# Patient Record
Sex: Female | Born: 1960
Health system: Southern US, Community
[De-identification: ages and names within clinical notes are randomized; demographics above are authoritative.]

## PROBLEM LIST (undated history)

## (undated) DIAGNOSIS — J45909 Unspecified asthma, uncomplicated: Secondary | ICD-10-CM

## (undated) DIAGNOSIS — J40 Bronchitis, not specified as acute or chronic: Secondary | ICD-10-CM

## (undated) DIAGNOSIS — J302 Other seasonal allergic rhinitis: Secondary | ICD-10-CM

## (undated) DIAGNOSIS — G43909 Migraine, unspecified, not intractable, without status migrainosus: Secondary | ICD-10-CM

## (undated) HISTORY — DX: Bronchitis, not specified as acute or chronic: J40

## (undated) HISTORY — DX: Other seasonal allergic rhinitis: J30.2

## (undated) HISTORY — DX: Unspecified asthma, uncomplicated: J45.909

## (undated) HISTORY — DX: Migraine, unspecified, not intractable, without status migrainosus: G43.909

---

## 1989-10-16 HISTORY — PX: LAPAROSCOPIC CHOLECYSTECTOMY: SUR755

## 1998-07-01 ENCOUNTER — Emergency Department (HOSPITAL_COMMUNITY): Admission: EM | Admit: 1998-07-01 | Discharge: 1998-07-01 | Payer: Self-pay | Admitting: Emergency Medicine

## 2000-07-30 ENCOUNTER — Emergency Department (HOSPITAL_COMMUNITY): Admission: EM | Admit: 2000-07-30 | Discharge: 2000-07-31 | Payer: Self-pay | Admitting: *Deleted

## 2000-10-16 HISTORY — PX: TUBAL LIGATION: SHX77

## 2004-05-30 ENCOUNTER — Emergency Department (HOSPITAL_COMMUNITY): Admission: EM | Admit: 2004-05-30 | Discharge: 2004-05-30 | Payer: Self-pay | Admitting: Emergency Medicine

## 2004-11-23 ENCOUNTER — Ambulatory Visit: Payer: Self-pay | Admitting: Obstetrics and Gynecology

## 2004-12-27 ENCOUNTER — Ambulatory Visit: Payer: Self-pay | Admitting: Family Medicine

## 2005-04-12 ENCOUNTER — Ambulatory Visit: Payer: Self-pay | Admitting: Family Medicine

## 2005-05-08 ENCOUNTER — Ambulatory Visit: Payer: Self-pay | Admitting: Family Medicine

## 2005-10-17 ENCOUNTER — Ambulatory Visit: Payer: Self-pay | Admitting: Family Medicine

## 2005-10-31 ENCOUNTER — Emergency Department (HOSPITAL_COMMUNITY): Admission: EM | Admit: 2005-10-31 | Discharge: 2005-10-31 | Payer: Self-pay | Admitting: Family Medicine

## 2005-11-20 ENCOUNTER — Ambulatory Visit: Payer: Self-pay | Admitting: Family Medicine

## 2006-02-12 ENCOUNTER — Ambulatory Visit: Payer: Self-pay | Admitting: Family Medicine

## 2006-02-22 ENCOUNTER — Ambulatory Visit: Payer: Self-pay | Admitting: Obstetrics and Gynecology

## 2006-06-14 ENCOUNTER — Ambulatory Visit: Payer: Self-pay | Admitting: Family Medicine

## 2006-06-18 ENCOUNTER — Emergency Department: Payer: Self-pay | Admitting: Emergency Medicine

## 2006-11-06 ENCOUNTER — Ambulatory Visit: Payer: Self-pay | Admitting: Internal Medicine

## 2006-12-18 ENCOUNTER — Ambulatory Visit: Payer: Self-pay | Admitting: Family Medicine

## 2007-05-10 DIAGNOSIS — G43909 Migraine, unspecified, not intractable, without status migrainosus: Secondary | ICD-10-CM | POA: Insufficient documentation

## 2007-05-16 ENCOUNTER — Ambulatory Visit: Payer: Self-pay | Admitting: Family Medicine

## 2007-05-16 DIAGNOSIS — E669 Obesity, unspecified: Secondary | ICD-10-CM | POA: Insufficient documentation

## 2007-05-16 DIAGNOSIS — M545 Low back pain, unspecified: Secondary | ICD-10-CM | POA: Insufficient documentation

## 2007-05-16 DIAGNOSIS — F411 Generalized anxiety disorder: Secondary | ICD-10-CM | POA: Insufficient documentation

## 2007-05-21 LAB — CONVERTED CEMR LAB
BUN: 15 mg/dL (ref 6–23)
Chloride: 106 meq/L (ref 96–112)
Cholesterol: 176 mg/dL (ref 0–200)
Creatinine, Ser: 1 mg/dL (ref 0.4–1.2)
LDL Cholesterol: 120 mg/dL — ABNORMAL HIGH (ref 0–99)
Sodium: 140 meq/L (ref 135–145)
Total CHOL/HDL Ratio: 6.2
Triglycerides: 138 mg/dL (ref 0–149)
VLDL: 28 mg/dL (ref 0–40)

## 2008-03-10 ENCOUNTER — Ambulatory Visit: Payer: Self-pay | Admitting: Family Medicine

## 2008-03-10 DIAGNOSIS — S93409A Sprain of unspecified ligament of unspecified ankle, initial encounter: Secondary | ICD-10-CM | POA: Insufficient documentation

## 2008-05-13 ENCOUNTER — Ambulatory Visit: Payer: Self-pay | Admitting: Obstetrics and Gynecology

## 2008-05-25 ENCOUNTER — Telehealth (INDEPENDENT_AMBULATORY_CARE_PROVIDER_SITE_OTHER): Payer: Self-pay | Admitting: Internal Medicine

## 2008-05-25 ENCOUNTER — Ambulatory Visit: Payer: Self-pay | Admitting: Obstetrics and Gynecology

## 2009-01-29 ENCOUNTER — Encounter (INDEPENDENT_AMBULATORY_CARE_PROVIDER_SITE_OTHER): Payer: Self-pay | Admitting: Internal Medicine

## 2009-01-29 ENCOUNTER — Ambulatory Visit: Payer: Self-pay | Admitting: Family Medicine

## 2009-01-29 ENCOUNTER — Encounter: Payer: Self-pay | Admitting: Family Medicine

## 2009-01-29 DIAGNOSIS — L989 Disorder of the skin and subcutaneous tissue, unspecified: Secondary | ICD-10-CM | POA: Insufficient documentation

## 2009-01-29 DIAGNOSIS — L919 Hypertrophic disorder of the skin, unspecified: Secondary | ICD-10-CM

## 2009-01-29 DIAGNOSIS — L909 Atrophic disorder of skin, unspecified: Secondary | ICD-10-CM | POA: Insufficient documentation

## 2009-04-13 ENCOUNTER — Ambulatory Visit: Payer: Self-pay | Admitting: Family Medicine

## 2009-04-13 DIAGNOSIS — N3946 Mixed incontinence: Secondary | ICD-10-CM | POA: Insufficient documentation

## 2009-04-14 LAB — CONVERTED CEMR LAB
ALT: 22 units/L (ref 0–35)
Albumin: 3.8 g/dL (ref 3.5–5.2)
Alkaline Phosphatase: 50 units/L (ref 39–117)
BUN: 15 mg/dL (ref 6–23)
Basophils Relative: 0.3 % (ref 0.0–3.0)
Bilirubin, Direct: 0 mg/dL (ref 0.0–0.3)
CO2: 30 meq/L (ref 19–32)
Calcium: 9.3 mg/dL (ref 8.4–10.5)
Cholesterol: 166 mg/dL (ref 0–200)
Creatinine, Ser: 0.9 mg/dL (ref 0.4–1.2)
Eosinophils Relative: 4.2 % (ref 0.0–5.0)
GFR calc non Af Amer: 71.44 mL/min (ref >60)
Glucose, Bld: 93 mg/dL (ref 70–99)
HCT: 41.2 % (ref 36.0–46.0)
Hemoglobin: 14.3 g/dL (ref 12.0–15.0)
Lymphs Abs: 2.4 10*3/uL (ref 0.7–4.0)
MCHC: 34.7 g/dL (ref 30.0–36.0)
MCV: 94.1 fL (ref 78.0–100.0)
Monocytes Absolute: 0.9 10*3/uL (ref 0.1–1.0)
Neutrophils Relative %: 58.1 % (ref 43.0–77.0)
RBC: 4.38 M/uL (ref 3.87–5.11)
Sodium: 141 meq/L (ref 135–145)
Total Bilirubin: 0.8 mg/dL (ref 0.3–1.2)
Triglycerides: 148 mg/dL (ref 0.0–149.0)

## 2009-04-22 ENCOUNTER — Telehealth (INDEPENDENT_AMBULATORY_CARE_PROVIDER_SITE_OTHER): Payer: Self-pay | Admitting: Internal Medicine

## 2009-06-10 LAB — CONVERTED CEMR LAB: Pap Smear: NORMAL

## 2009-07-09 ENCOUNTER — Ambulatory Visit: Payer: Self-pay | Admitting: Family Medicine

## 2009-07-09 DIAGNOSIS — J4541 Moderate persistent asthma with (acute) exacerbation: Secondary | ICD-10-CM | POA: Insufficient documentation

## 2009-07-29 ENCOUNTER — Ambulatory Visit: Payer: Self-pay | Admitting: Obstetrics and Gynecology

## 2009-08-12 ENCOUNTER — Ambulatory Visit: Payer: Self-pay | Admitting: Obstetrics and Gynecology

## 2009-08-13 ENCOUNTER — Ambulatory Visit: Payer: Self-pay | Admitting: Obstetrics and Gynecology

## 2009-08-17 ENCOUNTER — Ambulatory Visit: Payer: Self-pay | Admitting: Family Medicine

## 2009-08-20 ENCOUNTER — Encounter (INDEPENDENT_AMBULATORY_CARE_PROVIDER_SITE_OTHER): Payer: Self-pay | Admitting: Internal Medicine

## 2009-11-19 ENCOUNTER — Telehealth (INDEPENDENT_AMBULATORY_CARE_PROVIDER_SITE_OTHER): Payer: Self-pay

## 2010-04-07 ENCOUNTER — Telehealth (INDEPENDENT_AMBULATORY_CARE_PROVIDER_SITE_OTHER): Payer: Self-pay | Admitting: *Deleted

## 2010-04-08 ENCOUNTER — Ambulatory Visit: Payer: Self-pay | Admitting: Family Medicine

## 2010-04-08 LAB — CONVERTED CEMR LAB
BUN: 19 mg/dL (ref 6–23)
CO2: 28 meq/L (ref 19–32)
Calcium: 9.1 mg/dL (ref 8.4–10.5)
Cholesterol: 182 mg/dL (ref 0–200)
Creatinine, Ser: 0.8 mg/dL (ref 0.4–1.2)
GFR calc non Af Amer: 81.49 mL/min (ref >60)
Glucose, Bld: 90 mg/dL (ref 70–99)
HDL: 40.9 mg/dL (ref >39.00)
Sodium: 142 meq/L (ref 135–145)
Total CHOL/HDL Ratio: 4
VLDL: 28.8 mg/dL (ref 0.0–40.0)

## 2010-04-11 ENCOUNTER — Ambulatory Visit: Payer: Self-pay | Admitting: Family Medicine

## 2010-08-17 ENCOUNTER — Ambulatory Visit: Payer: Self-pay | Admitting: Family Medicine

## 2010-08-17 DIAGNOSIS — M79609 Pain in unspecified limb: Secondary | ICD-10-CM | POA: Insufficient documentation

## 2010-10-06 ENCOUNTER — Telehealth: Payer: Self-pay | Admitting: Family Medicine

## 2010-10-18 ENCOUNTER — Encounter: Payer: Self-pay | Admitting: Family Medicine

## 2010-11-15 NOTE — Assessment & Plan Note (Signed)
Summary: R LEG PAIN,BRUISED/CLE   Vital Signs:  Patient profile:   50 year old female Height:      65 inches Weight:      225.50 pounds BMI:     37.66 Temp:     98.7 degrees F oral Pulse rate:   76 / minute Pulse rhythm:   regular BP sitting:   102 / 70  (right arm) Cuff size:   large  Vitals Entered By: Linde Gillis CMA Duncan Dull) (August 17, 2010 3:33 PM) CC: right leg pain due to a fall, bruised   History of Present Illness: "I fell".  Saturday AM  ~6:30.  Was leaving a service station and when to ground.  Pain started at lunchtime after walking for a while.  No LOC.  Remembers the fall.  Seen at [facility] in [facility]  Pain in R hip and down R leg.  H/o sciatica and this is similar to before.  Can walk but with pain.  No weakness. Was xrays at clinic in [facility] but I don't have records from that.  Had been using vicodin.  1 didn't help but 2 makes her sleepy.    In school at [employer].  Needs note.   Allergies: 1)  ! Codeine Sulfate (Codeine Sulfate)  Review of Systems       See HPI.  Otherwise negative.    Physical Exam  General:  NAD RRR CTAB back w/o midline pain.  R lower lumbar paraspinal area tender to palpation R SLR pos but distally nv intact R knee puffy with bruising but ligamentously intact.  diffuse bruising on R shin but not tender to palpation  no weakness on testing R leg.    Impression & Recommendations:  Problem # 1:  LEG PAIN, RIGHT (ICD-729.5) Likely sciatica from fall/compensation and concurrent bruising/patellar bursitis.  D/w patient re: cont crutches and note given.  Ambulate as pain allows.  use 1.5 vicodin and follow up as needed.  Bruising will likely extend into the foot due to gravity over the next few days.  patient informed and understands.   Complete Medication List: 1)  Imitrex 100 Mg Tabs (Sumatriptan succinate) .... Take one by mouth as needed 2)  Zoloft 50 Mg Tabs (Sertraline hcl) .Marland Kitchen.. 1daily by mouth 3)  Oxybutynin Chloride 5 Mg Tabs  (Oxybutynin chloride) .... Take 1 two times a day for urinary incontinence 4)  Proventil Hfa 108 (90 Base) Mcg/act Aers (Albuterol sulfate) .... Two puffs as needed 5)  Proair Hfa 108 (90 Base) Mcg/act Aers (Albuterol sulfate) .Marland Kitchen.. 1-2 puffs every 4 hrs as needed wheezing or chest tightness 6)  Flovent Diskus 100 Mcg/blist Aepb (Fluticasone propionate (inhal)) .Marland Kitchen.. 1 inh twice daily. 7)  Vicodin 5-500 Mg Tabs (Hydrocodone-acetaminophen) .... 1.5 tabs by mouth three times a day as needed for pain  Patient Instructions: 1)  Take the vicodin (1.5 tabs) three times a day for pain.  It can may you drowsy.  This should gradually get better.  Gradually wean off the crutches.  Take care.  Prescriptions: VICODIN 5-500 MG TABS (HYDROCODONE-ACETAMINOPHEN) 1.5 tabs by mouth three times a day as needed for pain  #30 x 1   Entered and Authorized by:   Crawford Givens MD   Signed by:   Crawford Givens MD on 08/17/2010   Method used:   Print then Give to Patient   RxID:   [id number]    Orders Added: 1)  Est. Patient Level III [308[address]]    Current Allergies (  reviewed today): ! CODEINE SULFATE (CODEINE SULFATE)

## 2010-11-15 NOTE — Letter (Signed)
Summary: Out of School  [place] at [facility]  [address] [place], [place] [address]   Phone: [phone]  Fax: [phone]    August 17, 2010   Student:  ALEAN KROMER    To Whom It May Concern:   For Medical reasons, please all Rahaf Eastham to use crutches.  Please allow for extra time between classes as needed.    If you need additional information, please feel free to contact our office.   Sincerely,    Crawford Givens MD    ****This is a legal document and cannot be tampered with.  Schools are authorized to verify all information and to do so accordingly.

## 2010-11-15 NOTE — Progress Notes (Signed)
----   Converted from flag ---- ---- 04/07/2010 11:41 AM, Ruthe Mannan MD wrote: BMET (v70.0), fasting lipid panel (v77.91)  ---- 04/07/2010 11:12 AM, Mills Koller wrote: Patient scheduled for cpx labs, Thanks, Terri ( Billie's pt) ------------------------------

## 2010-11-15 NOTE — Progress Notes (Signed)
Summary: head and sinus congestion  Phone Note Call from Patient Call back at [phone]   Caller: Patient Call For: Dr Dayton Martes Summary of Call: On 11/18/09 pt began with sinus and head congestion, dry cough, no fever, no SOB, no wheezing or no trouble getting breath. Pt said Sudafed  had not helped in the past. Pt has taken Chlortrimeton before and that seemed to help. Pt will try Chlortrimeton, Robitussin, and a cool vapor humidifier. Pt will encourage fluids and rest. Pt is eating and drinking normally. If condition changes or worsens will call back and info on [facility] given if needed. Pt was comfortable with this plan of action at this time. Initial call taken by: Lewanda Rife LPN,  November 19, 2009 1:12 PM

## 2010-11-15 NOTE — Assessment & Plan Note (Signed)
Summary: CPX /[facility] BILLIES PATIENT/[facility]   Vital Signs:  Patient profile:   50 year old female Height:      65 inches Weight:      218 pounds BMI:     36.41 Temp:     98.6 degrees F oral Pulse rate:   72 / minute Pulse rhythm:   regular BP sitting:   104 / 70  (right arm) Cuff size:   large  Vitals Entered By: Lowella Petties CMA (April 11, 2010 8:32 AM) CC: Transfer from [place]- physical   History of Present Illness: 50 yo here for CPX.  Yearly Pap and mammogram done with Dr. Logan Bores, [facility]. Due at the end of next month.  Lipid panel this week was good.  She is trying to walk at least once a day.  Asthma- has deteriorated.  Now having to use her rescue inhaler 3-4 times a day.  Was on an inhaled steroid years ago.  She feels her symptoms get worse this time of year, every year.  Asthma has been deteriorating for last several months. No fevers, chest pain or sputum production. Wheezing throughout the day, coughing at night.  mixed urinary incontinence- much improved with oxybutynin 5 mg two times a day.    Current Medications (verified): 1)  Imitrex 100 Mg  Tabs (Sumatriptan Succinate) .... Take One By Mouth As Needed 2)  Zoloft 50 Mg  Tabs (Sertraline Hcl) .Marland Kitchen.. 1daily By Mouth 3)  Oxybutynin Chloride 5 Mg Tabs (Oxybutynin Chloride) .... Take 1 Two Times A Day For Urinary Incontinence 4)  Proventil Hfa 108 (90 Base) Mcg/act Aers (Albuterol Sulfate) .... Two Puffs As Needed 5)  Proair Hfa 108 (90 Base) Mcg/act Aers (Albuterol Sulfate) .Marland Kitchen.. 1-2 Puffs Every 4 Hrs As Needed Wheezing or Chest Tightness 6)  Flovent Diskus 100 Mcg/blist Aepb (Fluticasone Propionate (Inhal)) .Marland Kitchen.. 1 Inh Twice Daily.  Allergies (verified): 1)  ! Codeine Sulfate (Codeine Sulfate)  Past History:  Family History: Last updated: 05/16/2007 Father: Alive ? Mother: Alive, Elevated BP, Diverticulitis, new dx 7/08--fibromyalgia, and esophagus problem Siblings: Two stepbrothers, L & W, one full sister- L  & W- uterine cancer MGM died at age 46 MI, asthma, emphysema (smoked) MGF alive L & W PG parents not known CV- Mat. 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 Eyes:  Denies blurring. ENT:  Denies difficulty swallowing. CV:  Denies chest pain or discomfort and palpitations. Resp:  Complains of cough and wheezing; denies sputum productive. GI:  Denies abdominal pain, bloody stools, and change in  bowel habits. GU:  Denies abnormal vaginal bleeding, discharge, dysuria, urinary frequency, and urinary hesitancy. MS:  Denies joint pain, joint redness, and joint swelling. Derm:  Denies rash. Neuro:  Denies headaches. Psych:  Denies anxiety, depression, sense of great danger, suicidal thoughts/plans, thoughts of violence, unusual visions or sounds, and thoughts /plans of harming others. Endo:  Denies cold intolerance and heat intolerance. Heme:  Denies abnormal bruising and bleeding. Allergy:  Denies persistent infections.  Physical Exam  General:  alert, well-developed, well-nourished, well-hydrated, and overweight-appearing.  NAD sitting in exam room Head:  normocephalic.   Eyes:  pupils equal, pupils round, and no injection.   Ears:  External ear exam shows no significant lesions or deformities.  Otoscopic examination reveals clear canals, tympanic  membranes are intact bilaterally without bulging, retraction, inflammation or discharge. Hearing is grossly normal bilaterally. Nose:  External nasal examination shows no deformity or inflammation. Nasal mucosa are pink and moist without lesions or exudates. Mouth:  Oral mucosa and oropharynx without lesions or exudates.  Teeth in good repair. Neck:  no masses, no thyromegaly, no JVD, and no carotid bruits.   Lungs:  scattered wheezes throughout lung fields--move wiht cough--non-productive, wheezes heard at end of inspiration Heart:  normal rate, regular rhythm, and no murmur.   Abdomen:  obese, soft, non-tender, normal bowel sounds, no distention, no masses, no guarding, no abdominal hernia, no inguinal hernia, no hepatomegaly, and no splenomegaly.   Genitalia:  deferred Msk:  no joint swelling, no joint warmth, no redness over joints, and no joint deformities.   Extremities:  no pretibial edema bilat Neurologic:  alert & oriented X3 and gait normal.   Skin:  turgor normal, color normal, and no rashes.   Psych:  normally interactive and good eye contact.     Impression & Recommendations:  Problem # 1:  WELL WOMAN (ICD-V70.0) Reviewed preventive care protocols, scheduled due services, and updated immunizations Discussed nutrition, exercise, diet, and healthy lifestyle.  UTD on prevention.  Problem # 2:  ASTHMA (ICD-493.90) Assessment: Deteriorated Will add flovent to her rescue inhaler.  Pt to call me in 2-3 weeks (she is going out of town) with an update of how she is feeling. Her updated medication list for this problem includes:    Proventil Hfa 108 (90 Base) Mcg/act Aers (Albuterol sulfate) .Marland Kitchen..Marland Kitchen Two puffs as needed    Proair Hfa 108 (90 Base) Mcg/act Aers (Albuterol sulfate) .Marland Kitchen... 1-2 puffs every 4 hrs as needed wheezing or chest tightness    Flovent Diskus 100 Mcg/blist Aepb (Fluticasone propionate (inhal)) .Marland Kitchen... 1 inh twice daily.  Complete Medication List: 1)  Imitrex 100 Mg  Tabs (Sumatriptan succinate) .... Take one by mouth as needed 2)  Zoloft 50 Mg Tabs (Sertraline hcl) .Marland Kitchen.. 1daily by mouth 3)  Oxybutynin Chloride 5 Mg Tabs (Oxybutynin chloride) .... Take 1 two times a day for urinary incontinence 4)  Proventil Hfa 108 (90 Base) Mcg/act Aers (Albuterol sulfate) .... Two puffs as needed 5)  Proair Hfa 108 (90 Base) Mcg/act Aers (Albuterol sulfate) .Marland Kitchen.. 1-2 puffs every 4 hrs as needed wheezing or chest tightness 6)  Flovent Diskus 100 Mcg/blist Aepb (Fluticasone propionate (inhal)) .Marland Kitchen.. 1 inh twice daily.  Patient Instructions: 1)  Great to meet you. 2)  Let me know how you are doing with the flovent. Prescriptions: FLOVENT DISKUS 100 MCG/BLIST AEPB (FLUTICASONE PROPIONATE (INHAL)) 1 inh twice daily.  #11 x 0   Entered and Authorized by:   Ruthe Mannan MD   Signed  by:   Ruthe Mannan MD on 04/11/2010   Method used:   Samples Given   RxID:   [id number] OXYBUTYNIN CHLORIDE 5 MG TABS (OXYBUTYNIN CHLORIDE) take 1 two times a day for urinary incontinence  #60 x 12   Entered and Authorized by:   Ruthe Mannan MD   Signed by:   Ruthe Mannan MD on 04/11/2010   Method used:   Electronically to        [facility]* (retail)       [address]       [place], [place]  [address]       Ph: [phone]       Fax: [phone]   RxID:   [id number] ZOLOFT 50 MG  TABS (SERTRALINE HCL) 1daily by mouth  #30 x 11   Entered and Authorized by:   Ruthe Mannan MD   Signed by:   Ruthe Mannan MD on 04/11/2010   Method used:   Electronically to        [facility]* (retail)       [address]       [place], [place]  [address]       Ph: [phone]       Fax: [phone]   RxID:   [id number]   Prior Medications: IMITREX 100 MG  TABS (SUMATRIPTAN SUCCINATE) Take one by mouth as needed ZOLOFT 50 MG  TABS (SERTRALINE HCL) 1daily by mouth OXYBUTYNIN CHLORIDE 5 MG TABS (OXYBUTYNIN CHLORIDE) take 1 two times a day for urinary incontinence PROVENTIL HFA 108 (90 BASE)  MCG/ACT AERS (ALBUTEROL SULFATE) two puffs as needed PROAIR HFA 108 (90 BASE) MCG/ACT AERS (ALBUTEROL SULFATE) 1-2 puffs every 4 hrs as needed wheezing or chest tightness Current Allergies (reviewed today): ! CODEINE SULFATE (CODEINE SULFATE)  PAP Result Date:  06/10/2009 PAP Result:  normal PAP Next Due:  1 yr Mammogram Result Date:  06/10/2009 Mammogram Result:  normal Mammogram Next Due:  1 yr

## 2010-11-17 NOTE — Miscellaneous (Signed)
Summary: Dose change in OXYBUTYNIN  Clinical Lists Changes  Medications: Removed medication of OXYBUTYNIN CHLORIDE 5 MG TABS (OXYBUTYNIN CHLORIDE) take 1 two times a day for urinary incontinence Added new medication of OXYBUTYNIN CHLORIDE 10 MG XR24H-TAB (OXYBUTYNIN CHLORIDE) take 1 by mouth two times a day - Signed Rx of OXYBUTYNIN CHLORIDE 10 MG XR24H-TAB (OXYBUTYNIN CHLORIDE) take 1 by mouth two times a day;  #60 x 12;  Signed;  Entered by: Mervin Hack CMA (AAMA);  Authorized by: Ruthe Mannan MD;  Method used: Electronically to [facility]*, [address], [place], [place]  [address], Ph: [phone], Fax: [phone]    Prescriptions: OXYBUTYNIN CHLORIDE 10 MG XR24H-TAB (OXYBUTYNIN CHLORIDE) take 1 by mouth two times a day  #60 x 12   Entered by:   Mervin Hack CMA (AAMA)   Authorized by:   Ruthe Mannan MD   Signed by:   Mervin Hack CMA (AAMA) on 10/18/2010   Method used:   Electronically to        [facility]* (retail)       [address]       [place], [place]  [address]       Ph: [phone]       Fax: [phone]   RxID:   [id number]

## 2010-11-17 NOTE — Progress Notes (Signed)
Summary: wants to change from oxybutynin  Phone Note Refill Request Message from:  Fax from Pharmacy  Refills Requested: Medication #1:  OXYBUTYNIN CHLORIDE 5 MG TABS take 1 two times a day for urinary incontinence Fax from [facility].  Pt says this is not working and she wants to change to something else.  Initial call taken by: Lowella Petties CMA, AAMA,  October 06, 2010 10:17 AM  Follow-up for Phone Call        She is still on a rather low dose, max is 20 mg per day.  This is as efficacious as other medications.  I would recommend increasing the dose to 10 mg two times a day and referring to urology for further treatment options. Ruthe Mannan MD  October 06, 2010 12:08 PM   Additional Follow-up for Phone Call Additional follow up Details #1::        Advised pt.  She will try this and see how it works. Additional Follow-up by: Lowella Petties CMA, AAMA,  October 06, 2010 12:44 PM

## 2010-11-18 ENCOUNTER — Ambulatory Visit (INDEPENDENT_AMBULATORY_CARE_PROVIDER_SITE_OTHER): Payer: Commercial Indemnity | Admitting: Family Medicine

## 2010-11-18 ENCOUNTER — Encounter: Payer: Self-pay | Admitting: Family Medicine

## 2010-11-18 ENCOUNTER — Ambulatory Visit: Payer: Self-pay | Admitting: Family Medicine

## 2010-11-18 DIAGNOSIS — M79609 Pain in unspecified limb: Secondary | ICD-10-CM

## 2010-11-18 DIAGNOSIS — J45909 Unspecified asthma, uncomplicated: Secondary | ICD-10-CM

## 2010-11-23 NOTE — Assessment & Plan Note (Signed)
Summary: COUGH/CLE   Vital Signs:  Patient profile:   50 year old female Height:      65 inches Weight:      222.50 pounds BMI:     37.16 O2 Sat:      98 % on Room air Temp:     98 degrees F oral Pulse rate:   84 / minute Pulse rhythm:   regular BP sitting:   102 / 72  (left arm) Cuff size:   large  Vitals Entered By: Delilah Shan CMA (AAMA) (November 18, 2010 8:07 AM)  O2 Flow:  Room air CC: Cough   History of Present Illness: H/o asthma.  Noted changed in weather.  During that time cough has increased.  No fevers.  Cough and wheeze worse at night and early AM.  "I don't feel bad except for the cough."  3 weeks duration.  Inc in SABA use (5-6 time a day).  Still using flovent two times a day.  Needs SABA more with exercise.  Nonsmoker.  No ear pain. No rhinorrhea.    If sitting a long time, pain on back on R leg that radiates down.  Recently start of symptoms.  "Lightning bolt."  Better when up and walking.  No recent inury except for a fall months ago.  Sx didn't start at that time.  No weakness in leg.      Allergies: 1)  ! Codeine Sulfate (Codeine Sulfate)  Review of Systems       See HPI.  Otherwise negative.    Physical Exam  General:  no apparent distress normocephalic atraumatic tm wnl x2 nasal and OP exam e/o erythema neck supple regular rate and rhythm clear to auscultation bilaterally, no wheeze, no increase in wob, can have a conversation w/o any effort back w/o midline pain R slr pos, no weakness, dtrs wnl bilaterally   Impression & Recommendations:  Problem # 1:  ASTHMA (ICD-493.90) Peak flow 270 but this was with poor technique, even with coaching.  I don't think this approximates her status- she would very much likely be  higher with better technique.  I would start prednisone, GI and steroid cautions given.  Continue inhalers and follow up as needed.  She agrees.  The following medications were removed from the medication list:    Proventil Hfa  108 (90 Base) Mcg/act Aers (Albuterol sulfate) .Marland Kitchen..Marland Kitchen Two puffs as needed Her updated medication list for this problem includes:    Proair Hfa 108 (90 Base) Mcg/act Aers (Albuterol sulfate) .Marland Kitchen... 1-2 puffs every 4 hrs as needed wheezing or chest tightness    Flovent Diskus 100 Mcg/blist Aepb (Fluticasone propionate (inhal)) .Marland Kitchen... 1 inh twice daily.    Prednisone (pak) 5 Mg Tabs (Prednisone) .Marland Kitchen... 12 day single strength dose pack, taper as directed, take with food.  Orders: Prescription Created Electronically [phone])  Problem # 2:  LEG PAIN, RIGHT (ICD-729.5) Sciatica.  D/w patient re anatomy and stretches.  Handout given and the prednisone should help.  follow up as needed. She understood.  No weakness.   Complete Medication List: 1)  Imitrex 100 Mg Tabs (Sumatriptan succinate) .... Take one by mouth as needed 2)  Zoloft 50 Mg Tabs (Sertraline hcl) .Marland Kitchen.. 1daily by mouth 3)  Proair Hfa 108 (90 Base) Mcg/act Aers (Albuterol sulfate) .Marland Kitchen.. 1-2 puffs every 4 hrs as needed wheezing or chest tightness 4)  Flovent Diskus 100 Mcg/blist Aepb (Fluticasone propionate (inhal)) .Marland Kitchen.. 1 inh twice daily. 5)  Oxybutynin Chloride 10  Mg Xr24h-tab (Oxybutynin chloride) .... Take 1 by mouth two times a day 6)  Prednisone (pak) 5 Mg Tabs (Prednisone) .Marland Kitchen.. 12 day single strength dose pack, taper as directed, take with food.  Patient Instructions: 1)  Start the prednisone today.  Take it with food.  Use your proair as needed and keep using the flovent.  If you continue to need the proair inhaler more frequently than normal, notify us.  Work on the back exercises and let us know if you aren't improving.  Take care.  Prescriptions: PREDNISONE (PAK) 5 MG TABS (PREDNISONE) 12 day single strength dose pack, taper as directed, take with food.  #1 pack x 0   Entered and Authorized by:   Crawford Givens MD   Signed by:   Crawford Givens MD on 11/18/2010   Method used:   Electronically to        [facility]* (retail)        [address]       [place], [place]  [address]       Ph: [phone]       Fax: [phone]   RxID:   [id number]    Orders Added: 1)  Est. Patient Level III [84132] 2)  Prescription Created Electronically [phone]    Current Allergies (reviewed today): ! CODEINE SULFATE (CODEINE SULFATE)

## 2011-02-23 ENCOUNTER — Telehealth: Payer: Self-pay | Admitting: *Deleted

## 2011-02-23 NOTE — Telephone Encounter (Signed)
Patient has changes insurances and her copay for her proair inhaler has gone up to $50.00 and she can not afford it. She is asking if she can switch to something cheaper. I spoke with rob from [facility] and he says that the closest thing that she could use that would be cheaper for her with her insurance is the ventolin hfa. Please advise. Uses [facility].

## 2011-02-24 MED ORDER — ALBUTEROL SULFATE HFA 108 (90 BASE) MCG/ACT IN AERS: 2.0000 | INHALATION_SPRAY | Freq: Four times a day (QID) | RESPIRATORY_TRACT | Status: DC | PRN

## 2011-02-24 NOTE — Telephone Encounter (Signed)
Yes ok to change to ventolin.   I will send rx.

## 2011-03-20 ENCOUNTER — Encounter: Payer: Self-pay | Admitting: Family Medicine

## 2011-03-20 ENCOUNTER — Encounter: Payer: Commercial Indemnity | Admitting: Family Medicine

## 2011-03-20 LAB — HM MAMMOGRAPHY

## 2011-03-21 ENCOUNTER — Ambulatory Visit: Payer: Commercial Indemnity | Admitting: Family Medicine

## 2011-03-27 NOTE — Progress Notes (Signed)
  Subjective:    Patient ID: Jamie Rowland, female    DOB: 10/07/1961, 50 y.o.   MRN: [id number]  HPI No show   Review of Systems     Objective:   Physical Exam        Assessment & Plan:

## 2011-04-21 ENCOUNTER — Other Ambulatory Visit: Payer: Self-pay | Admitting: *Deleted

## 2011-04-21 MED ORDER — SERTRALINE HCL 50 MG PO TABS: 50.0000 mg | ORAL_TABLET | Freq: Every day | ORAL | Status: DC

## 2011-04-21 NOTE — Telephone Encounter (Signed)
Rx called to [facility]. 

## 2011-04-27 ENCOUNTER — Encounter: Payer: Self-pay | Admitting: Family Medicine

## 2011-04-27 ENCOUNTER — Ambulatory Visit (INDEPENDENT_AMBULATORY_CARE_PROVIDER_SITE_OTHER)
Admission: RE | Admit: 2011-04-27 | Discharge: 2011-04-27 | Disposition: A | Discharge status: Home / Self Care | Payer: BC Managed Care – PPO | Source: Ambulatory Visit | Attending: Family Medicine | Admitting: Family Medicine

## 2011-04-27 ENCOUNTER — Ambulatory Visit (INDEPENDENT_AMBULATORY_CARE_PROVIDER_SITE_OTHER): Payer: BC Managed Care – PPO | Admitting: Family Medicine

## 2011-04-27 VITALS — BP 110/68 | HR 87 | Temp 98.6°F | Ht 65.5 in | Wt 216.4 lb

## 2011-04-27 DIAGNOSIS — M25561 Pain in right knee: Secondary | ICD-10-CM

## 2011-04-27 DIAGNOSIS — M222X9 Patellofemoral disorders, unspecified knee: Secondary | ICD-10-CM

## 2011-04-27 DIAGNOSIS — M2391 Unspecified internal derangement of right knee: Secondary | ICD-10-CM

## 2011-04-27 DIAGNOSIS — M25569 Pain in unspecified knee: Secondary | ICD-10-CM

## 2011-04-27 DIAGNOSIS — M2390 Unspecified internal derangement of unspecified knee: Secondary | ICD-10-CM

## 2011-04-27 NOTE — Progress Notes (Signed)
Jamie Rowland, a 50 y.o. female presents today in the office for the following:  Pleasant lady who presents with a story after falling and twisting her knee and striking her right knee in approximately October or November, 2011. At that time she reports it immediately swelled. She ultimately developed a significant amount of bruising, and had a limp for some time. She has had continuous pain since that time, now she has pain with flexion of the knee and with rotational motions.  Currently, she has no significant swelling. This does cause some functional limitation to her, she takes care of 4 children at home.  No mechanical locking up. Positive symptomatic giving way. No prior operative interventions in the affected knee. She has tried multiple anti-inflammatories, Tylenol, heat and ice, without any significant relief symptoms.  Patient Active Problem List  Diagnoses  . OBESITY NOS  . ANXIETY DISORDER  . MIGRAINE HEADACHE  . ASTHMA  . URINARY INCONTINENCE, MIXED  . Right knee pain   No past medical history on file. No past surgical history on file. History  Substance Use Topics  . Smoking status: Never Smoker   . Smokeless tobacco: Not on file  . Alcohol Use: Not on file   Family History  Problem Relation Age of Onset  . Hypertension Mother   . Diverticulitis Mother   . Fibromyalgia Mother   . Cancer Sister     uterine  . Diabetes Maternal Uncle   . Heart disease Maternal Uncle   . Asthma Maternal Grandmother   . Heart disease Maternal Grandmother   . Emphysema Maternal Grandmother    Allergies  Allergen Reactions  . Codeine Sulfate     REACTION: Hives, can't breathe   Current Outpatient Prescriptions on File Prior to Visit  Medication Sig Dispense Refill  . albuterol (PROVENTIL HFA) 108 (90 BASE) MCG/ACT inhaler Inhale 2 puffs into the lungs every 6 (six) hours as needed for wheezing.  1 Inhaler  1  . Fluticasone Propionate, Inhal, (FLOVENT DISKUS) 100 MCG/BLIST AEPB  One inhalation twice daily       . oxybutynin (DITROPAN-XL) 10 MG 24 hr tablet Take 10 mg by mouth 2 (two) times daily.        . sertraline (ZOLOFT) 50 MG tablet Take 1 tablet (50 mg total) by mouth daily.  30 tablet  0  . SUMAtriptan (IMITREX) 100 MG tablet Take 100 mg by mouth as needed.         REVIEW OF SYSTEMS  GEN: No fevers, chills. Nontoxic. Primarily MSK c/o today. MSK: Detailed in the HPI GI: tolerating PO intake without difficulty Neuro: No numbness, parasthesias, or tingling associated. Otherwise the pertinent positives of the ROS are noted above.    Physical Exam  Blood pressure 110/68, pulse 87, temperature 98.6 F (37 C), temperature source Oral, height 5' 5.5" (1.664 m), weight 216 lb 6.4 oz (98.158 kg), SpO2 98.00%.  GEN: Well-developed,well-nourished,in no acute distress; alert,appropriate and cooperative throughout examination HEENT: Normocephalic and atraumatic without obvious abnormalities. Ears, externally no deformities PULM: Breathing comfortably in no respiratory distress EXT: No clubbing, cyanosis, or edema PSYCH: Normally interactive. Cooperative during the interview. Pleasant. Friendly and conversant. Not anxious or depressed appearing. Normal, full affect.  Gait: mild limp ROM: 0-105 Effusion: neg Echymosis or edema: none Patellar tendon NT Painful PLICA: neg Patellar grind: POS Medial and lateral patellar facet loading: ttp medial and lateral joint lines:MEDIALLY, MOSTLY POSTERIOR Mcmurray's POS Flexion-pinch POS BOUNCE HOME POS Varus and valgus stress: stable  Lachman: neg Ant and Post drawer: neg Hip abduction, IR, ER: WNL Hip flexion str: 5/5 Hip abd: 5/5 Quad: 5/5 VMO atrophy:No Hamstring concentric and eccentric: 5/5  Assessment and Plan: 1.  internal derangement, right knee: 2. Knee pain, right  3. Suspect meniscal pathology 4. Patella femoral pain.  Plan: Discussed various treatment options. At this point the patient would like  to proceed conservatively, given that she has 4 small children to take care of at home, which I think is reasonable. I suspected she has a meniscal tear. Will proceed and try an intra-articular injection, and have her followup in 3 weeks. At that point, she is still severely impaired, I think that consideration of arthroscopy or surgical consult is prudent.  X-rays: AP Bilateral Weight-bearing, Weightbearing Lateral, Sunrise views Indication: knee pain Findings:  No significant OA.  Knee Injection, R Patient verbally consented to procedure. Risks, benefits, and alternatives explained. Sterilely prepped with betadine. Ethyl cholride used for anesthesia. 9 cc Lidocaine 1% mixed with 1 cc of Kenalog 40 mg injected using the anterolateral approach without difficulty. No complications with procedure and tolerated well. Patient had decreased pain post-injection.

## 2011-05-18 ENCOUNTER — Telehealth: Payer: Self-pay | Admitting: *Deleted

## 2011-05-18 ENCOUNTER — Ambulatory Visit: Payer: BC Managed Care – PPO | Admitting: Family Medicine

## 2011-05-18 NOTE — Telephone Encounter (Signed)
Patient called requesting a CPX with Dr. Dayton Martes asap.  She stated that her insurance is going to end soon and needs to get in.  If there are no physical appt slots can we just put two 15 minute appts together for her?  Please advise.

## 2011-05-19 NOTE — Telephone Encounter (Signed)
Yes that is ok

## 2011-05-19 NOTE — Telephone Encounter (Signed)
Left message on voicemail for patient to return call. 

## 2011-05-22 NOTE — Telephone Encounter (Signed)
Left message on cell phone voicemail and on machine at home for patient to return call.

## 2011-05-22 NOTE — Telephone Encounter (Signed)
Spoke with patient and scheduled her for CPX tomorrow with Dr. Dayton Martes at 9:15.

## 2011-05-23 ENCOUNTER — Encounter: Payer: BC Managed Care – PPO | Admitting: Family Medicine

## 2011-05-23 DIAGNOSIS — Z0289 Encounter for other administrative examinations: Secondary | ICD-10-CM

## 2011-05-23 DIAGNOSIS — Z0000 Encounter for general adult medical examination without abnormal findings: Secondary | ICD-10-CM | POA: Insufficient documentation

## 2011-05-23 NOTE — Progress Notes (Signed)
No show

## 2011-06-01 ENCOUNTER — Other Ambulatory Visit: Payer: Self-pay

## 2011-06-01 MED ORDER — SERTRALINE HCL 50 MG PO TABS: 50.0000 mg | ORAL_TABLET | Freq: Every day | ORAL | Status: DC

## 2011-06-01 NOTE — Telephone Encounter (Signed)
[facility] faxed refill request for Sertraline HCL 50 mg.Please advise.

## 2011-06-12 ENCOUNTER — Other Ambulatory Visit: Payer: Self-pay | Admitting: *Deleted

## 2011-06-12 MED ORDER — SERTRALINE HCL 50 MG PO TABS: 50.0000 mg | ORAL_TABLET | Freq: Every day | ORAL | Status: DC

## 2011-06-12 NOTE — Telephone Encounter (Signed)
Rx called to [facility].  Rx was authorized on 06/01/2011 but never was called in.

## 2011-06-28 ENCOUNTER — Other Ambulatory Visit: Payer: Self-pay | Admitting: *Deleted

## 2011-06-28 MED ORDER — ALBUTEROL SULFATE HFA 108 (90 BASE) MCG/ACT IN AERS: 2.0000 | INHALATION_SPRAY | Freq: Four times a day (QID) | RESPIRATORY_TRACT | Status: DC | PRN

## 2011-06-28 NOTE — Telephone Encounter (Signed)
Received rx refill request for Ventolin HFA inhaler.  This medication was not listed on med list.  Please advise.

## 2011-07-12 ENCOUNTER — Encounter: Payer: Self-pay | Admitting: Family Medicine

## 2011-07-12 ENCOUNTER — Ambulatory Visit (INDEPENDENT_AMBULATORY_CARE_PROVIDER_SITE_OTHER): Payer: BC Managed Care – PPO | Admitting: Family Medicine

## 2011-07-12 VITALS — BP 120/70 | HR 90 | Temp 98.6°F | Ht 65.5 in | Wt 203.1 lb

## 2011-07-12 DIAGNOSIS — M25569 Pain in unspecified knee: Secondary | ICD-10-CM

## 2011-07-12 DIAGNOSIS — M25561 Pain in right knee: Secondary | ICD-10-CM

## 2011-07-12 DIAGNOSIS — M7120 Synovial cyst of popliteal space [Baker], unspecified knee: Secondary | ICD-10-CM

## 2011-07-12 MED ORDER — TRAMADOL HCL 50 MG PO TABS: 50.0000 mg | ORAL_TABLET | Freq: Four times a day (QID) | ORAL | Status: AC | PRN

## 2011-07-12 MED ORDER — DICLOFENAC SODIUM 75 MG PO TBEC: 75.0000 mg | DELAYED_RELEASE_TABLET | Freq: Two times a day (BID) | ORAL | Status: DC

## 2011-07-12 NOTE — Patient Instructions (Signed)
REFERRAL: GO THE THE FRONT ROOM AT THE ENTRANCE OF OUR CLINIC, NEAR CHECK IN. ASK FOR Jamie Rowland. SHE WILL HELP YOU SET UP YOUR REFERRAL. DATE: TIME:  

## 2011-07-12 NOTE — Progress Notes (Signed)
Subjective:    Patient ID: Jamie Rowland, female    DOB: 1961/06/01, 50 y.o.   MRN: [id number]  HPI  Pleasant patient who has a history of injuring her knee as detailed below. I saw her for the first time approximately 6-8 weeks ago. At that time she was having considerable amount of pain, some symptomatic giving way and pain with motion as well as rotational movements. We reviewed conservative versus aggressive treatments, and she wanted to not pursue any form of operative consult at that time. We did a intra-articular knee injection of corticosteroid. The patient improved symptomatically for about 4 weeks, and she had to do some moving of another family member, and has had some return of swelling, popliteal pain, and generalized pain in the knee particularly with bending, rotation, and some occasional buckling. No locking up.  R knee -- improved for about 4 weeks, then still hurting a lot.  Suspect meniscal tear   Prior OV: Pleasant lady who presents with a story after falling and twisting her knee and striking her right knee in approximately October or November, 2011. At that time she reports it immediately swelled. She ultimately developed a significant amount of bruising, and had a limp for some time. She has had continuous pain since that time, now she has pain with flexion of the knee and with rotational motions.  Currently, she has no significant swelling. This does cause some functional limitation to her, she takes care of 4 children at home.  No mechanical locking up. Positive symptomatic giving way. No prior operative interventions in the affected knee. She has tried multiple anti-inflammatories, Tylenol, heat and ice, without any significant relief symptoms.  Patient Active Problem List  Diagnoses  . OBESITY NOS  . ANXIETY DISORDER  . MIGRAINE HEADACHE  . ASTHMA  . URINARY INCONTINENCE, MIXED  . Right knee pain  . Routine general medical examination at a health care facility    No past medical history on file. No past surgical history on file. History  Substance Use Topics  . Smoking status: Never Smoker   . Smokeless tobacco: Not on file  . Alcohol Use: Not on file   Family History  Problem Relation Age of Onset  . Hypertension Mother   . Diverticulitis Mother   . Fibromyalgia Mother   . Cancer Sister     uterine  . Diabetes Maternal Uncle   . Heart disease Maternal Uncle   . Asthma Maternal Grandmother   . Heart disease Maternal Grandmother   . Emphysema Maternal Grandmother    Allergies  Allergen Reactions  . Codeine Sulfate     REACTION: Hives, can't breathe   Current Outpatient Prescriptions on File Prior to Visit  Medication Sig Dispense Refill  . albuterol (PROVENTIL HFA) 108 (90 BASE) MCG/ACT inhaler Inhale 2 puffs into the lungs every 6 (six) hours as needed for wheezing.  18 g  6  . Fluticasone Propionate, Inhal, (FLOVENT DISKUS) 100 MCG/BLIST AEPB One inhalation twice daily       . oxybutynin (DITROPAN-XL) 10 MG 24 hr tablet Take 10 mg by mouth 2 (two) times daily.        . sertraline (ZOLOFT) 50 MG tablet Take 1 tablet (50 mg total) by mouth daily.  30 tablet  0  . SUMAtriptan (IMITREX) 100 MG tablet Take 100 mg by mouth as needed.           Review of Systems REVIEW OF SYSTEMS  GEN: No fevers, chills. Nontoxic.  Primarily MSK c/o today. MSK: Detailed in the HPI GI: tolerating PO intake without difficulty Neuro: No numbness, parasthesias, or tingling associated. Otherwise the pertinent positives of the ROS are noted above.      Objective:   Physical Exam   Physical Exam  Blood pressure 120/70, pulse 90, temperature 98.6 F (37 C), temperature source Oral, height 5' 5.5" (1.664 m), weight 203 lb 1.9 oz (92.135 kg), SpO2 98.00%.  GEN: Well-developed,well-nourished,in no acute distress; alert,appropriate and cooperative throughout examination HEENT: Normocephalic and atraumatic without obvious abnormalities. Ears, externally  no deformities PULM: Breathing comfortably in no respiratory distress EXT: No clubbing, cyanosis, or edema PSYCH: Normally interactive. Cooperative during the interview. Pleasant. Friendly and conversant. Not anxious or depressed appearing. Normal, full affect.  Gait: Normal heel toe pattern ROM: WNL Effusion: mild Echymosis or edema: none Patellar tendon NT Painful PLICA: neg Patellar grind: negative Medial and lateral patellar facet loading: TTP medially and laterally medial and lateral joint lines: TTP medially Mcmurray's pos for pain only Flexion-pinch pos Bounce home pos Varus and valgus stress: stable Lachman: neg Ant and Post drawer: neg Hip abduction, IR, ER: WNL Hip flexion str: 5/5 Hip abd: 5/5 Quad: 5/5 VMO atrophy:No Hamstring concentric and eccentric: 5/5       Assessment & Plan:   1. Right knee pain  MR Knee Right Wo Contrast, Ambulatory referral to Orthopedic Surgery, diclofenac (VOLTAREN) 75 MG EC tablet, traMADol (ULTRAM) 50 MG tablet  2. Kiner's cyst       Clinically suspicious for meniscal pathology. Obtain an MRI of the right knee to evaluate for internal derangement, suspected meniscal pathology, rule out other occult pathology.  I'm going to go ahead and consult orthopedic surgery. In my opinion this patient has failed conservative management.

## 2011-07-21 ENCOUNTER — Ambulatory Visit: Payer: Self-pay | Admitting: Family Medicine

## 2011-07-21 ENCOUNTER — Encounter: Payer: Self-pay | Admitting: Family Medicine

## 2011-08-10 ENCOUNTER — Other Ambulatory Visit: Payer: Self-pay | Admitting: *Deleted

## 2011-08-10 MED ORDER — SERTRALINE HCL 50 MG PO TABS: 50.0000 mg | ORAL_TABLET | Freq: Every day | ORAL | Status: DC

## 2011-08-10 NOTE — Telephone Encounter (Signed)
Last refill 06/12/2011.

## 2011-08-10 NOTE — Telephone Encounter (Signed)
Rx called to [facility]. 

## 2011-08-16 ENCOUNTER — Ambulatory Visit: Payer: Self-pay | Admitting: Unknown Physician Specialty

## 2011-09-21 ENCOUNTER — Other Ambulatory Visit: Payer: Self-pay | Admitting: Internal Medicine

## 2011-09-21 ENCOUNTER — Other Ambulatory Visit: Payer: Self-pay | Admitting: *Deleted

## 2011-09-21 MED ORDER — SERTRALINE HCL 50 MG PO TABS: 50.0000 mg | ORAL_TABLET | Freq: Every day | ORAL | Status: DC

## 2011-09-21 NOTE — Telephone Encounter (Signed)
Patient requesting refill on Zoloft.

## 2011-10-05 ENCOUNTER — Ambulatory Visit (INDEPENDENT_AMBULATORY_CARE_PROVIDER_SITE_OTHER): Payer: BC Managed Care – PPO | Admitting: Family Medicine

## 2011-10-05 ENCOUNTER — Encounter: Payer: Self-pay | Admitting: Family Medicine

## 2011-10-05 VITALS — BP 102/60 | HR 89 | Temp 98.3°F | Ht 65.5 in | Wt 203.5 lb

## 2011-10-05 DIAGNOSIS — B86 Scabies: Secondary | ICD-10-CM

## 2011-10-05 MED ORDER — PERMETHRIN 5 % EX CREA: TOPICAL_CREAM | Freq: Once | CUTANEOUS | Status: AC

## 2011-10-05 NOTE — Progress Notes (Signed)
SUBJECTIVE:  Jamie Rowland is a 50 y.o. female here for rash.  Grandchild and husband have similar rash.   Very itchy.  Started on abdomen, now diffuse itchy papules, including in between fingers and toes. Benadryl does not help with itching. No new detergents or soaps.  Patient Active Problem List  Diagnoses  . OBESITY NOS  . ANXIETY DISORDER  . MIGRAINE HEADACHE  . ASTHMA  . URINARY INCONTINENCE, MIXED  . Right knee pain  . Routine general medical examination at a health care facility   No past medical history on file. No past surgical history on file. History  Substance Use Topics  . Smoking status: Never Smoker   . Smokeless tobacco: Not on file  . Alcohol Use: Not on file   Family History  Problem Relation Age of Onset  . Hypertension Mother   . Diverticulitis Mother   . Fibromyalgia Mother   . Cancer Sister     uterine  . Diabetes Maternal Uncle   . Heart disease Maternal Uncle   . Asthma Maternal Grandmother   . Heart disease Maternal Grandmother   . Emphysema Maternal Grandmother    Allergies  Allergen Reactions  . Codeine Sulfate     REACTION: Hives, can't breathe   Current Outpatient Prescriptions on File Prior to Visit  Medication Sig Dispense Refill  . albuterol (PROVENTIL HFA) 108 (90 BASE) MCG/ACT inhaler Inhale 2 puffs into the lungs every 6 (six) hours as needed for wheezing.  18 g  6  . Fluticasone Propionate, Inhal, (FLOVENT DISKUS) 100 MCG/BLIST AEPB One inhalation twice daily       . oxybutynin (DITROPAN-XL) 10 MG 24 hr tablet Take 10 mg by mouth 2 (two) times daily.        . sertraline (ZOLOFT) 50 MG tablet Take 1 tablet (50 mg total) by mouth daily.  30 tablet  0   The PMH, PSH, Social History, Family History, Medications, and allergies have been reviewed in [facility], and have been updated if relevant.  OBJECTIVE:  BP 102/60  Pulse 89  Temp(Src) 98.3 F (36.8 C) (Oral)  Ht 5' 5.5" (1.664 m)  Wt 203 lb 8 oz (92.307 kg)  BMI 33.35  kg/m2  Appears well, alert, oriented, pleasant and cooperative. Vitals are as noted.  Skin:  Diffuse papular lesions with burrowing, including in between fingers, toes and waiste line.  ROS: See HPI No fevers, chills, nausea or vomiting.  ASSESSMENT: Scabies  PLAN:   Permethrin cream x 1. See pt instructions for further details and instructions.

## 2011-10-05 NOTE — Patient Instructions (Signed)
Scabies Scabies are small bugs (mites) that burrow under the skin and cause red bumps and severe itching. These bugs can only be seen with a microscope. Scabies are highly contagious. They can spread easily from person to person by direct contact. They are also spread through sharing clothing or linens that have the scabies mites living in them. It is not unusual for an entire family to become infected through shared towels, clothing, or bedding.   HOME CARE INSTRUCTIONS    Your caregiver may prescribe a cream or lotion to kill the mites. If this cream is prescribed; massage the cream into the entire area of the body from the neck to the bottom of both feet. Also massage the cream into the scalp and face if your child is less than 1 year old. Avoid the eyes and mouth.     Leave the cream on for 8 to12 hours. Do not wash your hands after application. Your child should bathe or shower after the 8 to 12 hour application period. Sometimes it is helpful to apply the cream to your child at right before bedtime.     One treatment is usually effective and will eliminate approximately 95% of infestations. For severe cases, your caregiver may decide to repeat the treatment in 1 week. Everyone in your household should be treated with one application of the cream.     New rashes or burrows should not appear after successful treatment within 24 to 48 hours; however the itching and rash may last for 2 to 4 weeks after successful treatment. If your symptoms persist longer than this, see your caregiver.     Your caregiver also may prescribe a medication to help with the itching or to help the rash go away more quickly.     Scabies can live on clothing or linens for up to 3 days. Your entire child's recently used clothing, towels, stuffed toys, and bed linens should be washed in hot water and then dried in a dryer for at least 20 minutes on high heat. Items that cannot be washed should be enclosed in a plastic bag for  at least 3 days.     To help relieve itching, bathe your child in a cool bath or apply cool washcloths to the affected areas.     Your child may return to school after treatment with the prescribed cream.  SEEK MEDICAL CARE IF:    The itching persists longer than 4 weeks after treatment.     The rash spreads or becomes infected (the area has red blisters or yellow-tan crust).  Document Released: 10/02/2005 Document Revised: 06/14/2011 Document Reviewed: 02/10/2009 [employer] Patient Information 2012 [employer]. 

## 2011-10-17 HISTORY — PX: KNEE ARTHROSCOPY: SUR90

## 2011-11-15 ENCOUNTER — Other Ambulatory Visit: Payer: Self-pay | Admitting: *Deleted

## 2011-11-15 MED ORDER — OXYBUTYNIN CHLORIDE ER 10 MG PO TB24: 10.0000 mg | ORAL_TABLET | Freq: Two times a day (BID) | ORAL | Status: DC

## 2011-11-21 ENCOUNTER — Other Ambulatory Visit: Payer: Self-pay | Admitting: *Deleted

## 2011-11-21 MED ORDER — SERTRALINE HCL 50 MG PO TABS: 50.0000 mg | ORAL_TABLET | Freq: Every day | ORAL | Status: DC

## 2011-12-18 ENCOUNTER — Telehealth: Payer: Self-pay | Admitting: Family Medicine

## 2011-12-18 ENCOUNTER — Ambulatory Visit (INDEPENDENT_AMBULATORY_CARE_PROVIDER_SITE_OTHER): Payer: BC Managed Care – PPO | Admitting: Family Medicine

## 2011-12-18 ENCOUNTER — Encounter: Payer: Self-pay | Admitting: Family Medicine

## 2011-12-18 VITALS — BP 104/80 | HR 100 | Temp 98.2°F | Wt 205.0 lb

## 2011-12-18 DIAGNOSIS — J45909 Unspecified asthma, uncomplicated: Secondary | ICD-10-CM

## 2011-12-18 MED ORDER — IPRATROPIUM BROMIDE 0.02 % IN SOLN
0.5000 mg | Freq: Once | RESPIRATORY_TRACT | Status: AC
  Administered 2011-12-18: 0.5 mg via RESPIRATORY_TRACT

## 2011-12-18 MED ORDER — IPRATROPIUM BROMIDE 0.02 % IN SOLN: 0.5000 mg | Freq: Once | RESPIRATORY_TRACT | Status: DC

## 2011-12-18 MED ORDER — AZITHROMYCIN 250 MG PO TABS: ORAL_TABLET | ORAL | Status: AC

## 2011-12-18 MED ORDER — ALBUTEROL SULFATE (2.5 MG/3ML) 0.083% IN NEBU: 2.5000 mg | INHALATION_SOLUTION | Freq: Once | RESPIRATORY_TRACT | Status: DC

## 2011-12-18 MED ORDER — MONTELUKAST SODIUM 10 MG PO TABS: 10.0000 mg | ORAL_TABLET | Freq: Every day | ORAL | Status: DC

## 2011-12-18 MED ORDER — ALBUTEROL SULFATE (2.5 MG/3ML) 0.083% IN NEBU
2.5000 mg | INHALATION_SOLUTION | Freq: Once | RESPIRATORY_TRACT | Status: AC
  Administered 2011-12-18: 2.5 mg via RESPIRATORY_TRACT

## 2011-12-18 MED ORDER — FLUTICASONE-SALMETEROL 100-50 MCG/DOSE IN AEPB: 1.0000 | INHALATION_SPRAY | Freq: Two times a day (BID) | RESPIRATORY_TRACT | Status: DC

## 2011-12-18 MED ORDER — ALBUTEROL SULFATE HFA 108 (90 BASE) MCG/ACT IN AERS: 2.0000 | INHALATION_SPRAY | Freq: Four times a day (QID) | RESPIRATORY_TRACT | Status: DC | PRN

## 2011-12-18 NOTE — Telephone Encounter (Signed)
Seeing pt today

## 2011-12-18 NOTE — Progress Notes (Signed)
SUBJECTIVE:  Jamie Rowland is a 51 y.o. female who complains of dry cough and wheezing for 30 days. She denies a history of anorexia and chest pain and has a history of asthma. Patient denies smoke cigarettes.   She has been moving and highly allergic to dust which she thinks is making this acutely worse although she has been wheezing for a month.  Went to UC one month ago, per pt, diagnosed with bronchitis and given an inhaler and abx (awaiting notes).  No CP, no dizziness.  Patient Active Problem List  Diagnoses  . OBESITY NOS  . ANXIETY DISORDER  . MIGRAINE HEADACHE  . ASTHMA  . URINARY INCONTINENCE, MIXED  . Right knee pain  . Routine general medical examination at a health care facility   No past medical history on file. No past surgical history on file. History  Substance Use Topics  . Smoking status: Never Smoker   . Smokeless tobacco: Not on file  . Alcohol Use: Not on file   Family History  Problem Relation Age of Onset  . Hypertension Mother   . Diverticulitis Mother   . Fibromyalgia Mother   . Cancer Sister     uterine  . Diabetes Maternal Uncle   . Heart disease Maternal Uncle   . Asthma Maternal Grandmother   . Heart disease Maternal Grandmother   . Emphysema Maternal Grandmother    Allergies  Allergen Reactions  . Codeine Sulfate     REACTION: Hives, can't breathe   Current Outpatient Prescriptions on File Prior to Visit  Medication Sig Dispense Refill  . albuterol (PROVENTIL HFA) 108 (90 BASE) MCG/ACT inhaler Inhale 2 puffs into the lungs every 6 (six) hours as needed for wheezing.  18 g  6  . Fluticasone Propionate, Inhal, (FLOVENT DISKUS) 100 MCG/BLIST AEPB One inhalation twice daily       . oxybutynin (DITROPAN-XL) 10 MG 24 hr tablet Take 1 tablet (10 mg total) by mouth 2 (two) times daily.  60 tablet  6  . sertraline (ZOLOFT) 50 MG tablet Take 1 tablet (50 mg total) by mouth daily.  30 tablet  6   The PMH, PSH, Social History, Family History,  Medications, and allergies have been reviewed in [facility], and have been updated if relevant.  OBJECTIVE: BP 104/80  Pulse 100  Temp(Src) 98.2 F (36.8 C) (Oral)  Wt 205 lb (92.987 kg)  SpO2 92%  She appears well, vital signs are as noted. Ears normal.  Throat and pharynx normal.  Neck supple. No adenopathy in the neck. Nose is congested. Sinuses non tender. Diffuse exp wheezes  ASSESSMENT:  bronchitis and asthma  PLAN: Improved s/p atrovent/albuterol nebs in office. Start Advair, Singulair, proair as needed. Symptomatic therapy suggested: push fluids, rest and return office visit prn if symptoms persist or worsen.  Call or return to clinic prn if these symptoms worsen or fail to improve as anticipated. See pt instructions for details.

## 2011-12-18 NOTE — Telephone Encounter (Signed)
Triage Record Num: [id number] Operator: Lesli Albee Patient Name: Jamie Rowland Call Date & Time: 12/18/2011 1:34:26PM Patient Phone: [phone] PCP: Patient Gender: Female PCP Fax : Patient DOB: 1961/02/26 Practice Name: [facility] Reason for Call: Caller: Thomas Hoff; PCP: Ruthe Mannan [facility]); CB#: [phone]; ; ; Call regarding Asthma Flare Up; pt is calling with an audible wheeze. pt states she has asthma/she has been wheezing since t his am and has run out of her inhaler. Pt tells RN that she was treated at UC 1 month ago with abx and inhaler and is not any better. pt is afeb. there are no appts left for today. Rn called the back line/Cynthia will work the pt in at 2:15 today. Protocol(s) Used: Asthma - Adult Recommended Outcome per Protocol: See Provider within 4 hours Reason for Outcome: Having asthma symptoms that have not improved or are worsening after following provider's instructions (quick relief medicine does not relieve symptoms). Care Advice: ~ Avoid any activity that produces symptoms until evaluated by provider. ~ Rescue medication at recommended dose can be used every 20 minutes up to 3 doses. ~ SYMPTOM / CONDITION MANAGEMENT ~ CAUTIONS Tell your provider if you: - Have been hospitalized or had ED care for your asthma in past month; - Have had 2 or more hospitalizations OR 3 or more ED visits for your asthma in past year; - Have used more than 2 canisters of rescue medication in past month; - Are currently taking or have had recent withdrawal of systemic corticosteroids; - Have another chronic illness in addition to asthma; - Are having serious problems coping with psychosocial issues. ~ 12/18/2011 1:51:58PM Page 1 of 1 [email or web address]

## 2011-12-18 NOTE — Patient Instructions (Signed)
Take antibiotic as directed.  Drink lots of fluids.  Treat sympotmatically with Mucinex, nasal saline irrigation, and Tylenol/Ibuprofen.  Please use your Advair and Singulair as directed. Use proair (albuterol) as needed.

## 2011-12-18 NOTE — Progress Notes (Signed)
Addended by: Eliezer Bottom on: 12/18/2011 03:56 PM   Modules accepted: Orders

## 2012-02-23 ENCOUNTER — Telehealth: Payer: Self-pay | Admitting: Family Medicine

## 2012-02-23 ENCOUNTER — Ambulatory Visit: Payer: BC Managed Care – PPO | Admitting: Internal Medicine

## 2012-02-23 DIAGNOSIS — Z0289 Encounter for other administrative examinations: Secondary | ICD-10-CM

## 2012-02-23 NOTE — Telephone Encounter (Signed)
Caller: Jamie Rowland/Patient; PCP: Ruthe Mannan ([facility]amp); CB#: [phone];  Call regarding bending down to pick up granddaughter and injured back on 02/22/12.  Aching on R side and she has tried taking Ibuprofen and not helping with pain.  Similar injury about 6 months ago when picking up boxes. She is wanting to know if same Pain Medication could be called in. Advised that she needs to be checked in office. Triage and care advice per Back Sx Protocol and appnt scheduled for 1215 02/23/12.

## 2012-03-04 ENCOUNTER — Telehealth: Payer: Self-pay | Admitting: *Deleted

## 2012-03-04 ENCOUNTER — Other Ambulatory Visit: Payer: Self-pay | Admitting: Family Medicine

## 2012-03-04 DIAGNOSIS — L989 Disorder of the skin and subcutaneous tissue, unspecified: Secondary | ICD-10-CM

## 2012-03-04 NOTE — Telephone Encounter (Signed)
Advised pt.  She is agreeable to referral, has no preference where.

## 2012-03-04 NOTE — Telephone Encounter (Signed)
Message copied by Eliezer Bottom on Mon Mar 04, 2012  2:23 PM ------      Message from: Dianne Dun      Created: Mon Mar 04, 2012 12:57 PM       Please call pt to let her know that we can refer her to dermatology for mole removal.  She does not need to be seen here first.  i will place referral.

## 2012-03-07 ENCOUNTER — Ambulatory Visit: Payer: BC Managed Care – PPO | Admitting: Family Medicine

## 2012-03-21 ENCOUNTER — Telehealth: Payer: Self-pay | Admitting: *Deleted

## 2012-03-21 NOTE — Telephone Encounter (Signed)
Form signed by Dr. Dayton Martes, form faxed back to company.

## 2012-03-21 NOTE — Telephone Encounter (Signed)
Form for nebulizer supplies is on your desk.  I checked with the patient and she does want these supplies.

## 2012-06-12 ENCOUNTER — Other Ambulatory Visit: Payer: Self-pay | Admitting: Family Medicine

## 2012-06-12 DIAGNOSIS — Z0000 Encounter for general adult medical examination without abnormal findings: Secondary | ICD-10-CM

## 2012-06-12 DIAGNOSIS — Z136 Encounter for screening for cardiovascular disorders: Secondary | ICD-10-CM

## 2012-06-21 ENCOUNTER — Other Ambulatory Visit: Payer: Self-pay | Admitting: *Deleted

## 2012-06-21 MED ORDER — MONTELUKAST SODIUM 10 MG PO TABS: 10.0000 mg | ORAL_TABLET | Freq: Every day | ORAL | Status: DC

## 2012-06-25 ENCOUNTER — Other Ambulatory Visit: Payer: BC Managed Care – PPO

## 2012-06-27 ENCOUNTER — Other Ambulatory Visit (INDEPENDENT_AMBULATORY_CARE_PROVIDER_SITE_OTHER): Payer: BC Managed Care – PPO

## 2012-06-27 DIAGNOSIS — Z136 Encounter for screening for cardiovascular disorders: Secondary | ICD-10-CM

## 2012-06-27 DIAGNOSIS — Z0000 Encounter for general adult medical examination without abnormal findings: Secondary | ICD-10-CM

## 2012-06-27 LAB — COMPREHENSIVE METABOLIC PANEL
Albumin: 3.8 g/dL (ref 3.5–5.2)
BUN: 17 mg/dL (ref 6–23)
CO2: 25 mEq/L (ref 19–32)
Calcium: 8.9 mg/dL (ref 8.4–10.5)
Chloride: 105 mEq/L (ref 96–112)
GFR: 73.29 mL/min (ref >60.00)
Glucose, Bld: 88 mg/dL (ref 70–99)
Potassium: 3.7 mEq/L (ref 3.5–5.1)
Sodium: 138 mEq/L (ref 135–145)
Total Protein: 7.2 g/dL (ref 6.0–8.3)

## 2012-06-27 LAB — LIPID PANEL
Cholesterol: 182 mg/dL (ref 0–200)
LDL Cholesterol: 118 mg/dL — ABNORMAL HIGH (ref 0–99)

## 2012-07-02 ENCOUNTER — Ambulatory Visit (INDEPENDENT_AMBULATORY_CARE_PROVIDER_SITE_OTHER): Payer: BC Managed Care – PPO | Admitting: Family Medicine

## 2012-07-02 ENCOUNTER — Encounter: Payer: Self-pay | Admitting: Family Medicine

## 2012-07-02 VITALS — BP 104/70 | HR 80 | Temp 98.1°F | Ht 65.75 in | Wt 212.0 lb

## 2012-07-02 DIAGNOSIS — Z0000 Encounter for general adult medical examination without abnormal findings: Secondary | ICD-10-CM

## 2012-07-02 DIAGNOSIS — F411 Generalized anxiety disorder: Secondary | ICD-10-CM

## 2012-07-02 DIAGNOSIS — J45909 Unspecified asthma, uncomplicated: Secondary | ICD-10-CM

## 2012-07-02 DIAGNOSIS — Z23 Encounter for immunization: Secondary | ICD-10-CM

## 2012-07-02 DIAGNOSIS — Z1231 Encounter for screening mammogram for malignant neoplasm of breast: Secondary | ICD-10-CM

## 2012-07-02 DIAGNOSIS — E669 Obesity, unspecified: Secondary | ICD-10-CM

## 2012-07-02 MED ORDER — FLUTICASONE-SALMETEROL 100-50 MCG/DOSE IN AEPB: 1.0000 | INHALATION_SPRAY | Freq: Two times a day (BID) | RESPIRATORY_TRACT | Status: DC

## 2012-07-02 MED ORDER — MONTELUKAST SODIUM 10 MG PO TABS: 10.0000 mg | ORAL_TABLET | Freq: Every day | ORAL | Status: DC

## 2012-07-02 MED ORDER — PHENTERMINE HCL 15 MG PO CAPS: 15.0000 mg | ORAL_CAPSULE | ORAL | Status: DC

## 2012-07-02 NOTE — Progress Notes (Signed)
51 yo here for CPX.  Yearly Pap and mammogram done with Dr. Logan Bores, [facility].  Sees him next month.  Obesity- can't seem to lose weight.  She has cut back fats and sugars in her diet (evident in her labs). She is trying to walk at least once a day.    Wt Readings from Last 3 Encounters:  07/02/12 212 lb (96.163 kg)  12/18/11 205 lb (92.987 kg)  10/05/11 203 lb 8 oz (92.307 kg)    Lab Results  Component Value Date   CHOL 182 06/27/2012   HDL 37.90* 06/27/2012   LDLCALC 118* 06/27/2012   TRIG 130.0 06/27/2012   CHOLHDL 5 06/27/2012    Asthma- recently got a cat and symptoms deteriorated. Prior to that, was doing well with Singular, Advair and flonase.  mixed urinary incontinence- much improved with oxybutynin 5 mg two times a day.   Wt Readings from Last 3 Encounters:  07/02/12 212 lb (96.163 kg)  12/18/11 205 lb (92.987 kg)  10/05/11 203 lb 8 oz (92.307 kg)      Patient Active Problem List  Diagnosis  . OBESITY NOS  . ANXIETY DISORDER  . MIGRAINE HEADACHE  . ASTHMA  . URINARY INCONTINENCE, MIXED  . Right knee pain  . Routine general medical examination at a health care facility   No past medical history on file. No past surgical history on file. History  Substance Use Topics  . Smoking status: Never Smoker   . Smokeless tobacco: Not on file  . Alcohol Use: Not on file   Family History  Problem Relation Age of Onset  . Hypertension Mother   . Diverticulitis Mother   . Fibromyalgia Mother   . Cancer Sister     uterine  . Diabetes Maternal Uncle   . Heart disease Maternal Uncle   . Asthma Maternal Grandmother   . Heart disease Maternal Grandmother   . Emphysema Maternal Grandmother    Allergies  Allergen Reactions  . Codeine Sulfate     REACTION: Hives, can't breathe   Current Outpatient Prescriptions on File Prior to Visit  Medication Sig Dispense Refill  . albuterol (PROVENTIL) (2.5 MG/3ML) 0.083% nebulizer solution Take 3 mLs (2.5 mg total) by  nebulization once.  150 mL  1  . Fluticasone Propionate, Inhal, (FLOVENT DISKUS) 100 MCG/BLIST AEPB One inhalation twice daily       . Fluticasone-Salmeterol (ADVAIR) 100-50 MCG/DOSE AEPB Inhale 1 puff into the lungs 2 (two) times daily.  1 each  3  . montelukast (SINGULAIR) 10 MG tablet Take 1 tablet (10 mg total) by mouth at bedtime.  30 tablet  0  . oxybutynin (DITROPAN-XL) 10 MG 24 hr tablet Take 1 tablet (10 mg total) by mouth 2 (two) times daily.  60 tablet  6  . sertraline (ZOLOFT) 50 MG tablet Take 1 tablet (50 mg total) by mouth daily.  30 tablet  6  . SUMAtriptan (IMITREX) 100 MG tablet Take one at onset of headache       Current Facility-Administered Medications on File Prior to Visit  Medication Dose Route Frequency Provider Last Rate Last Dose  . ipratropium (ATROVENT) nebulizer solution 0.5 mg  0.5 mg Nebulization Once Dianne Dun, MD       The PMH, PSH, Social History, Family History, Medications, and allergies have been reviewed in [facility], and have been updated if relevant.  Review of Systems  See HPI  Patient reports no  vision/ hearing changes,anorexia, weight change, fever ,adenopathy,  persistant / recurrent hoarseness, swallowing issues, chest pain, edema,persistant / recurrent cough, hemoptysis, dyspnea(rest, exertional, paroxysmal nocturnal), gastrointestinal  bleeding (melena, rectal bleeding), abdominal pain, excessive heart burn, GU symptoms(dysuria, hematuria, pyuria, voiding/incontinence  Issues) syncope, focal weakness, severe memory loss, concerning skin lesions, depression, anxiety, abnormal bruising/bleeding, major joint swelling, breast masses or abnormal vaginal bleeding.     Physical Exam  BP 104/70  Pulse 80  Temp 98.1 F (36.7 C)  Ht 5' 5.75" (1.67 m)  Wt 212 lb (96.163 kg)  BMI 34.48 kg/m2  General:  Well-developed,well-nourished,in no acute distress; alert,appropriate and cooperative throughout examination Head:  normocephalic and atraumatic.   Eyes:   vision grossly intact, pupils equal, pupils round, and pupils reactive to light.   Ears:  R ear normal and L ear normal.   Nose:  no external deformity.   Mouth:  good dentition.   Neck:  No deformities, masses, or tenderness noted. Lungs:  Normal respiratory effort, chest expands symmetrically. Lungs are clear to auscultation, no crackles or wheezes. Heart:  Normal rate and regular rhythm. S1 and S2 normal without gallop, murmur, click, rub or other extra sounds. Abdomen:  Bowel sounds positive,abdomen soft and non-tender without masses, organomegaly or hernias noted. Msk:  No deformity or scoliosis noted of thoracic or lumbar spine.   Extremities:  No clubbing, cyanosis, edema, or deformity noted with normal full range of motion of all joints.   Neurologic:  alert & oriented X3 and gait normal.   Skin:  Intact without suspicious lesions or rashes Axillary Nodes:  No palpable lymphadenopathy Psych:  Cognition and judgment appear intact. Alert and cooperative with normal attention span and concentration. No apparent delusions, illusions, hallucinations   Assessment and Plan: 1. Routine general medical examination at a health care facility  Reviewed preventive care protocols, scheduled due services, and updated immunizations Discussed nutrition, exercise, diet, and healthy lifestyle.    2. OBESITY NOS  Deteriorated. Discussed risks of phentermine, she does want to try it. Rx given.  Follow up in one month.   3. ANXIETY DISORDER  Stable on Zoloft.   4. ASTHMA  Deteriorated due to cat exposure. Continue current meds.   5. Other screening mammogram  MM Digital Screening

## 2012-07-02 NOTE — Addendum Note (Signed)
Addended by: Eliezer Bottom on: 07/02/2012 09:20 AM   Modules accepted: Orders

## 2012-07-02 NOTE — Patient Instructions (Addendum)
Great to see you.  We are starting phentermine. Please come see me in one month.  Please stop by to see Shirlee Limerick to set up your mammogram.

## 2012-07-11 ENCOUNTER — Telehealth: Payer: Self-pay

## 2012-07-11 NOTE — Telephone Encounter (Signed)
Advised patient ok to take together, call if any problems before her follow up visit in 4 weeks.  Pt agreed.

## 2012-07-11 NOTE — Telephone Encounter (Signed)
Noted! Thank you

## 2012-07-11 NOTE — Telephone Encounter (Signed)
There is an interaction with other antidepressants, like MAOI inhibitors but I am unaware of any interaction between zoloft (an SSRI) and phentermine. They should be safe to take together.

## 2012-07-11 NOTE — Telephone Encounter (Signed)
Spoke with April at [facility].  She says there could be an interaction between the two meds but she says she discussed this with the patient and told her what symptoms to look out for, that not everyone has a problem with this.  Pt told April that she had already discussed this with you and was aware and understood.  April didn't know that the pt was going to call.

## 2012-07-11 NOTE — Telephone Encounter (Signed)
pts pharmacist told pt could be interaction between phentermine and zoloft (could cause too much seratonin) pt wants to know if can take phenermine and zoloft together? Please advise.

## 2012-08-01 ENCOUNTER — Ambulatory Visit: Payer: BC Managed Care – PPO | Admitting: Family Medicine

## 2012-08-07 ENCOUNTER — Ambulatory Visit: Payer: BC Managed Care – PPO | Admitting: Family Medicine

## 2012-08-12 ENCOUNTER — Ambulatory Visit: Payer: BC Managed Care – PPO | Admitting: Family Medicine

## 2012-08-12 DIAGNOSIS — Z0289 Encounter for other administrative examinations: Secondary | ICD-10-CM

## 2012-10-29 ENCOUNTER — Other Ambulatory Visit: Payer: Self-pay | Admitting: *Deleted

## 2012-10-29 MED ORDER — OXYBUTYNIN CHLORIDE ER 10 MG PO TB24: 10.0000 mg | ORAL_TABLET | Freq: Two times a day (BID) | ORAL | Status: DC

## 2013-01-24 ENCOUNTER — Encounter: Payer: Self-pay | Admitting: Family Medicine

## 2013-01-24 ENCOUNTER — Ambulatory Visit (INDEPENDENT_AMBULATORY_CARE_PROVIDER_SITE_OTHER): Payer: BC Managed Care – PPO | Admitting: Family Medicine

## 2013-01-24 VITALS — BP 120/72 | HR 93 | Temp 98.3°F | Wt 214.0 lb

## 2013-01-24 DIAGNOSIS — J302 Other seasonal allergic rhinitis: Secondary | ICD-10-CM | POA: Insufficient documentation

## 2013-01-24 DIAGNOSIS — J45909 Unspecified asthma, uncomplicated: Secondary | ICD-10-CM

## 2013-01-24 DIAGNOSIS — J309 Allergic rhinitis, unspecified: Secondary | ICD-10-CM

## 2013-01-24 MED ORDER — PREDNISONE 20 MG PO TABS: ORAL_TABLET | ORAL | Status: DC

## 2013-01-24 NOTE — Progress Notes (Signed)
  Subjective:    Patient ID: Jamie Rowland, female    DOB: [birth year], 52 y.o.   MRN: [id number]  HPI  52 year old female with history of  Moderate persistant asthma on singulair, flovent and advair presents with   1 week of congestion, cough. Dry cough. Wheeze and SOB. 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 No fever. No ear pain and face pain.  She has a cat that  may be triggering allergies. Seasonal allergies typically are a trigger for her.  HAs been taking claritin but it does not help.  Albuterol nebs needed 2-3 times a day...temporarily helps with wheeze and SOB.   Only uses albuterol for rescue few times a year for asthma flares.   Review of Systems  Constitutional: Negative for fever and fatigue.  HENT: Negative for ear pain.   Eyes: Negative for pain.  Respiratory: Positive for shortness of breath and wheezing. Negative for chest tightness.   Cardiovascular: Negative for chest pain, palpitations and leg swelling.  Gastrointestinal: Negative for abdominal pain.  Genitourinary: Negative for dysuria.       Objective:   Physical Exam  Constitutional: Vital signs are normal. She appears well-developed and well-nourished. She is cooperative.  Non-toxic appearance. She does not appear ill. No distress.  HENT:  Head: Normocephalic.  Right Ear: Hearing, tympanic membrane, external ear and ear canal normal. Tympanic membrane is not erythematous, not retracted and not bulging.  Left Ear: Hearing, tympanic membrane, external ear and ear canal normal. Tympanic membrane is not erythematous, not retracted and not bulging.  Nose: Mucosal edema and rhinorrhea present. Right sinus exhibits no maxillary sinus tenderness and no frontal sinus tenderness. Left sinus exhibits no maxillary sinus tenderness and no frontal sinus tenderness.  Mouth/Throat: Uvula is midline, oropharynx is clear and moist and mucous membranes are  normal.  Eyes: Conjunctivae, EOM and lids are normal. Pupils are equal, round, and reactive to light. No foreign bodies found.  Neck: Trachea normal and normal range of motion. Neck supple. Carotid bruit is not present. No mass and no thyromegaly present.  Cardiovascular: Normal rate, regular rhythm, S1 normal, S2 normal, normal heart sounds, intact distal pulses and normal pulses.  Exam reveals no gallop and no friction rub.   No murmur heard. Pulmonary/Chest: Effort normal. No accessory muscle usage. Not tachypneic. No respiratory distress. She has no decreased breath sounds. She has wheezes in the right upper field, the right middle field, the right lower field, the left upper field, the left middle field and the left lower field. She has no rhonchi. She has no rales.  Abdominal: Soft. Normal appearance and bowel sounds are normal. There is no tenderness.  Neurological: She is alert.  Skin: Skin is warm, dry and intact. No rash noted.  Psychiatric: Her speech is normal and behavior is normal. Judgment and thought content normal. Her mood appears not anxious. Cognition and memory are normal. She does not exhibit a depressed mood.          Assessment & Plan:

## 2013-01-24 NOTE — Assessment & Plan Note (Signed)
Treat with steroids, rescue albuterol prn. Trigger is allergies.  Continue controller meds.

## 2013-01-24 NOTE — Patient Instructions (Addendum)
Start zyrtec at bedtime.  Start a prednisone taper over 6 days.  Continue the albuterol rescue as needed. Call if new fever, change in sputum color or shortness of breath increasing or not improving after 48-72 hours.

## 2013-01-24 NOTE — Assessment & Plan Note (Signed)
This is likely trigger.. Add zyrtec to singulair.

## 2013-02-21 ENCOUNTER — Encounter: Payer: Self-pay | Admitting: Family Medicine

## 2013-02-21 ENCOUNTER — Ambulatory Visit (INDEPENDENT_AMBULATORY_CARE_PROVIDER_SITE_OTHER): Payer: BC Managed Care – PPO | Admitting: Family Medicine

## 2013-02-21 VITALS — BP 120/60 | HR 101 | Temp 98.8°F | Ht 65.75 in | Wt 223.5 lb

## 2013-02-21 DIAGNOSIS — M25569 Pain in unspecified knee: Secondary | ICD-10-CM

## 2013-02-21 DIAGNOSIS — J4541 Moderate persistent asthma with (acute) exacerbation: Secondary | ICD-10-CM

## 2013-02-21 DIAGNOSIS — J45901 Unspecified asthma with (acute) exacerbation: Secondary | ICD-10-CM

## 2013-02-21 DIAGNOSIS — M25562 Pain in left knee: Secondary | ICD-10-CM

## 2013-02-21 MED ORDER — FLUTICASONE-SALMETEROL 250-50 MCG/DOSE IN AEPB: 1.0000 | INHALATION_SPRAY | Freq: Two times a day (BID) | RESPIRATORY_TRACT | Status: DC

## 2013-02-21 MED ORDER — DICLOFENAC SODIUM 75 MG PO TBEC: 75.0000 mg | DELAYED_RELEASE_TABLET | Freq: Two times a day (BID) | ORAL | Status: DC

## 2013-02-21 NOTE — Assessment & Plan Note (Signed)
Pain over medial joint line... Likely arthritis, but concerning for possible meniscal injury given history of locking, poping.  Start with conservative treatment.. Ice, ROM exercsie, NSAIDs, elevation... If not improving follow up in 2 weeks for likely X-ray and further eval.

## 2013-02-21 NOTE — Progress Notes (Signed)
Subjective:    Patient ID: Jamie Rowland, female    DOB: [birth year], 52 y.o.   MRN: [id number]  HPI  52 year old female pt of Dr. Elmer Sow with history of moderate persistant asthma, seen for exacerbation in early April presents for follow up. At that time she was started on started zyrtec at bedtime in addition to the singulair she was on. She was given a prednisone taper over 6 days.   She continues on her Advair controled at 100/50  BID.  She reports that her symptoms improved some but then cough returned in last week. [place] the [place] but since [place] in this area ( house has mold, she is trying to move)... Symptoms worse. Using albuterol daily. Wheeze and SOB intermittantly. Symptoms are worse outside with pollen.   No fever.  Left knee pain, ongoing 3 weeks. Progressively worsening. No known recent injury. Step in hole 6 months ago Pain in left medial and posterior knee. No swelling., no redness. Pain worse after sitting a while then standing and walk. Pain with both ending and straightening. No  Clicking but occ popping... occ feels like it locks in place.   Using aleve without relief for 3 weeks.  S/P meniscal injury in left knee.. S/p repair.   200 peak flows... But minimal effort     Review of Systems  Constitutional: Negative for fever and fatigue.  HENT: Negative for ear pain.   Eyes: Negative for pain.  Respiratory: Positive for cough, shortness of breath and wheezing. Negative for chest tightness.   Cardiovascular: Negative for chest pain, palpitations and leg swelling.  Gastrointestinal: Negative for abdominal pain.  Genitourinary: Negative for dysuria.       Objective:   Physical Exam  Constitutional: Vital signs are normal. She appears well-developed and well-nourished. She is cooperative.  Non-toxic appearance. She does not appear ill. No distress.  HENT:  Head: Normocephalic.  Right Ear: Hearing, tympanic membrane, external ear and ear canal  normal. Tympanic membrane is not erythematous, not retracted and not bulging.  Left Ear: Hearing, tympanic membrane, external ear and ear canal normal. Tympanic membrane is not erythematous, not retracted and not bulging.  Nose: No mucosal edema or rhinorrhea. Right sinus exhibits no maxillary sinus tenderness and no frontal sinus tenderness. Left sinus exhibits no maxillary sinus tenderness and no frontal sinus tenderness.  Mouth/Throat: Uvula is midline, oropharynx is clear and moist and mucous membranes are normal.  Eyes: Conjunctivae, EOM and lids are normal. Pupils are equal, round, and reactive to light. No foreign bodies found.  Neck: Trachea normal and normal range of motion. Neck supple. Carotid bruit is not present. No mass and no thyromegaly present.  Cardiovascular: Normal rate, regular rhythm, S1 normal, S2 normal, normal heart sounds, intact distal pulses and normal pulses.  Exam reveals no gallop and no friction rub.   No murmur heard. Pulmonary/Chest: Effort normal and breath sounds normal. Not tachypneic. No respiratory distress. She has no decreased breath sounds. She has no wheezes. She has no rhonchi. She has no rales.  Abdominal: Soft. Normal appearance and bowel sounds are normal. There is no tenderness.  Neurological: She is alert.  Skin: Skin is warm, dry and intact. No rash noted.  Psychiatric: Her speech is normal and behavior is normal. Judgment and thought content normal. Her mood appears not anxious. Cognition and memory are normal. She does not exhibit a depressed mood.          Assessment & Plan:

## 2013-02-21 NOTE — Patient Instructions (Addendum)
Increase advair to 250/50 twice daily. No oral steroids for now, but let me know if not improving.  Stop ibuprofen.  Start diclofenac twice daily for inflammation and pain.  Limit beding , squating, twisting knee.  Ice knee several times a day. Range of motion exercises. Work on weight loss to decrease impact on knee.  Follow up with PCP in 2 weeks if knee pain knot improving.

## 2013-02-21 NOTE — Assessment & Plan Note (Addendum)
No clear wheeze on exam today, peak flows inadequate effort... Will hold on prednsione taper repeat... Increase advair to 250/50 BID. No clear infectious trigger... Likely allergy trigger.

## 2013-03-20 ENCOUNTER — Other Ambulatory Visit: Payer: Self-pay | Admitting: Family Medicine

## 2013-06-03 ENCOUNTER — Telehealth: Payer: Self-pay | Admitting: Family Medicine

## 2013-06-03 NOTE — Telephone Encounter (Signed)
I notified patient.  Patient scheduled appointment for lab work on 07/04/13 and cpx on 07/08/13.

## 2013-06-03 NOTE — Telephone Encounter (Signed)
Patient needs a physical scheduled before the end of September.  Patient needs it done for insurance or her rates will double.  Can patient be seen sooner for her physical?

## 2013-06-03 NOTE — Telephone Encounter (Signed)
Yes ok to use two 15 minute slots. 

## 2013-06-26 ENCOUNTER — Other Ambulatory Visit: Payer: Self-pay | Admitting: Family Medicine

## 2013-06-26 DIAGNOSIS — Z0000 Encounter for general adult medical examination without abnormal findings: Secondary | ICD-10-CM

## 2013-06-26 DIAGNOSIS — Z136 Encounter for screening for cardiovascular disorders: Secondary | ICD-10-CM

## 2013-07-04 ENCOUNTER — Other Ambulatory Visit (INDEPENDENT_AMBULATORY_CARE_PROVIDER_SITE_OTHER): Payer: BC Managed Care – PPO

## 2013-07-04 DIAGNOSIS — Z0000 Encounter for general adult medical examination without abnormal findings: Secondary | ICD-10-CM

## 2013-07-04 DIAGNOSIS — Z136 Encounter for screening for cardiovascular disorders: Secondary | ICD-10-CM

## 2013-07-04 DIAGNOSIS — E669 Obesity, unspecified: Secondary | ICD-10-CM

## 2013-07-04 DIAGNOSIS — J309 Allergic rhinitis, unspecified: Secondary | ICD-10-CM

## 2013-07-04 LAB — COMPREHENSIVE METABOLIC PANEL
Albumin: 3.8 g/dL (ref 3.5–5.2)
BUN: 20 mg/dL (ref 6–23)
Calcium: 9 mg/dL (ref 8.4–10.5)
Chloride: 107 mEq/L (ref 96–112)
Glucose, Bld: 86 mg/dL (ref 70–99)
Potassium: 4.3 mEq/L (ref 3.5–5.1)

## 2013-07-04 LAB — CBC WITH DIFFERENTIAL/PLATELET
Basophils Absolute: 0.1 10*3/uL (ref 0.0–0.1)
Eosinophils Absolute: 0.5 10*3/uL (ref 0.0–0.7)
HCT: 42.2 % (ref 36.0–46.0)
Hemoglobin: 14.5 g/dL (ref 12.0–15.0)
Lymphs Abs: 3.3 10*3/uL (ref 0.7–4.0)
MCHC: 34.3 g/dL (ref 30.0–36.0)
MCV: 91.5 fl (ref 78.0–100.0)
Neutro Abs: 4.8 10*3/uL (ref 1.4–7.7)
RDW: 12.8 % (ref 11.5–14.6)

## 2013-07-04 LAB — LIPID PANEL
Cholesterol: 163 mg/dL (ref 0–200)
Triglycerides: 203 mg/dL — ABNORMAL HIGH (ref 0.0–149.0)

## 2013-07-08 ENCOUNTER — Encounter: Payer: BC Managed Care – PPO | Admitting: Family Medicine

## 2013-07-08 ENCOUNTER — Encounter: Payer: BC Managed Care – PPO | Admitting: Internal Medicine

## 2013-07-08 ENCOUNTER — Telehealth: Payer: Self-pay | Admitting: Family Medicine

## 2013-07-08 NOTE — Telephone Encounter (Signed)
Encounter opened in error

## 2013-07-16 ENCOUNTER — Ambulatory Visit (INDEPENDENT_AMBULATORY_CARE_PROVIDER_SITE_OTHER): Payer: BC Managed Care – PPO | Admitting: Internal Medicine

## 2013-07-16 ENCOUNTER — Encounter: Payer: Self-pay | Admitting: Internal Medicine

## 2013-07-16 VITALS — BP 110/76 | HR 71 | Temp 98.6°F | Ht 65.5 in | Wt 218.5 lb

## 2013-07-16 DIAGNOSIS — E669 Obesity, unspecified: Secondary | ICD-10-CM

## 2013-07-16 DIAGNOSIS — Z1211 Encounter for screening for malignant neoplasm of colon: Secondary | ICD-10-CM

## 2013-07-16 DIAGNOSIS — Z23 Encounter for immunization: Secondary | ICD-10-CM

## 2013-07-16 DIAGNOSIS — Z0000 Encounter for general adult medical examination without abnormal findings: Secondary | ICD-10-CM

## 2013-07-16 MED ORDER — PHENTERMINE HCL 37.5 MG PO CAPS: 37.5000 mg | ORAL_CAPSULE | ORAL | Status: DC

## 2013-07-16 NOTE — Addendum Note (Signed)
Addended by: Criselda Peaches B on: 07/16/2013 03:10 PM   Modules accepted: Orders

## 2013-07-16 NOTE — Assessment & Plan Note (Signed)
Counseled on diet and exercise Wants to restart phentermine-benefits and risks discussed  RTC in 1 month to evaluate medication effectiveness

## 2013-07-16 NOTE — Progress Notes (Signed)
HPI  Pt presents to the clinic today for her annual exam. She does have some concerns about her weight. She is down 5 lb in the last 4 months. She is not currently doing any diet or exercise. She does eat fast food a lot.  Flu: 2013 Tetanus: 2007 LMP: post menopausal Pap Smear: 2013, done at Dr. Logan Bores at [facility] Mammogram: 06/2013 Colonoscopy: never had one Eye Doctor: yearly Dentist: bianually   History reviewed. No pertinent past medical history.  Current Outpatient Prescriptions  Medication Sig Dispense Refill  . diclofenac (VOLTAREN) 75 MG EC tablet Take 1 tablet (75 mg total) by mouth 2 (two) times daily.  30 tablet  0  . Fluticasone Propionate, Inhal, (FLOVENT DISKUS) 100 MCG/BLIST AEPB One inhalation twice daily       . Fluticasone-Salmeterol (ADVAIR) 250-50 MCG/DOSE AEPB Inhale 1 puff into the lungs 2 (two) times daily.  60 each  11  . oxybutynin (DITROPAN-XL) 10 MG 24 hr tablet Take 1 tablet (10 mg total) by mouth 2 (two) times daily.  60 tablet  6  . SUMAtriptan (IMITREX) 100 MG tablet Take one at onset of headache      . VENTOLIN HFA 108 (90 BASE) MCG/ACT inhaler USE TWO (2) PUFFS EVERY SIX HOURS AS NEEDED FOR WHEEZING  18 g  5  . albuterol (PROVENTIL) (2.5 MG/3ML) 0.083% nebulizer solution Take 3 mLs (2.5 mg total) by nebulization once.  150 mL  1  . montelukast (SINGULAIR) 10 MG tablet Take 1 tablet (10 mg total) by mouth at bedtime.  30 tablet  11  . phentermine 15 MG capsule Take 1 capsule (15 mg total) by mouth every morning.  30 capsule  0   Current Facility-Administered Medications  Medication Dose Route Frequency Provider Last Rate Last Dose  . ipratropium (ATROVENT) nebulizer solution 0.5 mg  0.5 mg Nebulization Once Dianne Dun, MD        Allergies  Allergen Reactions  . Codeine Sulfate     REACTION: Hives, can't breathe    Family History  Problem Relation Age of Onset  . Hypertension Mother   . Diverticulitis Mother   . Fibromyalgia Mother   . Cancer  Sister     uterine  . Diabetes Maternal Uncle   . Heart disease Maternal Uncle   . Asthma Maternal Grandmother   . Heart disease Maternal Grandmother   . Emphysema Maternal Grandmother     History   Social History  . Marital Status: Married    Spouse Name: N/A    Number of Children: 3  . Years of Education: N/A   Occupational History  . Not on file.   Social History Main Topics  . Smoking status: Never Smoker   . Smokeless tobacco: Not on file  . Alcohol Use: Not on file  . Drug Use: Not on file  . Sexual Activity: Yes   Other Topics Concern  . 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  HEENT: Denies eye pain, eye redness, ear pain, ringing in the ears, wax buildup, runny nose, nasal congestion, bloody nose, or sore throat. Respiratory: Denies difficulty breathing, shortness of breath, cough or sputum production.   Cardiovascular: Denies chest pain, chest tightness, palpitations or swelling  in the hands or feet.  Gastrointestinal: Denies abdominal pain, bloating, constipation, diarrhea or blood in the stool.  GU: Denies frequency, urgency, pain with urination, blood in urine, odor or discharge. Musculoskeletal: Pt reports right knee pain from old injury. Denies decrease in range of motion, difficulty with gait, muscle pain or joint swelling.  Skin: Denies redness, rashes, lesions or ulcercations.  Neurological: Denies dizziness, difficulty with memory, difficulty with speech or problems with balance and coordination.   No other specific complaints in a complete review of systems (except as listed in HPI above).  PE:  BP 110/76  Pulse 71  Temp(Src) 98.6 F (37 C) (Oral)  Ht 5' 5.5" (1.664 m)  Wt 218 lb 8 oz (99.111  kg)  BMI 35.79 kg/m2  SpO2 98% Wt Readings from Last 3 Encounters:  07/16/13 218 lb 8 oz (99.111 kg)  02/21/13 223 lb 8 oz (101.379 kg)  01/24/13 214 lb (97.07 kg)    General: Appears her stated age, obese but well developed, well nourished in NAD. HEENT: Head: normal shape and size; Eyes: sclera white, no icterus, conjunctiva pink, PERRLA and EOMs intact; Ears: Tm's gray and intact, normal light reflex; Nose: mucosa pink and moist, septum midline; Throat/Mouth: Teeth present, mucosa pink and moist, no lesions or ulcerations noted.  Neck: Normal range of motion. Neck supple, trachea midline. No massses, lumps or thyromegaly present.  Cardiovascular: Normal rate and rhythm. S1,S2 noted.  No murmur, rubs or gallops noted. No JVD or BLE edema. No carotid bruits noted. Pulmonary/Chest: Normal effort and positive vesicular breath sounds. No respiratory distress. No wheezes, rales or ronchi noted.  Abdomen: Soft and nontender. Normal bowel sounds, no bruits noted. No distention or masses noted. Liver, spleen and kidneys non palpable. Musculoskeletal: Normal range of motion. No signs of joint swelling. No difficulty with gait.  Neurological: Alert and oriented. Cranial nerves II-XII intact. Coordination normal. +DTRs bilaterally. Psychiatric: Mood and affect normal. Behavior is normal. Judgment and thought content normal.    BMET    Component Value Date/Time   NA 138 07/04/2013 0827   K 4.3 07/04/2013 0827   CL 107 07/04/2013 0827   CO2 27 07/04/2013 0827   GLUCOSE 86 07/04/2013 0827   BUN 20 07/04/2013 0827   CREATININE 0.9 07/04/2013 0827   CALCIUM 9.0 07/04/2013 0827   GFRNONAA 81.49 04/08/2010 0831   GFRAA 77 05/16/2007 0947    Lipid Panel     Component Value Date/Time   CHOL 163 07/04/2013 0827   TRIG 203.0* 07/04/2013 0827   HDL 36.80* 07/04/2013 0827   CHOLHDL 4 07/04/2013 0827   VLDL 40.6* 07/04/2013 0827   LDLCALC 118* 06/27/2012 0836    CBC    Component Value Date/Time   WBC 9.6  07/04/2013 0827   RBC 4.61 07/04/2013 0827   HGB 14.5 07/04/2013 0827   HCT 42.2 07/04/2013 0827   PLT 251.0 07/04/2013 0827   MCV 91.5 07/04/2013 0827   MCHC 34.3 07/04/2013 0827   RDW 12.8 07/04/2013 0827   LYMPHSABS 3.3 07/04/2013 0827   MONOABS 1.0 07/04/2013 0827   EOSABS 0.5 07/04/2013 0827   BASOSABS 0.1 07/04/2013 0827    Hgb A1C No results found for this basename: HGBA1C     Assessment and Plan:  Prevent Health Maintenance:  Flu shot today Has appt to have her pap smear done Labs reviewed- high triglycerides. Counseled on diet and exercise Will schedule colonoscopy screening  RTC in 1 year or sooner if needed

## 2013-07-16 NOTE — Patient Instructions (Signed)

## 2013-07-18 ENCOUNTER — Encounter: Payer: Self-pay | Admitting: Gastroenterology

## 2013-08-04 ENCOUNTER — Other Ambulatory Visit: Payer: Self-pay | Admitting: *Deleted

## 2013-08-04 MED ORDER — MONTELUKAST SODIUM 10 MG PO TABS: 10.0000 mg | ORAL_TABLET | Freq: Every day | ORAL | Status: DC

## 2013-08-18 ENCOUNTER — Encounter: Payer: Self-pay | Admitting: Internal Medicine

## 2013-08-18 ENCOUNTER — Ambulatory Visit (INDEPENDENT_AMBULATORY_CARE_PROVIDER_SITE_OTHER): Payer: BC Managed Care – PPO | Admitting: Internal Medicine

## 2013-08-18 VITALS — BP 106/70 | HR 85 | Temp 98.1°F | Ht 65.5 in | Wt 216.5 lb

## 2013-08-18 DIAGNOSIS — J45901 Unspecified asthma with (acute) exacerbation: Secondary | ICD-10-CM

## 2013-08-18 MED ORDER — PREDNISONE 10 MG PO TABS: ORAL_TABLET | ORAL | Status: DC

## 2013-08-18 MED ORDER — BENZONATATE 200 MG PO CAPS: 200.0000 mg | ORAL_CAPSULE | Freq: Two times a day (BID) | ORAL | Status: DC | PRN

## 2013-08-18 NOTE — Progress Notes (Signed)
HPI  Pt presents to the clinic today with c/o cough and occasional shortness of breath. This started about 1 week ago. The cough is dry and nonproductive. She denies fever, chills or body aches. She does have a history of allergies and asthma. She does take Advair, albuterol and singulair. She does report a asthma flare each year around this time. Seems that she has an allergy trigger. She has not had sick contacts.  Review of Systems     History reviewed. No pertinent past medical history.  Family History  Problem Relation Age of Onset  . Hypertension Mother   . Diverticulitis Mother   . Fibromyalgia Mother   . Cancer Sister     uterine  . Diabetes Maternal Uncle   . Heart disease Maternal Uncle   . Asthma Maternal Grandmother   . Heart disease Maternal Grandmother   . Emphysema Maternal Grandmother     History   Social History  . Marital Status: Married    Spouse Name: N/A    Number of Children: 3  . Years of Education: N/A   Occupational History  . Not on file.   Social History Main Topics  . Smoking status: Never Smoker   . Smokeless tobacco: Not on file  . Alcohol Use: Not on file  . Drug Use: Not on file  . Sexual Activity: Yes   Other Topics Concern  . 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 Codeine Sulfate     REACTION: Hives, can't breathe     Constitutional: Positive headache, fatigue. Denies fever or abrupt weight changes.  HEENT:  Denies eye redness, eye pain, pressure behind the eyes, facial pain, nasal congestion, ear pain, ringing in the ears, wax buildup, runny nose or bloody nose. Respiratory: Positive cough. Denies difficulty breathing.  Cardiovascular: Denies chest pain, chest tightness, palpitations or swelling in the hands or feet.   No  other specific complaints in a complete review of systems (except as listed in HPI above).  Objective:   BP 106/70  Pulse 85  Temp(Src) 98.1 F (36.7 C) (Oral)  Ht 5' 5.5" (1.664 m)  Wt 216 lb 8 oz (98.204 kg)  BMI 35.47 kg/m2  SpO2 96% Wt Readings from Last 3 Encounters:  08/18/13 216 lb 8 oz (98.204 kg)  07/16/13 218 lb 8 oz (99.111 kg)  02/21/13 223 lb 8 oz (101.379 kg)     General: Appears her stated age, well developed, well nourished in NAD. HEENT: Head: normal shape and size; Eyes: sclera white, no icterus, conjunctiva pink, PERRLA and EOMs intact; Ears: Tm's gray and intact, normal light reflex; Nose: mucosa pink and moist, septum midline; Throat/Mouth: + PND. Teeth present, mucosa erythematous and moist, no exudate noted, no lesions or ulcerations noted.  Neck: Mild cervical lymphadenopathy. Neck supple, trachea midline. No massses, lumps or thyromegaly present.  Cardiovascular: Normal rate and rhythm. S1,S2 noted.  No murmur, rubs or gallops noted. No JVD or BLE edema. No carotid bruits noted. Pulmonary/Chest: Normal effort and intermittent expiratory wheeze noted. No respiratory distress. No rales or ronchi noted.      Assessment & Plan:   Asthma with acute exacerbation, likely allergy triggered:  Continue Advair, albuterol and singulair eRx for pred taper x 6 days  eRx for  tessalon pearles  RTC as needed or if symptoms persist.

## 2013-08-18 NOTE — Patient Instructions (Signed)
Asthma, Acute Bronchospasm Your exam shows you have asthma, or acute bronchospasm that acts like asthma. Bronchospasm means your air passages become narrowed. These conditions are due to inflammation and airway spasm that cause narrowing of the bronchial tubes in the lungs. This causes you to have wheezing and shortness of breath. POSSIBLE TRIGGERS  Animal dander from the skin, hair, or feathers of animals.  Dust mites contained in house dust.  Cockroaches.  Pollen from trees or grass.  Mold.  Cigarette or tobacco smoke.  Air pollutants such as dust, household cleaners, hair sprays, aerosol sprays, paint fumes, strong chemicals, or strong odors.  Cold air or weather changes. Cold air may cause inflammation. Winds increase molds and pollens in the air.  Strong emotions such as crying or laughing hard.  Stress.  Certain medicines such as aspirin or beta-blockers.  Sulfites in such foods and drinks as dried fruits and wine.  Infections or inflammatory conditions such as a flu, cold, or inflammation of the nasal membranes (rhinitis).  Gastroesophageal reflux disease (GERD). GERD is a condition where stomach acid backs up into your throat (esophagus).  Exercise or strenous activity. TREATMENT  Treatment is aimed at making the narrowed airways larger. Mild asthma or bronchospasm is usually controlled with inhaled medicines. Albuterol is a common medicine that you breathe in to open spastic or narrowed airways. Steroid medicine is also used to reduce the inflammation when an attack is moderate or severe. Antibiotics are only used if a bacterial infection is present.  HOME CARE INSTRUCTIONS   Rest.  Drink plenty of liquids. This helps the mucus to remain thin and easily coughed up. Do not use caffeine or alcohol.  Do not smoke. Avoid being exposed to secondhand smoke.  You play a critical role in keeping yourself in good health. Avoid exposure to things that cause you to wheeze.  Avoid exposure to things that cause you to have breathing problems. Keep your medicines up-to-date and available. Carefully follow your caregiver's treatment plan.  When pollen or pollution is bad, keep windows closed and use an air conditioner or go to places with air conditioning.  Take your medicine exactly as prescribed.  Asthma requires careful medical attention. See your caregiver for follow-up as advised. If you are more than [redacted] weeks pregnant and you were prescribed any new medicines, let your obstetrician know about the visit and how you are doing. Arrange a recheck. SEEK IMMEDIATE MEDICAL CARE IF:   You are getting worse.  You have trouble breathing. If severe, call your local emergency services 911 in [place]  You develop chest pain or discomfort.  You are throwing up or not drinking fluids.  You are coughing up yellow, green, brown, or bloody sputum.  You have a fever or persistent symptoms for more than 2 3 days.  You have a fever and your symptoms suddenly get worse.  You have trouble swallowing. MAKE SURE YOU:   Understand these instructions.  Will watch your condition.  Will get help right away if you are not doing well or get worse. Document Released: 01/17/2007 Document Revised: 09/18/2012 Document Reviewed: 09/16/2007 Exit[employer] Patient Information 2014 [employer].  

## 2013-08-21 ENCOUNTER — Ambulatory Visit: Payer: BC Managed Care – PPO | Admitting: Internal Medicine

## 2013-08-22 ENCOUNTER — Ambulatory Visit: Payer: BC Managed Care – PPO | Admitting: Internal Medicine

## 2013-08-25 ENCOUNTER — Other Ambulatory Visit: Payer: BC Managed Care – PPO

## 2013-08-29 ENCOUNTER — Ambulatory Visit (INDEPENDENT_AMBULATORY_CARE_PROVIDER_SITE_OTHER): Payer: BC Managed Care – PPO | Admitting: Internal Medicine

## 2013-08-29 ENCOUNTER — Encounter: Payer: Self-pay | Admitting: Internal Medicine

## 2013-08-29 VITALS — BP 116/70 | HR 101 | Temp 98.2°F | Wt 219.8 lb

## 2013-08-29 DIAGNOSIS — E669 Obesity, unspecified: Secondary | ICD-10-CM

## 2013-08-29 MED ORDER — PHENTERMINE HCL 37.5 MG PO CAPS: 37.5000 mg | ORAL_CAPSULE | ORAL | Status: DC

## 2013-08-29 NOTE — Patient Instructions (Signed)

## 2013-08-29 NOTE — Progress Notes (Signed)
HPI: Pt presents to office today for weight check for starting phentermine last month. Pt has gained three pound since last visit. Pt states she recently on Prednisone for 7 days and accounts this for her weight gain. Pt endorses walking twice a week, she has switched to eating more salads, and stopped drinking soda. She endorses she feels less hungry, but has not truly controlled her portion sizes. She denies any side effects, headaches, dizziness, or chest pain.   History reviewed. No pertinent past medical history.  Current Outpatient Prescriptions  Medication Sig Dispense Refill  . benzonatate (TESSALON) 200 MG capsule Take 1 capsule (200 mg total) by mouth 2 (two) times daily as needed for cough.  20 capsule  0  . diclofenac (VOLTAREN) 75 MG EC tablet Take 1 tablet (75 mg total) by mouth 2 (two) times daily.  30 tablet  0  . Fluticasone Propionate, Inhal, (FLOVENT DISKUS) 100 MCG/BLIST AEPB One inhalation twice daily       . Fluticasone-Salmeterol (ADVAIR) 250-50 MCG/DOSE AEPB Inhale 1 puff into the lungs 2 (two) times daily.  60 each  11  . montelukast (SINGULAIR) 10 MG tablet Take 1 tablet (10 mg total) by mouth at bedtime.  30 tablet  11  . oxybutynin (DITROPAN-XL) 10 MG 24 hr tablet Take 1 tablet (10 mg total) by mouth 2 (two) times daily.  60 tablet  6  . phentermine 37.5 MG capsule Take 1 capsule (37.5 mg total) by mouth every morning.  30 capsule  0  . predniSONE (DELTASONE) 10 MG tablet Take 3 tablets on days 1-2, take 2 tabs on days 3-4 ,take 1 tabs on days 5-6  12 tablet  0  . SUMAtriptan (IMITREX) 100 MG tablet Take one at onset of headache      . VENTOLIN HFA 108 (90 BASE) MCG/ACT inhaler USE TWO (2) PUFFS EVERY SIX HOURS AS NEEDED FOR WHEEZING  18 g  5  . albuterol (PROVENTIL) (2.5 MG/3ML) 0.083% nebulizer solution Take 3 mLs (2.5 mg total) by nebulization once.  150 mL  1   Current Facility-Administered Medications  Medication Dose Route Frequency Provider Last Rate Last Dose   . ipratropium (ATROVENT) nebulizer solution 0.5 mg  0.5 mg Nebulization Once Dianne Dun, MD        Allergies  Allergen Reactions  . Codeine Sulfate     REACTION: Hives, can't breathe  . Latex Hives    Family History  Problem Relation Age of Onset  . Hypertension Mother   . Diverticulitis Mother   . Fibromyalgia Mother   . Cancer Sister     uterine  . Diabetes Maternal Uncle   . Heart disease Maternal Uncle   . Asthma Maternal Grandmother   . Heart disease Maternal Grandmother   . Emphysema Maternal Grandmother     History   Social History  . Marital Status: Married    Spouse Name: N/A    Number of Children: 3  . Years of Education: N/A   Occupational History  . Not on file.   Social History Main Topics  . Smoking status: Never Smoker   . Smokeless tobacco: Not on file  . Alcohol Use: Not on file  . Drug Use: Not on file  . Sexual Activity: Yes   Other Topics Concern  . 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 Marland Kitchen Respiratory: Denies difficulty breathing, shortness of breath, cough or sputum production.   Cardiovascular: Denies chest pain, chest tightness, palpitations or swelling in the hands or feet.  Gastrointestinal: Denies abdominal pain, bloating, constipation, diarrhea or blood in the stool.   Neurological: Denies dizziness, difficulty with memory, difficulty with speech or problems with balance and coordination.   No other specific complaints in a complete review of systems (except as listed in HPI above).  PE:  BP 116/70  Pulse 101  Temp(Src) 98.2 F (36.8 C) (Oral)  Wt 219 lb 12.8 oz (99.701 kg)  SpO2 96% Wt Readings from Last 3 Encounters:  08/29/13 219 lb 12.8 oz (99.701 kg)  08/18/13  216 lb 8 oz (98.204 kg)  07/16/13 218 lb 8 oz (99.111 kg)    General: Appears their stated age, well developed, well nourished in NAD.Marland Kitchen  Cardiovascular: Normal rate and rhythm. S1,S2 noted.  No murmur, rubs or gallops noted. No JVD or BLE edema. No carotid bruits noted. Pulmonary/Chest: Normal effort and positive vesicular breath sounds. No respiratory distress. No wheezes, rales or ronchi noted.  Abdomen: Soft and nontender. Normal bowel sounds, no bruits noted. No distention or masses noted. Liver, spleen and kidneys non palpable. Psychiatric: Mood and affect normal. Behavior is normal. Judgment and thought content normal.   Assessment and Plan: Weight check Will refill Phentermine today; follow up in one month for re-check, if not lower will have to discontinue Educated patient on increased exercise of at least 4 times a week for Educated on diet changes; stop drinking daily chocolate mochas, decrease ranch dressing, and monitor portion control Follow up in one month for weight check  Jenissa Tyrell S, Student-NP

## 2013-08-29 NOTE — Progress Notes (Signed)
Subjective:    Patient ID: Jamie Rowland, female    DOB: 1960/12/07, 52 y.o.   MRN: [id number]  HPI  Pt presents to the clinic today for 1 month followup for weight check/phentermine. She started it last month. Since that time, she has gained 3 lbs. She did have to take prednisone for 7 days last week. She is also only exercising for 15 minutes twice weekly. She does drink does drink a white chocolate mocha every morning. She also eats a salad for lunch every day doused in ranch dressing.  Review of Systems      History reviewed. No pertinent past medical history.  Current Outpatient Prescriptions  Medication Sig Dispense Refill  . benzonatate (TESSALON) 200 MG capsule Take 1 capsule (200 mg total) by mouth 2 (two) times daily as needed for cough.  20 capsule  0  . diclofenac (VOLTAREN) 75 MG EC tablet Take 1 tablet (75 mg total) by mouth 2 (two) times daily.  30 tablet  0  . Fluticasone Propionate, Inhal, (FLOVENT DISKUS) 100 MCG/BLIST AEPB One inhalation twice daily       . Fluticasone-Salmeterol (ADVAIR) 250-50 MCG/DOSE AEPB Inhale 1 puff into the lungs 2 (two) times daily.  60 each  11  . montelukast (SINGULAIR) 10 MG tablet Take 1 tablet (10 mg total) by mouth at bedtime.  30 tablet  11  . oxybutynin (DITROPAN-XL) 10 MG 24 hr tablet Take 1 tablet (10 mg total) by mouth 2 (two) times daily.  60 tablet  6  . phentermine 37.5 MG capsule Take 1 capsule (37.5 mg total) by mouth every morning.  30 capsule  0  . predniSONE (DELTASONE) 10 MG tablet Take 3 tablets on days 1-2, take 2 tabs on days 3-4 ,take 1 tabs on days 5-6  12 tablet  0  . SUMAtriptan (IMITREX) 100 MG tablet Take one at onset of headache      . VENTOLIN HFA 108 (90 BASE) MCG/ACT inhaler USE TWO (2) PUFFS EVERY SIX HOURS AS NEEDED FOR WHEEZING  18 g  5  . albuterol (PROVENTIL) (2.5 MG/3ML) 0.083% nebulizer solution Take 3 mLs (2.5 mg total) by nebulization once.  150 mL  1   Current Facility-Administered Medications   Medication Dose Route Frequency Provider Last Rate Last Dose  . ipratropium (ATROVENT) nebulizer solution 0.5 mg  0.5 mg Nebulization Once Dianne Dun, MD        Allergies  Allergen Reactions  . Codeine Sulfate     REACTION: Hives, can't breathe  . Latex Hives    Family History  Problem Relation Age of Onset  . Hypertension Mother   . Diverticulitis Mother   . Fibromyalgia Mother   . Cancer Sister     uterine  . Diabetes Maternal Uncle   . Heart disease Maternal Uncle   . Asthma Maternal Grandmother   . Heart disease Maternal Grandmother   . Emphysema Maternal Grandmother     History   Social History  . Marital Status: Married    Spouse Name: N/A    Number of Children: 3  . Years of Education: N/A   Occupational History  . Not on file.   Social History Main Topics  . Smoking status: Never Smoker   . Smokeless tobacco: Not on file  . Alcohol Use: Not on file  . Drug Use: Not on file  . Sexual Activity: Yes   Other Topics Concern  . 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  Respiratory: Denies difficulty breathing, shortness of breath, cough or sputum production.   Cardiovascular: Denies chest pain, chest tightness, palpitations or swelling in the hands or feet.   Neurological: Denies dizziness, difficulty with memory, difficulty with speech or problems with balance and coordination.   No other specific complaints in a complete review of systems (except as listed in HPI above).  Objective:   Physical Exam   BP 116/70  Pulse 101  Temp(Src) 98.2 F (36.8 C) (Oral)  Wt 219 lb 12.8 oz (99.701 kg)  SpO2 96% Wt Readings from Last 3 Encounters:  08/29/13 219 lb 12.8 oz (99.701 kg)  08/18/13 216 lb 8 oz  (98.204 kg)  07/16/13 218 lb 8 oz (99.111 kg)    General: Appears her stated age, overweight but well developed, well nourished in NAD. Cardiovascular: Mildly tachycardic. S1,S2 noted.  No murmur, rubs or gallops noted. No JVD or BLE edema. No carotid bruits noted. Pulmonary/Chest: Normal effort and positive vesicular breath sounds. No respiratory distress. No wheezes, rales or ronchi noted.   Neurological: Alert and oriented. Cranial nerves II-XII intact. Coordination normal. +DTRs bilaterally.     BMET    Component Value Date/Time   NA 138 07/04/2013 0827   K 4.3 07/04/2013 0827   CL 107 07/04/2013 0827   CO2 27 07/04/2013 0827   GLUCOSE 86 07/04/2013 0827   BUN 20 07/04/2013 0827   CREATININE 0.9 07/04/2013 0827   CALCIUM 9.0 07/04/2013 0827   GFRNONAA 81.49 04/08/2010 0831   GFRAA 77 05/16/2007 0947    Lipid Panel     Component Value Date/Time   CHOL 163 07/04/2013 0827   TRIG 203.0* 07/04/2013 0827   HDL 36.80* 07/04/2013 0827   CHOLHDL 4 07/04/2013 0827   VLDL 40.6* 07/04/2013 0827   LDLCALC 118* 06/27/2012 0836    CBC    Component Value Date/Time   WBC 9.6 07/04/2013 0827   RBC 4.61 07/04/2013 0827   HGB 14.5 07/04/2013 0827   HCT 42.2 07/04/2013 0827   PLT 251.0 07/04/2013 0827   MCV 91.5 07/04/2013 0827   MCHC 34.3 07/04/2013 0827   RDW 12.8 07/04/2013 0827   LYMPHSABS 3.3 07/04/2013 0827   MONOABS 1.0 07/04/2013 0827   EOSABS 0.5 07/04/2013 0827   BASOSABS 0.1 07/04/2013 0827    Hgb A1C No results found for this basename: HGBA1C        Assessment & Plan:

## 2013-08-29 NOTE — Assessment & Plan Note (Addendum)
No improvement with phentermine Pt counseled on importance of diet and exercise She does plan to join the rush for exercising Will refill phentermine this month but if no improvement at next weight check- will stop medication

## 2013-09-15 ENCOUNTER — Other Ambulatory Visit: Payer: Self-pay | Admitting: *Deleted

## 2013-09-15 MED ORDER — OXYBUTYNIN CHLORIDE ER 10 MG PO TB24: 10.0000 mg | ORAL_TABLET | Freq: Two times a day (BID) | ORAL | Status: DC

## 2013-09-15 NOTE — Telephone Encounter (Signed)
Received faxed refill request from pharmacy. Refill sent to pharmacy electronically. 

## 2013-09-26 ENCOUNTER — Encounter: Payer: BC Managed Care – PPO | Admitting: Gastroenterology

## 2013-10-02 ENCOUNTER — Ambulatory Visit: Payer: BC Managed Care – PPO | Admitting: Internal Medicine

## 2013-10-06 ENCOUNTER — Ambulatory Visit: Payer: BC Managed Care – PPO | Admitting: Internal Medicine

## 2013-11-04 ENCOUNTER — Ambulatory Visit (AMBULATORY_SURGERY_CENTER): Payer: BC Managed Care – PPO | Admitting: *Deleted

## 2013-11-04 VITALS — Ht 65.5 in | Wt 214.8 lb

## 2013-11-04 DIAGNOSIS — Z1211 Encounter for screening for malignant neoplasm of colon: Secondary | ICD-10-CM

## 2013-11-04 MED ORDER — NA SULFATE-K SULFATE-MG SULF 17.5-3.13-1.6 GM/177ML PO SOLN: 1.0000 | Freq: Once | ORAL | Status: DC

## 2013-11-04 NOTE — Progress Notes (Signed)
No allergies to eggs or soy. No problems with anesthesia.  

## 2013-11-05 ENCOUNTER — Encounter: Payer: Self-pay | Admitting: Gastroenterology

## 2013-11-11 ENCOUNTER — Ambulatory Visit (INDEPENDENT_AMBULATORY_CARE_PROVIDER_SITE_OTHER): Payer: BC Managed Care – PPO | Admitting: Internal Medicine

## 2013-11-11 ENCOUNTER — Encounter: Payer: Self-pay | Admitting: Internal Medicine

## 2013-11-11 VITALS — BP 118/84 | HR 88 | Temp 98.5°F | Wt 213.0 lb

## 2013-11-11 DIAGNOSIS — J45901 Unspecified asthma with (acute) exacerbation: Secondary | ICD-10-CM

## 2013-11-11 MED ORDER — BENZONATATE 100 MG PO CAPS: 100.0000 mg | ORAL_CAPSULE | Freq: Two times a day (BID) | ORAL | Status: DC | PRN

## 2013-11-11 MED ORDER — PREDNISONE 10 MG PO TABS: ORAL_TABLET | ORAL | Status: DC

## 2013-11-11 NOTE — Patient Instructions (Signed)
Asthma, Acute Bronchospasm °Acute bronchospasm caused by asthma is also referred to as an asthma attack. Bronchospasm means your air passages become narrowed. The narrowing is caused by inflammation and tightening of the muscles in the air tubes (bronchi) in your lungs. This can make it hard to breath or cause you to wheeze and cough. °CAUSES °Possible triggers are: °· Animal dander from the skin, hair, or feathers of animals. °· Dust mites contained in house dust. °· Cockroaches. °· Pollen from trees or grass. °· Mold. °· Cigarette or tobacco smoke. °· Air pollutants such as dust, household cleaners, hair sprays, aerosol sprays, paint fumes, strong chemicals, or strong odors. °· Cold air or weather changes. Cold air may trigger inflammation. Winds increase molds and pollens in the air. °· Strong emotions such as crying or laughing hard. °· Stress. °· Certain medicines such as aspirin or beta-blockers. °· Sulfites in foods and drinks, such as dried fruits and wine. °· Infections or inflammatory conditions, such as a flu, cold, or inflammation of the nasal membranes (rhinitis). °· Gastroesophageal reflux disease (GERD). GERD is a condition where stomach acid backs up into your throat (esophagus). °· Exercise or strenuous activity. °SIGNS AND SYMPTOMS  °· Wheezing. °· Excessive coughing, particularly at night. °· Chest tightness. °· Shortness of breath. °DIAGNOSIS  °Your health care provider will ask you about your medical history and perform a physical exam. A chest X-ray or blood testing may be performed to look for other causes of your symptoms or other conditions that may have triggered your asthma attack.  °TREATMENT  °Treatment is aimed at reducing inflammation and opening up the airways in your lungs.  Most asthma attacks are treated with inhaled medicines. These include quick relief or rescue medicines (such as bronchodilators) and controller medicines (such as inhaled corticosteroids). These medicines are  sometimes given through an inhaler or a nebulizer. Systemic steroid medicine taken by mouth or given through an IV tube also can be used to reduce the inflammation when an attack is moderate or severe. Antibiotic medicines are only used if a bacterial infection is present.  °HOME CARE INSTRUCTIONS  °· Rest. °· Drink plenty of liquids. This helps the mucus to remain thin and be easily coughed up. Only use caffeine in moderation and do not use alcohol until you have recovered from your illness. °· Do not smoke. Avoid being exposed to secondhand smoke. °· You play a critical role in keeping yourself in good health. Avoid exposure to things that cause you to wheeze or to have breathing problems. °· Keep your medicines up to date and available. Carefully follow your health care provider's treatment plan. °· Take your medicine exactly as prescribed. °· When pollen or pollution is bad, keep windows closed and use an air conditioner or go to places with air conditioning. °· Asthma requires careful medical care. See your health care provider for a follow-up as advised. If you are more than [redacted] weeks pregnant and you were prescribed any new medicines, let your obstetrician know about the visit and how you are doing. Follow-up with your health care provider as directed. °· After you have recovered from your asthma attack, make an appointment with your outpatient doctor to talk about ways to reduce the likelihood of future attacks. If you do not have a doctor who manages your asthma, make an appointment with a primary care doctor to discuss your asthma. °SEEK IMMEDIATE MEDICAL CARE IF:  °· You are getting worse. °· You have trouble breathing. If severe, call   your local emergency services (911 in the [place] °· You develop chest pain or discomfort. °· You are vomiting. °· You are not able to keep fluids down. °· You are coughing up yellow, green, brown, or bloody sputum. °· You have a fever and your symptoms suddenly get  worse. °· You have trouble swallowing. °MAKE SURE YOU:  °· Understand these instructions. °· Will watch your condition. °· Will get help right away if you are not doing well or get worse. °Document Released: 01/17/2007 Document Revised: 06/04/2013 Document Reviewed: 04/09/2013 °[employer]® Patient Information ©2014 [employer]. ° °

## 2013-11-11 NOTE — Progress Notes (Signed)
HPI: Pt presents with concerns today regarding shortness of breath and cough. Pt has history of asthma and endorses having problems during season changes. She was seen in November for the same issue. She endorses shortness of breath, wheezing, and dry cough. She denies runny nose, congestion, sputum production, fever, chills, or fatigue. She has been around sick contacts.   Past Medical History  Diagnosis Date  . Asthma   . Seasonal allergies   . Migraine headache     Current Outpatient Prescriptions  Medication Sig Dispense Refill  . albuterol (PROVENTIL) (2.5 MG/3ML) 0.083% nebulizer solution Take 2.5 mg by nebulization every 6 (six) hours as needed for wheezing or shortness of breath.      . Fluticasone Propionate, Inhal, (FLOVENT DISKUS) 100 MCG/BLIST AEPB One inhalation twice daily       . Fluticasone-Salmeterol (ADVAIR) 250-50 MCG/DOSE AEPB Inhale 1 puff into the lungs 2 (two) times daily.  60 each  11  . montelukast (SINGULAIR) 10 MG tablet Take 1 tablet (10 mg total) by mouth at bedtime.  30 tablet  11  . Na Sulfate-K Sulfate-Mg Sulf (SUPREP BOWEL PREP) SOLN Take 1 kit by mouth once. suprep as directed.  No substitutions  354 mL  0  . oxybutynin (DITROPAN-XL) 10 MG 24 hr tablet Take 1 tablet (10 mg total) by mouth 2 (two) times daily.  60 tablet  5  . SUMAtriptan (IMITREX) 100 MG tablet Take one at onset of headache      . VENTOLIN HFA 108 (90 BASE) MCG/ACT inhaler USE TWO (2) PUFFS EVERY SIX HOURS AS NEEDED FOR WHEEZING  18 g  5  . albuterol (PROVENTIL) (2.5 MG/3ML) 0.083% nebulizer solution Take 3 mLs (2.5 mg total) by nebulization once.  150 mL  1   Current Facility-Administered Medications  Medication Dose Route Frequency Provider Last Rate Last Dose  . ipratropium (ATROVENT) nebulizer solution 0.5 mg  0.5 mg Nebulization Once Lucille Passy, MD        Allergies  Allergen Reactions  . Codeine Sulfate     REACTION: Hives, can't breathe  . Latex Hives    Family History   Problem Relation Age of Onset  . Hypertension Mother   . Diverticulitis Mother   . Fibromyalgia Mother   . Cancer Sister     uterine  . Diabetes Maternal Uncle   . Heart disease Maternal Uncle   . Asthma Maternal Grandmother   . Heart disease Maternal Grandmother   . Emphysema Maternal Grandmother   . Colon cancer Neg Hx     History   Social History  . Marital Status: Married    Spouse Name: N/A    Number of Children: 3  . Years of Education: N/A   Occupational History  . Not on file.   Social History Main Topics  . Smoking status: Never Smoker   . Smokeless tobacco: Never Used  . Alcohol Use: No  . Drug Use: No  . Sexual Activity: Yes   Other Topics Concern  . 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  HEENT: Denies eye pain, eye redness, ear pain, ringing in the ears, wax buildup, runny nose, nasal congestion, bloody nose, or sore throat. Respiratory:Endorses shortness of breath, cough,  and wheezing.  Denies difficulty breathing or sputum production.   Cardiovascular: Denies chest pain, chest tightness, palpitations or swelling in the hands or feet.  Neurological: Denies dizziness, difficulty with memory, difficulty with speech or problems with balance and coordination.   No other specific complaints in a complete review of systems (except as listed in HPI above).  PE:  BP 118/84  Pulse 88  Temp(Src) 98.5 F (36.9 C) (Oral)  Wt 213 lb (96.616 kg)  SpO2 97% Wt Readings from Last 3 Encounters:  11/11/13 213 lb (96.616 kg)  11/04/13 214 lb 12.8 oz (97.433 kg)  08/29/13 219 lb 12.8 oz (99.701 kg)    General: Appears their stated age, well developed, well nourished in NAD. HEENT: Head: normal shape and  size; Eyes: sclera white, no icterus, conjunctiva pink, PERRLA and EOMs intact; Ears: Tm's gray and intact, normal light reflex; Nose: mucosa pink and moist, septum midline; Throat/Mouth: Teeth present, mucosa pink and moist, no lesions or ulcerations noted.  Neck: Normal range of motion. Neck supple, trachea midline. No massses, lumps or lymphadenopathy present.  Cardiovascular: Normal rate and rhythm. S1,S2 noted.  No murmur, rubs or gallops noted. No JVD or BLE edema. No carotid bruits noted. Pulmonary/Chest: Normal effort and positive vesicular breath sounds. No respiratory distress.bilateral upper expiratory wheezing present, No rales or ronchi noted.   Psychiatric: Mood and affect normal. Behavior is normal. Judgment and thought content normal.     Assessment and Plan: Asthma Exacerbation: Prednisone taper pack for 6 days Continue using inhalers, advair, flovent, and albuterol Rx for Tessalon pearles 231m PO TID prn for cough Follow up in 3-5 days if symptoms do not improve  Zyriah Mask S, Student-NP

## 2013-11-11 NOTE — Progress Notes (Signed)
Pre-visit discussion using our clinic review tool. No additional management support is needed unless otherwise documented below in the visit note.  

## 2013-11-11 NOTE — Progress Notes (Signed)
HPI  Pt presents to the clinic today with c/o cough and wheezing. This started 1 week ago. She does have some associated shortness of breath. The cough is unproductive. She has been using her nebulizer daily for the last week. She does have a history of allergies and asthma.  Review of Systems      Past Medical History  Diagnosis Date  . Asthma   . Seasonal allergies   . Migraine headache     Family History  Problem Relation Age of Onset  . Hypertension Mother   . Diverticulitis Mother   . Fibromyalgia Mother   . Cancer Sister     uterine  . Diabetes Maternal Uncle   . Heart disease Maternal Uncle   . Asthma Maternal Grandmother   . Heart disease Maternal Grandmother   . Emphysema Maternal Grandmother   . Colon cancer Neg Hx     History   Social History  . Marital Status: Married    Spouse Name: N/A    Number of Children: 3  . Years of Education: N/A   Occupational History  . Not on file.   Social History Main Topics  . Smoking status: Never Smoker   . Smokeless tobacco: Never Used  . Alcohol Use: No  . Drug Use: No  . Sexual Activity: Yes   Other Topics Concern  . 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 Codeine Sulfate     REACTION: Hives, can't breathe  . Latex Hives     Constitutional:  Denies headache, fatigue, fever or abrupt weight changes.  HEENT:  Denies eye redness, eye pain, pressure behind the eyes, facial pain, nasal congestion, ear pain, ringing in the ears, wax buildup, runny nose or bloody nose. Respiratory: Positive cough, wheezing and shortness of breath. Denies difficulty breathing.  Cardiovascular: Denies chest pain, chest tightness, palpitations or swelling in the hands or feet.   No other specific complaints in a complete review of  systems (except as listed in HPI above).  Objective:   BP 118/84  Pulse 88  Temp(Src) 98.5 F (36.9 C) (Oral)  Wt 213 lb (96.616 kg)  SpO2 97% Wt Readings from Last 3 Encounters:  11/11/13 213 lb (96.616 kg)  11/04/13 214 lb 12.8 oz (97.433 kg)  08/29/13 219 lb 12.8 oz (99.701 kg)     General: Appears her stated age, well developed, well nourished in NAD. HEENT: Head: normal shape and size; Eyes: sclera white, no icterus, conjunctiva pink, PERRLA and EOMs intact; Ears: Tm's gray and intact, normal light reflex; Nose: mucosa pink and moist, septum midline; Throat/Mouth: Teeth present, mucosa erythematous and moist, no exudate noted, no lesions or ulcerations noted.  Neck:  Neck supple, trachea midline. No massses, lumps or thyromegaly present.  Cardiovascular: Normal rate and rhythm. S1,S2 noted.  No murmur, rubs or gallops noted. No JVD or BLE edema. No carotid bruits noted. Pulmonary/Chest: Normal effort and bilateral expiratory wheezing, L>R. No respiratory distress. No wheezes, rales or ronchi noted.      Assessment & Plan:   Asthma Exacerbation:  Get some rest and drink plenty of water Do salt water gargles for the sore throat eRx for pred taper eRx for tessalon pearls Use your inhalers as prescribed Continue neb treatments but only if  absolutely necessary  RTC as needed or if symptoms persist.

## 2013-11-12 ENCOUNTER — Telehealth: Payer: Self-pay | Admitting: Gastroenterology

## 2013-11-12 NOTE — Telephone Encounter (Signed)
I spoke with pt and told her that these RXs wouldn't affect procedure.  I reminded her to bring in her inhaler for the procedure and to call if she starts to run a fever. Understanding voiced

## 2013-11-18 ENCOUNTER — Encounter: Payer: Self-pay | Admitting: Gastroenterology

## 2013-11-18 ENCOUNTER — Ambulatory Visit (AMBULATORY_SURGERY_CENTER): Payer: BC Managed Care – PPO | Admitting: Gastroenterology

## 2013-11-18 VITALS — BP 131/88 | HR 73 | Temp 96.4°F | Resp 13 | Ht 65.0 in | Wt 213.0 lb

## 2013-11-18 DIAGNOSIS — Z1211 Encounter for screening for malignant neoplasm of colon: Secondary | ICD-10-CM

## 2013-11-18 MED ORDER — SODIUM CHLORIDE 0.9 % IV SOLN: 500.0000 mL | INTRAVENOUS | Status: DC

## 2013-11-18 NOTE — Patient Instructions (Signed)
YOU HAD AN ENDOSCOPIC PROCEDURE TODAY AT THE [facility]: Refer to the procedure report that was given to you for any specific questions about what was found during the examination.  If the procedure report does not answer your questions, please call your gastroenterologist to clarify.  If you requested that your care partner not be given the details of your procedure findings, then the procedure report has been included in a sealed envelope for you to review at your convenience later.  YOU SHOULD EXPECT: Some feelings of bloating in the abdomen. Passage of more gas than usual.  Walking can help get rid of the air that was put into your GI tract during the procedure and reduce the bloating. If you had a lower endoscopy (such as a colonoscopy or flexible sigmoidoscopy) you may notice spotting of blood in your stool or on the toilet paper. If you underwent a bowel prep for your procedure, then you may not have a normal bowel movement for a few days.  DIET: Your first meal following the procedure should be a light meal and then it is ok to progress to your normal diet.  A half-sandwich or bowl of soup is an example of a good first meal.  Heavy or fried foods are harder to digest and may make you feel nauseous or bloated.  Likewise meals heavy in dairy and vegetables can cause extra gas to form and this can also increase the bloating.  Drink plenty of fluids but you should avoid alcoholic beverages for 24 hours.  ACTIVITY: Your care partner should take you home directly after the procedure.  You should plan to take it easy, moving slowly for the rest of the day.  You can resume normal activity the day after the procedure however you should NOT DRIVE or use heavy machinery for 24 hours (because of the sedation medicines used during the test).    SYMPTOMS TO REPORT IMMEDIATELY: A gastroenterologist can be reached at any hour.  During normal business hours, 8:30 AM to 5:00 PM Monday through Friday,  call [phone].  After hours and on weekends, please call the GI answering service at [phone] who will take a message and have the physician on call contact you.   Following lower endoscopy (colonoscopy or flexible sigmoidoscopy):  Excessive amounts of blood in the stool  Significant tenderness or worsening of abdominal pains  Swelling of the abdomen that is new, acute  Fever of 100F or higher  FOLLOW UP: If any biopsies were taken you will be contacted by phone or by letter within the next 1-3 weeks.  Call your gastroenterologist if you have not heard about the biopsies in 3 weeks.  Our staff will call the home number listed on your records the next business day following your procedure to check on you and address any questions or concerns that you may have at that time regarding the information given to you following your procedure. This is a courtesy call and so if there is no answer at the home number and we have not heard from you through the emergency physician on call, we will assume that you have returned to your regular daily activities without incident.  SIGNATURES/CONFIDENTIALITY: You and/or your care partner have signed paperwork which will be entered into your electronic medical record.  These signatures attest to the fact that that the information above on your After Visit Summary has been reviewed and is understood.  Full responsibility of the confidentiality of this   discharge information lies with you and/or your care-partner.  Recommendations Next colonoscopy in 10 years. 

## 2013-11-18 NOTE — Progress Notes (Signed)
Lidocaine-40mg IV prior to Propofol InductionPropofol given over incremental dosages 

## 2013-11-18 NOTE — Op Note (Signed)
[facility] [address] [place], [id number]   COLONOSCOPY PROCEDURE REPORT  PATIENT: Jamie Rowland, [name]  MR#: [id number] BIRTHDATE: 21-May-1961 , 52  yrs. old GENDER: Female ENDOSCOPIST: Louis Meckelobert D Moua Rasmusson, MD REFERRED XB:JYNWGBY:Talia Aron, M.D. PROCEDURE DATE:  11/18/2013 PROCEDURE:   Colonoscopy, diagnostic First Screening Colonoscopy - Avg.  risk and is 50 yrs.  old or older Yes.  Prior Negative Screening - Now for repeat screening. N/A  History of Adenoma - Now for follow-up colonoscopy & has been > or = to 3 yrs.  N/A  Polyps Removed Today? No.  Recommend repeat exam, <10 yrs? No. ASA CLASS:   Class II INDICATIONS:average risk screening. MEDICATIONS: propofol (Diprivan) 200mg  IV  DESCRIPTION OF PROCEDURE:   After the risks benefits and alternatives of the procedure were thoroughly explained, informed consent was obtained.  A digital rectal exam revealed no abnormalities of the rectum.   The LB [id number]  endoscope was introduced through the anus and advanced to the cecum, which was identified by both the appendix and ileocecal valve. No adverse events experienced.   The quality of the prep was excellent using Suprep  The instrument was then slowly withdrawn as the colon was fully examined.      COLON FINDINGS: Mild diverticulosis was noted in the sigmoid colon. Internal hemorrhoids were found.   The colon was otherwise normal. There was no diverticulosis, inflammation, polyps or cancers unless previously stated.  Retroflexed views revealed no abnormalities. The time to cecum=3 minutes 20 seconds.  Withdrawal time=8 minutes 06 seconds.  The scope was withdrawn and the procedure completed. COMPLICATIONS: There were no complications.  ENDOSCOPIC IMPRESSION: 1.   Mild diverticulosis was noted in the sigmoid colon 2.   Internal hemorrhoids 3.   The colon was otherwise normal  RECOMMENDATIONS: Continue current colorectal screening recommendations for  "routine risk" patients with a repeat colonoscopy in 10 years.   eSigned:  Louis Meckelobert D Kaelan Amble, MD 11/18/2013 9:39 AM   cc:   PATIENT NAME:  Jamie Rowland, Yazmyn C. MR#: [id number]

## 2013-11-19 ENCOUNTER — Telehealth: Payer: Self-pay | Admitting: *Deleted

## 2013-11-19 NOTE — Telephone Encounter (Signed)
  Follow up Call-  Call back number 11/18/2013  Post procedure Call Back phone  # [phone]  Permission to leave phone message Yes     Patient questions:  Do you have a fever, pain , or abdominal swelling? yes Pain Score  0 *  Have you tolerated food without any problems? yes  Have you been able to return to your normal activities? yes  Do you have any questions about your discharge instructions: Diet   no Medications  no Follow up visit  no  Do you have questions or concerns about your Care? no  Actions: * If pain score is 4 or above: No action needed, pain <4.  Patient c/o "lower back pain", she states that she has back problems anyway, but none lately until procedure. encouraged patient to call us back if worsen or changes. She understands.

## 2013-11-26 ENCOUNTER — Telehealth: Payer: Self-pay

## 2013-11-26 NOTE — Telephone Encounter (Signed)
Pt request copy of immunization record mailed to her verified home address.Done.

## 2013-12-23 ENCOUNTER — Ambulatory Visit (INDEPENDENT_AMBULATORY_CARE_PROVIDER_SITE_OTHER): Payer: BC Managed Care – PPO | Admitting: Internal Medicine

## 2013-12-23 ENCOUNTER — Encounter: Payer: Self-pay | Admitting: Internal Medicine

## 2013-12-23 VITALS — BP 118/74 | HR 89 | Temp 98.4°F | Wt 220.5 lb

## 2013-12-23 DIAGNOSIS — J45909 Unspecified asthma, uncomplicated: Secondary | ICD-10-CM

## 2013-12-23 DIAGNOSIS — J45998 Other asthma: Secondary | ICD-10-CM

## 2013-12-23 DIAGNOSIS — J45901 Unspecified asthma with (acute) exacerbation: Secondary | ICD-10-CM

## 2013-12-23 MED ORDER — PREDNISONE 10 MG PO TABS: ORAL_TABLET | ORAL | Status: DC

## 2013-12-23 NOTE — Progress Notes (Signed)
HPI  Pt presents to the clinic today with c/o cough and wheezing. She reports this started about 3-4 days ago. The cough is non productive. She denies fever, chills or body aches. She has not tried anything OTC. She does have a history of asthma and allergies. She is taking Advair, Singulair and Albuterol as prescribed.  Review of Systems      Past Medical History  Diagnosis Date  . Asthma   . Seasonal allergies   . Migraine headache   . Bronchitis     Family History  Problem Relation Age of Onset  . Hypertension Mother   . Diverticulitis Mother   . Fibromyalgia Mother   . Cancer Sister     uterine  . Diabetes Maternal Uncle   . Heart disease Maternal Uncle   . Asthma Maternal Grandmother   . Heart disease Maternal Grandmother   . Emphysema Maternal Grandmother   . Colon cancer Neg Hx   . Esophageal cancer Neg Hx   . Rectal cancer Neg Hx   . Stomach cancer Neg Hx     History   Social History  . Marital Status: Married    Spouse Name: N/A    Number of Children: 3  . Years of Education: N/A   Occupational History  . Not on file.   Social History Main Topics  . Smoking status: Never Smoker   . Smokeless tobacco: Never Used  . Alcohol Use: No  . Drug Use: No  . Sexual Activity: Yes   Other Topics Concern  . 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 Codeine Sulfate     REACTION: Hives, can't breathe  . Latex Hives     Constitutional:  Denies headache, fatigue, fever or abrupt weight changes.  HEENT:  Denies eye redness, eye pain, pressure behind the eyes, facial pain, nasal congestion, ear pain, ringing in the ears, wax buildup, runny nose or bloody nose. Respiratory: Positive cough. Denies difficulty breathing or shortness of breath.  Cardiovascular: Denies  chest pain, chest tightness, palpitations or swelling in the hands or feet.   No other specific complaints in a complete review of systems (except as listed in HPI above).  Objective:   BP 118/74  Pulse 89  Temp(Src) 98.4 F (36.9 C) (Oral)  Wt 220 lb 8 oz (100.018 kg)  SpO2 98% Wt Readings from Last 3 Encounters:  12/23/13 220 lb 8 oz (100.018 kg)  11/18/13 213 lb (96.616 kg)  11/11/13 213 lb (96.616 kg)     General: Appears her stated age, well developed, well nourished in NAD. HEENT: Head: normal shape and size; Eyes: sclera white, no icterus, conjunctiva pink, PERRLA and EOMs intact; Ears: Tm's gray and intact, nrormal light reflex; Nose: mucosa pink and moist, septum midline; Throat/Mouth: + PND. Teeth present, mucosa erythematous and moist, no exudate noted, no lesions or ulcerations noted.  Neck: Neck supple, trachea midline. No massses, lumps or thyromegaly present.  Cardiovascular: Normal rate and rhythm. S1,S2 noted.  No murmur, rubs or gallops noted. No JVD or BLE edema. No carotid bruits noted. Pulmonary/Chest: Normal effort and bilateral expiratory wheeze noted. No respiratory distress. No rales or ronchi noted.      Assessment & Plan:   Asthma Exacerbation:  eRx for pred taper  x 6 days Asthma does not seem well controlled Will refer to pulmonology for further evaluation and treatment  RTC as needed or if symptoms persist.

## 2013-12-23 NOTE — Patient Instructions (Addendum)
Asthma, Acute Bronchospasm °Acute bronchospasm caused by asthma is also referred to as an asthma attack. Bronchospasm means your air passages become narrowed. The narrowing is caused by inflammation and tightening of the muscles in the air tubes (bronchi) in your lungs. This can make it hard to breath or cause you to wheeze and cough. °CAUSES °Possible triggers are: °· Animal dander from the skin, hair, or feathers of animals. °· Dust mites contained in house dust. °· Cockroaches. °· Pollen from trees or grass. °· Mold. °· Cigarette or tobacco smoke. °· Air pollutants such as dust, household cleaners, hair sprays, aerosol sprays, paint fumes, strong chemicals, or strong odors. °· Cold air or weather changes. Cold air may trigger inflammation. Winds increase molds and pollens in the air. °· Strong emotions such as crying or laughing hard. °· Stress. °· Certain medicines such as aspirin or beta-blockers. °· Sulfites in foods and drinks, such as dried fruits and wine. °· Infections or inflammatory conditions, such as a flu, cold, or inflammation of the nasal membranes (rhinitis). °· Gastroesophageal reflux disease (GERD). GERD is a condition where stomach acid backs up into your throat (esophagus). °· Exercise or strenuous activity. °SIGNS AND SYMPTOMS  °· Wheezing. °· Excessive coughing, particularly at night. °· Chest tightness. °· Shortness of breath. °DIAGNOSIS  °Your health care provider will ask you about your medical history and perform a physical exam. A chest X-ray or blood testing may be performed to look for other causes of your symptoms or other conditions that may have triggered your asthma attack.  °TREATMENT  °Treatment is aimed at reducing inflammation and opening up the airways in your lungs.  Most asthma attacks are treated with inhaled medicines. These include quick relief or rescue medicines (such as bronchodilators) and controller medicines (such as inhaled corticosteroids). These medicines are  sometimes given through an inhaler or a nebulizer. Systemic steroid medicine taken by mouth or given through an IV tube also can be used to reduce the inflammation when an attack is moderate or severe. Antibiotic medicines are only used if a bacterial infection is present.  °HOME CARE INSTRUCTIONS  °· Rest. °· Drink plenty of liquids. This helps the mucus to remain thin and be easily coughed up. Only use caffeine in moderation and do not use alcohol until you have recovered from your illness. °· Do not smoke. Avoid being exposed to secondhand smoke. °· You play a critical role in keeping yourself in good health. Avoid exposure to things that cause you to wheeze or to have breathing problems. °· Keep your medicines up to date and available. Carefully follow your health care provider's treatment plan. °· Take your medicine exactly as prescribed. °· When pollen or pollution is bad, keep windows closed and use an air conditioner or go to places with air conditioning. °· Asthma requires careful medical care. See your health care provider for a follow-up as advised. If you are more than [redacted] weeks pregnant and you were prescribed any new medicines, let your obstetrician know about the visit and how you are doing. Follow-up with your health care provider as directed. °· After you have recovered from your asthma attack, make an appointment with your outpatient doctor to talk about ways to reduce the likelihood of future attacks. If you do not have a doctor who manages your asthma, make an appointment with a primary care doctor to discuss your asthma. °SEEK IMMEDIATE MEDICAL CARE IF:  °· You are getting worse. °· You have trouble breathing. If severe, call   your local emergency services (911 in the [place] °· You develop chest pain or discomfort. °· You are vomiting. °· You are not able to keep fluids down. °· You are coughing up yellow, green, brown, or bloody sputum. °· You have a fever and your symptoms suddenly get  worse. °· You have trouble swallowing. °MAKE SURE YOU:  °· Understand these instructions. °· Will watch your condition. °· Will get help right away if you are not doing well or get worse. °Document Released: 01/17/2007 Document Revised: 06/04/2013 Document Reviewed: 04/09/2013 °[employer]® Patient Information ©2014 [employer]. ° °

## 2013-12-23 NOTE — Progress Notes (Signed)
Pre visit review using our clinic review tool, if applicable. No additional management support is needed unless otherwise documented below in the visit note. 

## 2013-12-24 ENCOUNTER — Other Ambulatory Visit: Payer: Self-pay | Admitting: Family Medicine

## 2014-01-05 ENCOUNTER — Institutional Professional Consult (permissible substitution): Payer: BC Managed Care – PPO | Admitting: Internal Medicine

## 2014-01-15 ENCOUNTER — Institutional Professional Consult (permissible substitution): Payer: BC Managed Care – PPO | Admitting: Internal Medicine

## 2014-01-27 ENCOUNTER — Institutional Professional Consult (permissible substitution): Payer: BC Managed Care – PPO | Admitting: Internal Medicine

## 2014-02-05 ENCOUNTER — Ambulatory Visit (INDEPENDENT_AMBULATORY_CARE_PROVIDER_SITE_OTHER): Payer: BC Managed Care – PPO | Admitting: Internal Medicine

## 2014-02-05 ENCOUNTER — Encounter: Payer: Self-pay | Admitting: Internal Medicine

## 2014-02-05 VITALS — BP 118/80 | HR 94 | Temp 98.5°F | Wt 222.5 lb

## 2014-02-05 DIAGNOSIS — J45901 Unspecified asthma with (acute) exacerbation: Secondary | ICD-10-CM

## 2014-02-05 MED ORDER — PREDNISONE 10 MG PO TABS: ORAL_TABLET | ORAL | Status: DC

## 2014-02-05 NOTE — Progress Notes (Signed)
Pre visit review using our clinic review tool, if applicable. No additional management support is needed unless otherwise documented below in the visit note. 

## 2014-02-05 NOTE — Patient Instructions (Addendum)
Asthma, Acute Bronchospasm °Acute bronchospasm caused by asthma is also referred to as an asthma attack. Bronchospasm means your air passages become narrowed. The narrowing is caused by inflammation and tightening of the muscles in the air tubes (bronchi) in your lungs. This can make it hard to breath or cause you to wheeze and cough. °CAUSES °Possible triggers are: °· Animal dander from the skin, hair, or feathers of animals. °· Dust mites contained in house dust. °· Cockroaches. °· Pollen from trees or grass. °· Mold. °· Cigarette or tobacco smoke. °· Air pollutants such as dust, household cleaners, hair sprays, aerosol sprays, paint fumes, strong chemicals, or strong odors. °· Cold air or weather changes. Cold air may trigger inflammation. Winds increase molds and pollens in the air. °· Strong emotions such as crying or laughing hard. °· Stress. °· Certain medicines such as aspirin or beta-blockers. °· Sulfites in foods and drinks, such as dried fruits and wine. °· Infections or inflammatory conditions, such as a flu, cold, or inflammation of the nasal membranes (rhinitis). °· Gastroesophageal reflux disease (GERD). GERD is a condition where stomach acid backs up into your throat (esophagus). °· Exercise or strenuous activity. °SIGNS AND SYMPTOMS  °· Wheezing. °· Excessive coughing, particularly at night. °· Chest tightness. °· Shortness of breath. °DIAGNOSIS  °Your health care provider will ask you about your medical history and perform a physical exam. A chest X-ray or blood testing may be performed to look for other causes of your symptoms or other conditions that may have triggered your asthma attack.  °TREATMENT  °Treatment is aimed at reducing inflammation and opening up the airways in your lungs.  Most asthma attacks are treated with inhaled medicines. These include quick relief or rescue medicines (such as bronchodilators) and controller medicines (such as inhaled corticosteroids). These medicines are  sometimes given through an inhaler or a nebulizer. Systemic steroid medicine taken by mouth or given through an IV tube also can be used to reduce the inflammation when an attack is moderate or severe. Antibiotic medicines are only used if a bacterial infection is present.  °HOME CARE INSTRUCTIONS  °· Rest. °· Drink plenty of liquids. This helps the mucus to remain thin and be easily coughed up. Only use caffeine in moderation and do not use alcohol until you have recovered from your illness. °· Do not smoke. Avoid being exposed to secondhand smoke. °· You play a critical role in keeping yourself in good health. Avoid exposure to things that cause you to wheeze or to have breathing problems. °· Keep your medicines up to date and available. Carefully follow your health care provider's treatment plan. °· Take your medicine exactly as prescribed. °· When pollen or pollution is bad, keep windows closed and use an air conditioner or go to places with air conditioning. °· Asthma requires careful medical care. See your health care provider for a follow-up as advised. If you are more than [redacted] weeks pregnant and you were prescribed any new medicines, let your obstetrician know about the visit and how you are doing. Follow-up with your health care provider as directed. °· After you have recovered from your asthma attack, make an appointment with your outpatient doctor to talk about ways to reduce the likelihood of future attacks. If you do not have a doctor who manages your asthma, make an appointment with a primary care doctor to discuss your asthma. °SEEK IMMEDIATE MEDICAL CARE IF:  °· You are getting worse. °· You have trouble breathing. If severe, call   your local emergency services (911 in the [place] °· You develop chest pain or discomfort. °· You are vomiting. °· You are not able to keep fluids down. °· You are coughing up yellow, green, brown, or bloody sputum. °· You have a fever and your symptoms suddenly get  worse. °· You have trouble swallowing. °MAKE SURE YOU:  °· Understand these instructions. °· Will watch your condition. °· Will get help right away if you are not doing well or get worse. °Document Released: 01/17/2007 Document Revised: 06/04/2013 Document Reviewed: 04/09/2013 °[employer]® Patient Information ©2014 [employer]. ° °

## 2014-02-05 NOTE — Progress Notes (Signed)
Subjective:    Patient ID: Jamie Rowland, female    DOB: [name], 53 y.o.   MRN: [id number]  HPI  Pt presents to the clinic today with c/o shortness of breath, wheezing, and cough. She reports this started. The cough is dry and unproductive but she reports having "coughing spells". She has used her inhalers and nebulizers with only minimal relief. She has had multiple asthma exacerbations despite being on advair, atrovent, albuterol and singulair. She does not smoke. She was referred to pulmonology at her last visit but no showed 2 appts. She reports that she has a new pt appt May 22 with [facility] pulmonology.  Review of Systems      Past Medical History  Diagnosis Date  . Asthma   . Seasonal allergies   . Migraine headache   . Bronchitis     Current Outpatient Prescriptions  Medication Sig Dispense Refill  . albuterol (PROVENTIL) (2.5 MG/3ML) 0.083% nebulizer solution Take 2.5 mg by nebulization every 6 (six) hours as needed for wheezing or shortness of breath.      . Fluticasone-Salmeterol (ADVAIR) 250-50 MCG/DOSE AEPB Inhale 1 puff into the lungs 2 (two) times daily.  60 each  11  . montelukast (SINGULAIR) 10 MG tablet Take 1 tablet (10 mg total) by mouth at bedtime.  30 tablet  11  . oxybutynin (DITROPAN-XL) 10 MG 24 hr tablet Take 1 tablet (10 mg total) by mouth 2 (two) times daily.  60 tablet  5  . predniSONE (DELTASONE) 10 MG tablet Take 3 tabs on days 1-2, take 2 tabs on days 3-4, take 1 tab on days 5-6  12 tablet  0  . SUMAtriptan (IMITREX) 100 MG tablet Take one at onset of headache      . VENTOLIN HFA 108 (90 BASE) MCG/ACT inhaler INHALE 2 PUFFS INTO THE LUNGS EVERY 6 HOURS AS NEEDED  18 g  2  . albuterol (PROVENTIL) (2.5 MG/3ML) 0.083% nebulizer solution Take 3 mLs (2.5 mg total) by nebulization once.  150 mL  1   Current Facility-Administered Medications  Medication Dose Route Frequency Provider Last Rate Last Dose  . ipratropium (ATROVENT) nebulizer solution 0.5 mg   0.5 mg Nebulization Once Dianne Dunalia M Aron, MD        Allergies  Allergen Reactions  . Codeine Sulfate     REACTION: Hives, can't breathe  . Latex Hives    Family History  Problem Relation Age of Onset  . Hypertension Mother   . Diverticulitis Mother   . Fibromyalgia Mother   . Cancer Sister     uterine  . Diabetes Maternal Uncle   . Heart disease Maternal Uncle   . Asthma Maternal Grandmother   . Heart disease Maternal Grandmother   . Emphysema Maternal Grandmother   . Colon cancer Neg Hx   . Esophageal cancer Neg Hx   . Rectal cancer Neg Hx   . Stomach cancer Neg Hx     History   Social History  . Marital Status: Married    Spouse Name: N/A    Number of Children: 3  . Years of Education: N/A   Occupational History  . Not on file.   Social History Main Topics  . Smoking status: Never Smoker   . Smokeless tobacco: Never Used  . Alcohol Use: No  . Drug Use: No  . Sexual Activity: Yes   Other Topics Concern  . 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  HEENT: Denies eye pain, eye redness, ear pain, ringing in the ears, wax buildup, runny nose, nasal congestion, bloody nose, or sore throat. Respiratory: Pt reports cough, shortness of breath and wheezing. Denies difficulty breathing or sputum production.      No other specific complaints in a complete review of systems (except as listed in HPI above).  Objective:   Physical Exam  BP 118/80  Pulse 94  Temp(Src) 98.5 F (36.9 C) (Oral)  Wt 222 lb 8 oz (100.925 kg)  SpO2 95% Wt Readings from Last 3 Encounters:  02/05/14 222 lb 8 oz (100.925 kg)  12/23/13 220 lb 8 oz (100.018 kg)  11/18/13 213 lb (96.616 kg)    General: Appears her stated age, obese but well  developed, well nourished in NAD.  Cardiovascular: Normal rate and rhythm. S1,S2 noted.  No murmur, rubs or gallops noted. No JVD or BLE edema. No carotid bruits noted. Pulmonary/Chest: Normal effort and bilateral expiratory wheezing noted. No respiratory distress. No rales or ronchi noted.    BMET    Component Value Date/Time   NA 138 07/04/2013 0827   K 4.3 07/04/2013 0827   CL 107 07/04/2013 0827   CO2 27 07/04/2013 0827   GLUCOSE 86 07/04/2013 0827   BUN 20 07/04/2013 0827   CREATININE 0.9 07/04/2013 0827   CALCIUM 9.0 07/04/2013 0827   GFRNONAA 81.49 04/08/2010 0831   GFRAA 77 05/16/2007 0947    Lipid Panel     Component Value Date/Time   CHOL 163 07/04/2013 0827   TRIG 203.0* 07/04/2013 0827   HDL 36.80* 07/04/2013 0827   CHOLHDL 4 07/04/2013 0827   VLDL 40.6* 07/04/2013 0827   LDLCALC 118* 06/27/2012 0836    CBC    Component Value Date/Time   WBC 9.6 07/04/2013 0827   RBC 4.61 07/04/2013 0827   HGB 14.5 07/04/2013 0827   HCT 42.2 07/04/2013 0827   PLT 251.0 07/04/2013 0827   MCV 91.5 07/04/2013 0827   MCHC 34.3 07/04/2013 0827   RDW 12.8 07/04/2013 0827   LYMPHSABS 3.3 07/04/2013 0827   MONOABS 1.0 07/04/2013 0827   EOSABS 0.5 07/04/2013 0827   BASOSABS 0.1 07/04/2013 0827    Hgb A1C No results found for this basename: HGBA1C         Assessment & Plan:   Asthma exacerbation:  eRx for pred taper Continue inhalers as prescribed Pleas show up for your pulmonology appt May 22  RTC as needed or if symptoms persist or worsen

## 2014-02-10 ENCOUNTER — Telehealth: Payer: Self-pay | Admitting: Family Medicine

## 2014-02-10 NOTE — Telephone Encounter (Signed)
Left detailed msg on VM per HIPAA  

## 2014-02-10 NOTE — Telephone Encounter (Signed)
Delsym or Nyquil for cough. And aleve or ibuprofen is bet for intercostal pain

## 2014-02-10 NOTE — Telephone Encounter (Signed)
Pt has completed prednisone today.  She has been coughing a lot and feels she has bruised her rib cage in the process and is taking Aleve for that, which doesn't help.  Her cough is productive and clear.  She wants to know if you can recommend something for her cough and for the rib cage pain.  She has apptmt w/the Pulmonary dr on Monday, 02/16/2014.  Please advise. Thank you.

## 2014-02-16 ENCOUNTER — Encounter: Payer: Self-pay | Admitting: Internal Medicine

## 2014-02-16 ENCOUNTER — Ambulatory Visit (INDEPENDENT_AMBULATORY_CARE_PROVIDER_SITE_OTHER): Payer: BC Managed Care – PPO | Admitting: Internal Medicine

## 2014-02-16 VITALS — BP 104/70 | HR 93 | Temp 99.1°F | Ht 65.5 in | Wt 227.8 lb

## 2014-02-16 DIAGNOSIS — J45909 Unspecified asthma, uncomplicated: Secondary | ICD-10-CM

## 2014-02-16 MED ORDER — PANTOPRAZOLE SODIUM 40 MG PO TBEC: 40.0000 mg | DELAYED_RELEASE_TABLET | Freq: Every day | ORAL | Status: DC

## 2014-02-16 MED ORDER — FAMOTIDINE 20 MG PO TABS: ORAL_TABLET | ORAL | Status: DC

## 2014-02-16 MED ORDER — MOMETASONE FURO-FORMOTEROL FUM 100-5 MCG/ACT IN AERO: INHALATION_SPRAY | RESPIRATORY_TRACT | Status: DC

## 2014-02-16 NOTE — Patient Instructions (Addendum)
Plan A = automatic = singulair and add dulera 100 Take 2 puffs first thing in am and then another 2 puffs about 12 hours later - work on smooth deep breath and out through the nose  Plan B = back up  Only use your albuterol (ventolin) as a rescue medication to be used if you can't catch your breath by resting or doing a relaxed purse lip breathing pattern.  - The less you use it, the better it will work when you need it. - Ok to use up to 2 puffs  every 4 hours if you must but call for immediate appointment if use goes up over your usual need - Don't leave home without it !!  (think of it like the spare tire for your car)   Plan C = nebulizer  Up to 4 hours if plan B doesn't work   Pantoprazole (protonix) 40 mg   Take 30-60 min before first meal of the day and Pepcid 20 mg one bedtime until return to office - this is the best way to tell whether stomach acid is contributing to your problem.    GERD (REFLUX)  is an extremely common cause of respiratory symptoms, many times with no significant heartburn at all.    It can be treated with medication, but also with lifestyle changes including avoidance of late meals, excessive alcohol, smoking cessation, and avoid fatty foods, chocolate, peppermint, colas, red wine, and acidic juices such as orange juice.  NO MINT OR MENTHOL PRODUCTS SO NO COUGH DROPS  USE SUGARLESS CANDY INSTEAD (jolley ranchers or Stover's)  NO OIL BASED VITAMINS - use powdered substitutes.  Please schedule a follow up office visit in 4 weeks, sooner if needed.

## 2014-02-16 NOTE — Progress Notes (Signed)
   Subjective:    Patient ID: Jamie Rowland, female    DOB: 07/16/1961  MRN: [id number]  HPI  [age] yowf never smoker asthma onset  5th or 6th grade including at least one admit but outgrew in HS but then recurrent symptoms a birth of 1st child in [id number] and took allergy shots x 2 years and stopped due to cost and lack of benefit and stayed on rescue thereafter and started advair 2013  singulair 2014 but referred 02/16/2014 to pulmonary clinic by Dr Renae Fickleegina Baty  with dtc asthma x 12/2013.  02/16/2014 1st [name]/ Jamie Rowland maint on advair/ singulair Chief Complaint  Patient presents with  . Pulmonary Consult    Referred per Jamie Reaperegina Baity, NP.  Pt states dxed with asthma as a child. She c/o increased SOB and cough x 2 months.  She states that she has "constant SOB, all the time".  Her cough is prod with minimal light green sputum. She is using rescue inhaler 3-4 times per day on average and using neb once at night.   "nothing works but neb" = duoneb Lots of noct cough/ first thing in am   No obvious other patterns in day to day or daytime variabilty or assoc   cp or chest tightness, subjective wheeze overt sinus or hb symptoms. No unusual exp hx or h/o childhood pna/ asthma or knowledge of premature birth.    Also denies any obvious fluctuation of symptoms with weather or environmental changes or other aggravating or alleviating factors except as outlined above   Current Medications, Allergies, Complete Past Medical History, Past Surgical History, Family History, and Social History were reviewed in [facility] electronic medical record.              Review of Systems  Constitutional: Negative for fever, chills and unexpected weight change.  HENT: Negative for congestion, dental problem, ear pain, nosebleeds, postnasal drip, rhinorrhea, sinus pressure, sneezing, sore throat, trouble swallowing and voice change.   Eyes: Negative for visual disturbance.  Respiratory: Positive  for cough and shortness of breath. Negative for choking.   Cardiovascular: Negative for chest pain and leg swelling.  Gastrointestinal: Negative for vomiting, abdominal pain and diarrhea.  Genitourinary: Negative for difficulty urinating.  Musculoskeletal: Negative for arthralgias.  Skin: Negative for rash.  Neurological: Positive for headaches. Negative for tremors and syncope.  Hematological: Does not bruise/bleed easily.       Objective:   Physical Exam  amb wf took saba w/in 3 h of ov Wt Readings from Last 3 Encounters:  02/16/14 227 lb 12.8 oz (103.329 kg)  02/05/14 222 lb 8 oz (100.925 kg)  12/23/13 220 lb 8 oz (100.018 kg)     HEENT: nl dentition, turbinates, and orophanx. Nl external ear canals without cough reflex   NECK :  without JVD/Nodes/TM/ nl carotid upstrokes bilaterally   LUNGS: no acc muscle use, clear to A and P bilaterally without cough on insp or exp maneuvers   CV:  RRR  no s3 or murmur or increase in P2, no edema   ABD:  soft and nontender with nl excursion in the supine position. No bruits or organomegaly, bowel sounds nl  MS:  warm without deformities, calf tenderness, cyanosis or clubbing  SKIN: warm and dry without lesions    NEURO:  alert, approp, no deficits    No recent cxr on file       Assessment & Plan:

## 2014-02-17 DIAGNOSIS — J45909 Unspecified asthma, uncomplicated: Secondary | ICD-10-CM | POA: Insufficient documentation

## 2014-02-17 NOTE — Assessment & Plan Note (Signed)
DDX of  difficult airways managment all start with A and  include Adherence, Ace Inhibitors, Acid Reflux, Active Sinus Disease, Alpha 1 Antitripsin deficiency, Anxiety masquerading as Airways dz,  ABPA,  allergy(esp in young), Aspiration (esp in elderly), Adverse effects of DPI,  Active smokers, plus two Bs  = Bronchiectasis and Beta blocker use..and one C= CHF  Adherence is always the initial "prime suspect" and is a multilayered concern that requires a "trust but verify" approach in every patient - starting with knowing how to use medications, especially inhalers, correctly, keeping up with refills and understanding the fundamental difference between maintenance and prns vs those medications only taken for a very short course and then stopped and not refilled.  The proper method of use, as well as anticipated side effects, of a metered-dose inhaler are discussed and demonstrated to the patient. Improved effectiveness after extensive coaching during this visit to a level of approximately  75% so try dulera 100 2bid  ? Adverse effects of advair dpi > try off  ? Acid (or non-acid) GERD > always difficult to exclude as up to 75% of pts in some series report no assoc GI/ Heartburn symptoms> rec max (24h)  acid suppression and diet restrictions/ reviewed and instructions given in writing.   ? Allergy > continue singulair for now, allergy testing later  See instructions for specific recommendations which were reviewed directly with the patient who was given a copy with highlighter outlining the key components.

## 2014-02-20 ENCOUNTER — Institutional Professional Consult (permissible substitution): Payer: BC Managed Care – PPO | Admitting: Critical Care Medicine

## 2014-03-16 ENCOUNTER — Ambulatory Visit: Payer: BC Managed Care – PPO | Admitting: Internal Medicine

## 2014-03-27 ENCOUNTER — Encounter (INDEPENDENT_AMBULATORY_CARE_PROVIDER_SITE_OTHER): Payer: Self-pay

## 2014-03-27 ENCOUNTER — Encounter: Payer: Self-pay | Admitting: Internal Medicine

## 2014-03-27 ENCOUNTER — Ambulatory Visit (INDEPENDENT_AMBULATORY_CARE_PROVIDER_SITE_OTHER): Payer: BC Managed Care – PPO | Admitting: Internal Medicine

## 2014-03-27 VITALS — BP 124/80 | HR 82 | Temp 98.4°F | Ht 65.5 in | Wt 225.8 lb

## 2014-03-27 DIAGNOSIS — J45909 Unspecified asthma, uncomplicated: Secondary | ICD-10-CM

## 2014-03-27 MED ORDER — BUDESONIDE-FORMOTEROL FUMARATE 80-4.5 MCG/ACT IN AERO: INHALATION_SPRAY | RESPIRATORY_TRACT | Status: DC

## 2014-03-27 NOTE — Patient Instructions (Addendum)
Symbicort Take 2 puffs first thing in am and then another 2 puffs about 12 hours later.   Only use your albuterol as a rescue medication to be used if you can't catch your breath by resting or doing a relaxed purse lip breathing pattern.  - The less you use it, the better it will work when you need it. - Ok to use up to 2 puffs  every 4 hours if you must but call for immediate appointment if use goes up over your usual need - Don't leave home without it !!  (think of it like the spare tire for your car)   Work on inhaler technique:  relax and gently blow all the way out then take a nice smooth deep breath back in, triggering the inhaler at same time you start breathing in.  Hold for up to 5 seconds if you can.  Rinse and gargle with water when done  Please schedule a follow up office visit in 6 weeks, call sooner if needed   With your drug formulary in had

## 2014-03-27 NOTE — Progress Notes (Signed)
Subjective:    Patient ID: Jamie Rowland, female    DOB: 01/13/1961  MRN: [id number]   Brief patient profile:  52 yowf never smoker asthma onset  5th or 6th grade including at least one admit but outgrew in HS but then recurrent symptoms a birth of 1st child in [id number] and took allergy shots x 2 years and stopped due to cost and lack of benefit and stayed on rescue thereafter and started advair 2013  singulair 2014 but referred 02/16/2014 to pulmonary clinic by Dr Jamie Rowland  with dtc asthma x 12/2013.     History of Present Illness  02/16/2014 1st [facility] office visit/ Jamie Rowland maint on advair/ singulair Chief Complaint  Patient presents with  . Pulmonary Consult    Referred per Jamie Reaperegina Baity, NP.  Pt states dxed with asthma as a child. She c/o increased SOB and cough x 2 months.  She states that she has "constant SOB, all the time".  Her cough is prod with minimal light green sputum. She is using rescue inhaler 3-4 times per day on average and using neb once at night.   "nothing works but neb" = duoneb Lots of noct cough/ first thing in am  rec Plan A = automatic = singulair and add dulera 100 Take 2 puffs first thing in am and then another 2 puffs about 12 hours later - work on smooth deep breath and out through the nose Plan B = back up  Only use your albuterol (ventolin) as a rescue medication to be used if you can't catch your breath by resting or doing a relaxed purse lip breathing pattern.  - The less you use it, the better it will work when you need it. - Ok to use up to 2 puffs  every 4 hours if you must but call for immediate appointment if use goes up over your usual need - Don't leave home without it !!  (think of it like the spare tire for your car)  Plan C = nebulizer  Up to 4 hours if plan B doesn't work Pantoprazole (protonix) 40 mg   Take 30-60 min before first meal of the day and Pepcid 20 mg one bedtime until return to office - this is the best way to tell whether  stomach acid is contributing to your problem.   GERD diet   03/27/2014 f/u ov/Jamie Rowland re: asthma/ better while on dulera 100 but insurance wouldn't pay Chief Complaint  Patient presents with  . Follow-up    Pt states that her breathing had improved back to baseline, but then started noticing slight increased in SOB for approx 1 wk- relates to allergies. Cough has resolved. She has noticed wheezing after she goes outside.  She is using rescue inhaler 2-3 times per day, but has not needed neb.     Since stopped dulera breathing definitely worse and increase need f or saba    No obvious day to day or daytime variabilty(except worse in hot/humid conditions) or assoc  cough or cp or chest tightness, subjective wheeze overt sinus or hb symptoms. No unusual exp hx or h/o childhood pna/ asthma or knowledge of premature birth.  Sleeping ok without nocturnal  or early am exacerbation  of respiratory  c/o's or need for noct saba. Also denies any obvious fluctuation of symptoms with weather or environmental changes or other aggravating or alleviating factors except as outlined above   Current Medications, Allergies, Complete Past Medical History, Past Surgical History,  Family History, and Social History were reviewed in [facility] electronic medical record.  ROS  The following are not active complaints unless bolded sore throat, dysphagia, dental problems, itching, sneezing,  nasal congestion or excess/ purulent secretions, ear ache,   fever, chills, sweats, unintended wt loss, pleuritic or exertional cp, hemoptysis,  orthopnea pnd or leg swelling, presyncope, palpitations, heartburn, abdominal pain, anorexia, nausea, vomiting, diarrhea  or change in bowel or urinary habits, change in stools or urine, dysuria,hematuria,  rash, arthralgias, visual complaints, headache, numbness weakness or ataxia or problems with walking or coordination,  change in mood/affect or memory.                           Objective:   Physical Exam  amb wf   03/27/2014   226  Wt Readings from Last 3 Encounters:  02/16/14 227 lb 12.8 oz (103.329 kg)  02/05/14 222 lb 8 oz (100.925 kg)  12/23/13 220 lb 8 oz (100.018 kg)     HEENT: nl dentition, turbinates, and orophanx. Nl external ear canals without cough reflex   NECK :  without JVD/Nodes/TM/ nl carotid upstrokes bilaterally   LUNGS: no acc muscle use, clear to A and P bilaterally without cough on insp or exp maneuvers   CV:  RRR  no s3 or murmur or increase in P2, no edema   ABD:  soft and nontender with nl excursion in the supine position. No bruits or organomegaly, bowel sounds nl  MS:  warm without deformities, calf tenderness, cyanosis or clubbing  SKIN: warm and dry without lesions    NEURO:  alert, approp, no deficits    No recent cxr on file       Assessment & Plan:

## 2014-03-28 NOTE — Assessment & Plan Note (Addendum)
-   hfa 75% p coaching 02/16/14 > trial of dulera 100 2bid > improved but not on insurance formulary > changed to symbicort 80 2bid  DDX of  difficult airways management all start with A and  include Adherence, Ace Inhibitors, Acid Reflux, Active Sinus Disease, Alpha 1 Antitripsin deficiency, Anxiety masquerading as Airways dz,  ABPA,  allergy(esp in young), Aspiration (esp in elderly), Adverse effects of DPI,  Active smokers, plus two Bs  = Bronchiectasis and Beta blocker use..and one C= CHF  Adherence is always the initial "prime suspect" and is a multilayered concern that requires a "trust but verify" approach in every patient - starting with knowing how to use medications, especially inhalers, correctly, keeping up with refills and understanding the fundamental difference between maintenance and prns vs those medications only taken for a very short course and then stopped and not refilled.  In this case Adherence is the biggest issue and starts with  inability to use HFA effectively and also  understand that SABA treats the symptoms but doesn't get to the underlying problem (inflammation).  I used  the analogy of putting steroid cream on a rash to help explain the meaning of topical therapy and the need to get the drug to the target tissue > should not be relying on saba for rx and not clear why her insurance won't cover her meds but did not bring formulary with her.   The proper method of use, as well as anticipated side effects, of a metered-dose inhaler are discussed and demonstrated to the patient. Improved effectiveness after extensive coaching during this visit to a level of approximately  75% no better so needs to keep working on it  ? Allergy > singulair continue for now though not really clear if needs this if could stay on ICS long enough to get full benefit.   ? Acid (or non-acid) GERD > always difficult to exclude as up to 75% of pts in some series report no assoc GI/ Heartburn symptoms> rec  continue max (24h)  acid suppression and diet restrictions     Each maintenance medication was reviewed in detail including most importantly the difference between maintenance and as needed and under what circumstances the prns are to be used.  Please see instructions for details which were reviewed in writing and the patient given a copy.

## 2014-04-21 ENCOUNTER — Encounter (HOSPITAL_COMMUNITY): Payer: Self-pay | Admitting: Emergency Medicine

## 2014-04-21 ENCOUNTER — Emergency Department (HOSPITAL_COMMUNITY)
Admission: EM | Admit: 2014-04-21 | Discharge: 2014-04-21 | Disposition: A | Discharge status: Home / Self Care | Payer: BC Managed Care – PPO | Attending: Emergency Medicine | Admitting: Emergency Medicine

## 2014-04-21 DIAGNOSIS — Z79899 Other long term (current) drug therapy: Secondary | ICD-10-CM | POA: Insufficient documentation

## 2014-04-21 DIAGNOSIS — M5416 Radiculopathy, lumbar region: Secondary | ICD-10-CM

## 2014-04-21 DIAGNOSIS — IMO0002 Reserved for concepts with insufficient information to code with codable children: Secondary | ICD-10-CM | POA: Insufficient documentation

## 2014-04-21 DIAGNOSIS — J45909 Unspecified asthma, uncomplicated: Secondary | ICD-10-CM | POA: Insufficient documentation

## 2014-04-21 DIAGNOSIS — Z91040 Latex allergy status: Secondary | ICD-10-CM | POA: Insufficient documentation

## 2014-04-21 DIAGNOSIS — G43909 Migraine, unspecified, not intractable, without status migrainosus: Secondary | ICD-10-CM | POA: Insufficient documentation

## 2014-04-21 MED ORDER — HYDROCODONE-ACETAMINOPHEN 5-325 MG PO TABS: ORAL_TABLET | ORAL | Status: DC

## 2014-04-21 MED ORDER — PREDNISONE 20 MG PO TABS: ORAL_TABLET | ORAL | Status: DC

## 2014-04-21 MED ORDER — PREDNISONE 20 MG PO TABS
60.0000 mg | ORAL_TABLET | Freq: Once | ORAL | Status: AC
  Administered 2014-04-21: 60 mg via ORAL
  Filled 2014-04-21: qty 3

## 2014-04-21 MED ORDER — ACETAMINOPHEN 325 MG PO TABS
650.0000 mg | ORAL_TABLET | Freq: Once | ORAL | Status: AC
  Administered 2014-04-21: 650 mg via ORAL
  Filled 2014-04-21: qty 2

## 2014-04-21 NOTE — ED Notes (Signed)
Declined W/C at D/C and was escorted to lobby by RN. 

## 2014-04-21 NOTE — ED Provider Notes (Signed)
Medical screening examination/treatment/procedure(s) were performed by non-physician practitioner and as supervising physician I was immediately available for consultation/collaboration.   EKG Interpretation None        Glynn OctaveStephen Salah Nakamura, MD 04/21/14 2107

## 2014-04-21 NOTE — ED Provider Notes (Signed)
CSN: [id number]     Arrival date & time 04/21/14  1522 History  This chart was scribed for non-physician practitioner, Wynetta EmeryNicole Karizma Cheek, PA-C working with Glynn OctaveStephen Rancour, MD by Greggory StallionKayla Andersen, ED scribe. This patient was seen in room TR05C/TR05C and the patient's care was started at 4:15 PM.   Chief Complaint  Patient presents with  . Hip Pain   The history is provided by the patient. No language interpreter was used.   HPI Comments: Jamie Rowland A Morning is a 53 y.o. female who presents to the Emergency Department complaining of sudden onset, constant right hip pain that radiates down her right leg to her mid calf that started 2 days ago. States pain started when she went to stand up at school. She states she has had similar pain in the past; the last time being about 12 years ago. Bearing weight and movement worsens the pain. Pt has taken ibuprofen with no relief. Denies fever, bowel or bladder incontinence, back pain. Denies history of cancer or IV drug use. Pt has history of right knee arthroscopy and can not remember the name of her orthopedist.   Past Medical History  Diagnosis Date  . Asthma   . Seasonal allergies   . Migraine headache   . Bronchitis    Past Surgical History  Procedure Laterality Date  . Knee arthroscopy Right 2013  . Laparoscopic cholecystectomy  1991  . Tubal ligation  2002  . Cesarean section  [id number], 2002   Family History  Problem Relation Age of Onset  . Hypertension Mother   . Diverticulitis Mother   . Fibromyalgia Mother   . Cancer Sister     uterine  . Diabetes Maternal Uncle   . Heart disease Maternal Uncle   . Asthma Maternal Grandmother   . Heart disease Maternal Grandmother   . Emphysema Maternal Grandmother   . Colon cancer Neg Hx   . Esophageal cancer Neg Hx   . Rectal cancer Neg Hx   . Stomach cancer Neg Hx   . Emphysema Maternal Grandmother     smoked  . Heart failure Mother    History  Substance Use Topics  . Smoking status: Never  Smoker   . Smokeless tobacco: Never Used  . Alcohol Use: No   OB History   Grav Para Term Preterm Abortions TAB SAB Ect Mult Living                 Review of Systems  Constitutional: Negative for fever.  Genitourinary:       Negative for bowel or bladder incontinence.  Musculoskeletal: Positive for arthralgias and myalgias. Negative for back pain.  All other systems reviewed and are negative.  Allergies  Codeine sulfate and Latex  Home Medications   Prior to Admission medications   Medication Sig Start Date End Date Taking? Authorizing Provider  albuterol (PROVENTIL) (2.5 MG/3ML) 0.083% nebulizer solution Take 3 mLs (2.5 mg total) by nebulization once. 12/18/11 01/24/13  Dianne Dunalia M Aron, MD  albuterol (PROVENTIL) (2.5 MG/3ML) 0.083% nebulizer solution Take 2.5 mg by nebulization every 6 (six) hours as needed for wheezing or shortness of breath.    Historical Provider, MD  budesonide-formoterol (SYMBICORT) 80-4.5 MCG/ACT inhaler Take 2 puffs first thing in am and then another 2 puffs about 12 hours later. 03/27/14   Nyoka CowdenMichael B Wert, MD  famotidine (PEPCID) 20 MG tablet One at bedtime 02/16/14   Nyoka CowdenMichael B Wert, MD  fexofenadine (ALLEGRA) 180 MG tablet Take 180 mg by mouth  daily.    Historical Provider, MD  HYDROcodone-acetaminophen (NORCO/VICODIN) 5-325 MG per tablet Take 1-2 tablets by mouth every 6 hours as needed for pain. 04/21/14   Kaesen Rodriguez, PA-C  montelukast (SINGULAIR) 10 MG tablet Take 1 tablet (10 mg total) by mouth at bedtime. 08/04/13 08/04/14  Dianne Dun, MD  oxybutynin (DITROPAN-XL) 10 MG 24 hr tablet Take 1 tablet (10 mg total) by mouth 2 (two) times daily. 09/15/13   Dianne Dun, MD  pantoprazole (PROTONIX) 40 MG tablet Take 1 tablet (40 mg total) by mouth daily. Take 30-60 min before first meal of the day 02/16/14   Nyoka Cowden, MD  predniSONE (DELTASONE) 20 MG tablet 3 tabs po daily x 3 days, then 2 tabs x 3 days, then 1.5 tabs x 3 days, then 1 tab x 3 days, then 0.5  tabs x 3 days 04/21/14   Joni Reining Keyshon Stein, PA-C  SUMAtriptan (IMITREX) 100 MG tablet Take one at onset of headache    Historical Provider, MD  VENTOLIN HFA 108 (90 BASE) MCG/ACT inhaler INHALE 2 PUFFS INTO THE LUNGS EVERY 6 HOURS AS NEEDED    Dianne Dun, MD   BP 132/82  Pulse 102  Temp(Src) 98.8 F (37.1 C) (Oral)  Resp 18  SpO2 97%  Physical Exam  Nursing note and vitals reviewed. Constitutional: She is oriented to person, place, and time. She appears well-developed and well-nourished. No distress.  HENT:  Head: Normocephalic and atraumatic.  Mouth/Throat: Oropharynx is clear and moist.  Eyes: Conjunctivae and EOM are normal. Pupils are equal, round, and reactive to light.  Neck: Normal range of motion.  Cardiovascular: Normal rate, regular rhythm and intact distal pulses.   Pulmonary/Chest: Effort normal. No stridor.  Abdominal: Soft.  Musculoskeletal: Normal range of motion.  No point tenderness to percussion of lumbar spinal processes.  No TTP or paraspinal muscular spasm.   Straight leg raise positive on ipsilateral (left) at 45 degrees. And positive on contralateral side at 35 degrees. Neuro intact. Extensor hallucis longus strength 5/5.    Neurological: She is alert and oriented to person, place, and time.  Psychiatric: She has a normal mood and affect.    ED Course  Procedures (including critical care time)  DIAGNOSTIC STUDIES: Oxygen Saturation is 97% on RA, normal by my interpretation.    COORDINATION OF CARE: 4:21 PM-Discussed treatment plan which includes Vicodin and prednisone with pt at bedside and pt agreed to plan. Will give pt an orthopedic referral and advised her to follow up if symptoms do not resolve.   Labs Review Labs Reviewed - No data to display  Imaging Review No results found.   EKG Interpretation None      MDM   Final diagnoses:  Lumbar radicular pain    Filed Vitals:   04/21/14 1534  BP: 132/82  Pulse: 102  Temp: 98.8 F (37.1  C)  TempSrc: Oral  Resp: 18  SpO2: 97%    Medications  predniSONE (DELTASONE) tablet 60 mg (not administered)  acetaminophen (TYLENOL) tablet 650 mg (not administered)    Jamie Rowland is a 53 y.o. female presenting with  back pain.  No neurological deficits and normal neuro exam.  Patient can walk but states is painful.  No loss of bowel or bladder control.  No concern for cauda equina.  No fever, night sweats, weight loss, h/o cancer, IVDU.  RICE protocol and pain medicine indicated and discussed with patient.  Evaluation does not show pathology that  would require ongoing emergent intervention or inpatient treatment. Pt is hemodynamically stable and mentating appropriately. Discussed findings and plan with patient/guardian, who agrees with care plan. All questions answered. Return precautions discussed and outpatient follow up given.   New Prescriptions   HYDROCODONE-ACETAMINOPHEN (NORCO/VICODIN) 5-325 MG PER TABLET    Take 1-2 tablets by mouth every 6 hours as needed for pain.   PREDNISONE (DELTASONE) 20 MG TABLET    3 tabs po daily x 3 days, then 2 tabs x 3 days, then 1.5 tabs x 3 days, then 1 tab x 3 days, then 0.5 tabs x 3 days       I personally performed the services described in this documentation, which was scribed in my presence. The recorded information has been reviewed and is accurate.  Wynetta Emeryicole Melah Ebling, PA-C 04/21/14 1646

## 2014-04-21 NOTE — ED Notes (Signed)
Pt presents to department for evaluation of R hip pain radiating down R leg. Ongoing since Sunday. 5/10 pain upon arrival, becomes worse with weight bearing and movement. Pt is alert and oriented x4. NAD.

## 2014-04-21 NOTE — Discharge Instructions (Signed)
Take vicodin for breakthrough pain, do not drink alcohol, drive, care for children or do other critical tasks while taking vicodin.  Please follow with your primary care doctor in the next 2 days for a check-up. They must obtain records for further management.   Do not hesitate to return to the Emergency Department for any new, worsening or concerning symptoms.   Lumbosacral Radiculopathy Lumbosacral radiculopathy is a pinched nerve or nerves in the low back (lumbosacral area). When this happens you may have weakness in your legs and may not be able to stand on your toes. You may have pain going down into your legs. There may be difficulties with walking normally. There are many causes of this problem. Sometimes this may happen from an injury, or simply from arthritis or boney problems. It may also be caused by other illnesses such as diabetes. If there is no improvement after treatment, further studies may be done to find the exact cause. DIAGNOSIS  X-rays may be needed if the problems become long standing. Electromyograms may be done. This study is one in which the working of nerves and muscles is studied. HOME CARE INSTRUCTIONS   Applications of ice packs may be helpful. Ice can be used in a plastic bag with a towel around it to prevent frostbite to skin. This may be used every 2 hours for 20 to 30 minutes, or as needed, while awake, or as directed by your caregiver.  Only take over-the-counter or prescription medicines for pain, discomfort, or fever as directed by your caregiver.  If physical therapy was prescribed, follow your caregiver's directions. SEEK IMMEDIATE MEDICAL CARE IF:   You have pain not controlled with medications.  You seem to be getting worse rather than better.  You develop increasing weakness in your legs.  You develop loss of bowel or bladder control.  You have difficulty with walking or balance, or develop clumsiness in the use of your legs.  You have a  fever. MAKE SURE YOU:   Understand these instructions.  Will watch your condition.  Will get help right away if you are not doing well or get worse. Document Released: 10/02/2005 Document Revised: 12/25/2011 Document Reviewed: 05/22/2008 [facility] Patient Information 2015 [employer] [place]. This information is not intended to replace advice given to you by your health care provider. Make sure you discuss any questions you have with your health care provider.

## 2014-04-24 ENCOUNTER — Telehealth: Payer: Self-pay | Admitting: Family Medicine

## 2014-04-24 NOTE — Telephone Encounter (Signed)
Not sure how to handle this since there is no phone note.  Will route to [place] but I am ok with providing any note she needs for insurance.

## 2014-04-24 NOTE — Telephone Encounter (Signed)
Patient called on Tuesday,04/21/14.  She was having extreme pain in her lower back and the pain was shooting down her leg when she moved.  Patient called and spoke to call a nurse.  We didn't have any openings and they told her to go to the ER.  [facility]  There is no documentation in patient's chart showing that she was told to go to the ER.  Patient needs documentation that we told her to go the the ER for her insurance in order for them to pay for the visit.  Can you type a note stating that we didn't have any openings and patient was told to go to the ER?

## 2014-04-28 ENCOUNTER — Encounter: Payer: Self-pay | Admitting: Family Medicine

## 2014-05-08 ENCOUNTER — Ambulatory Visit: Payer: BC Managed Care – PPO | Admitting: Internal Medicine

## 2014-05-13 ENCOUNTER — Other Ambulatory Visit: Payer: Self-pay | Admitting: Family Medicine

## 2014-05-14 ENCOUNTER — Other Ambulatory Visit: Payer: Self-pay | Admitting: *Deleted

## 2014-05-14 MED ORDER — OXYBUTYNIN CHLORIDE ER 10 MG PO TB24: 10.0000 mg | ORAL_TABLET | Freq: Two times a day (BID) | ORAL | Status: DC

## 2014-05-14 NOTE — Telephone Encounter (Signed)
Pt left note with Triage saying she needs a refill of Rx, refilled x2 months until she has her CPE with [name], NP in Sept.

## 2014-05-14 NOTE — Telephone Encounter (Signed)
I have not prescribed this. Dr Dayton MartesAron did 12/14.

## 2014-05-14 NOTE — Telephone Encounter (Signed)
Pt requesting medication refill. Has not seen Dr Dayton MartesAron since 2013

## 2014-06-24 ENCOUNTER — Other Ambulatory Visit: Payer: Self-pay | Admitting: Internal Medicine

## 2014-07-13 ENCOUNTER — Other Ambulatory Visit (INDEPENDENT_AMBULATORY_CARE_PROVIDER_SITE_OTHER): Payer: BC Managed Care – PPO

## 2014-07-13 ENCOUNTER — Other Ambulatory Visit: Payer: Self-pay | Admitting: Internal Medicine

## 2014-07-13 ENCOUNTER — Encounter: Payer: BC Managed Care – PPO | Admitting: Internal Medicine

## 2014-07-13 DIAGNOSIS — Z0000 Encounter for general adult medical examination without abnormal findings: Secondary | ICD-10-CM

## 2014-07-13 LAB — CBC
HEMATOCRIT: 39.4 % (ref 36.0–46.0)
Hemoglobin: 13.3 g/dL (ref 12.0–15.0)
MCHC: 33.8 g/dL (ref 30.0–36.0)
MCV: 94 fl (ref 78.0–100.0)
Platelets: 234 10*3/uL (ref 150.0–400.0)
RBC: 4.2 Mil/uL (ref 3.87–5.11)
RDW: 12.9 % (ref 11.5–15.5)
WBC: 10.1 10*3/uL (ref 4.0–10.5)

## 2014-07-13 LAB — COMPREHENSIVE METABOLIC PANEL
ALK PHOS: 55 U/L (ref 39–117)
ALT: 22 U/L (ref 0–35)
AST: 16 U/L (ref 0–37)
Albumin: 3.9 g/dL (ref 3.5–5.2)
BILIRUBIN TOTAL: 0.6 mg/dL (ref 0.2–1.2)
BUN: 14 mg/dL (ref 6–23)
CO2: 25 mEq/L (ref 19–32)
Calcium: 8.6 mg/dL (ref 8.4–10.5)
Chloride: 103 mEq/L (ref 96–112)
Creatinine, Ser: 0.9 mg/dL (ref 0.4–1.2)
GFR: 73.67 mL/min (ref >60.00)
Glucose, Bld: 88 mg/dL (ref 70–99)
Potassium: 3.4 mEq/L — ABNORMAL LOW (ref 3.5–5.1)
SODIUM: 135 meq/L (ref 135–145)
TOTAL PROTEIN: 7.1 g/dL (ref 6.0–8.3)

## 2014-07-13 LAB — LIPID PANEL
Cholesterol: 167 mg/dL (ref 0–200)
HDL: 36.6 mg/dL — ABNORMAL LOW (ref >39.00)
LDL CALC: 104 mg/dL — AB (ref 0–99)
NonHDL: 130.4
TRIGLYCERIDES: 132 mg/dL (ref 0.0–149.0)
Total CHOL/HDL Ratio: 5
VLDL: 26.4 mg/dL (ref 0.0–40.0)

## 2014-07-17 ENCOUNTER — Encounter: Payer: BC Managed Care – PPO | Admitting: Internal Medicine

## 2014-07-23 ENCOUNTER — Encounter: Payer: BC Managed Care – PPO | Admitting: Internal Medicine

## 2014-07-23 DIAGNOSIS — Z0289 Encounter for other administrative examinations: Secondary | ICD-10-CM

## 2014-08-06 ENCOUNTER — Other Ambulatory Visit: Payer: Self-pay | Admitting: Family Medicine

## 2014-08-06 NOTE — Telephone Encounter (Signed)
Pt called for status or refill and advised [facility]. Pt voiced understanding.

## 2014-08-13 ENCOUNTER — Encounter: Payer: BC Managed Care – PPO | Admitting: Internal Medicine

## 2014-08-13 DIAGNOSIS — Z0289 Encounter for other administrative examinations: Secondary | ICD-10-CM

## 2014-12-09 ENCOUNTER — Encounter (HOSPITAL_COMMUNITY): Payer: Self-pay | Admitting: Emergency Medicine

## 2014-12-09 ENCOUNTER — Emergency Department (HOSPITAL_COMMUNITY)
Admission: EM | Admit: 2014-12-09 | Discharge: 2014-12-09 | Disposition: A | Discharge status: Home / Self Care | Payer: BLUE CROSS/BLUE SHIELD | Source: Home / Self Care | Attending: Emergency Medicine | Admitting: Emergency Medicine

## 2014-12-09 DIAGNOSIS — J069 Acute upper respiratory infection, unspecified: Secondary | ICD-10-CM

## 2014-12-09 LAB — POCT RAPID STREP A: STREPTOCOCCUS, GROUP A SCREEN (DIRECT): NEGATIVE

## 2014-12-09 MED ORDER — HYDROCOD POLST-CHLORPHEN POLST 10-8 MG/5ML PO LQCR: 5.0000 mL | Freq: Two times a day (BID) | ORAL | Status: DC | PRN

## 2014-12-09 MED ORDER — IPRATROPIUM BROMIDE 0.06 % NA SOLN: 2.0000 | Freq: Four times a day (QID) | NASAL | Status: DC

## 2014-12-09 NOTE — ED Provider Notes (Signed)
   Chief Complaint   Cough   History of Present Illness   Jamie Rowland is a 54 year old female who has had a two-day history of cough productive of clear sputum, aching in her rib cage area, nasal congestion, headache, and sore throat. 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 She denies any fever or GI symptoms.  Review of Systems   Other than as noted above, the patient denies any of the following symptoms: Systemic:  No fevers, chills, sweats, or myalgias. Eye:  No redness or discharge. ENT:  No ear pain, headache, nasal congestion, drainage, sinus pressure, or sore throat. Neck:  No neck pain, stiffness, or swollen glands. Lungs:  No cough, sputum production, hemoptysis, wheezing, chest tightness, shortness of breath or chest pain. GI:  No abdominal pain, nausea, vomiting or diarrhea.  PMFSH   Past medical history, family history, social history, meds, and allergies were reviewed. She is allergic to codeine but is able to take hydrocodone. She takes oxybutynin and Singulair. She has a history of asthma and bladder instability.  Physical exam   Vital signs:  BP 110/67 mmHg  Pulse 98  Temp(Src) 97.9 F (36.6 C) (Oral)  Resp 18  SpO2 95% General:  Alert and oriented.  In no distress.  Skin warm and dry. Eye:  No conjunctival injection or drainage. Lids were normal. ENT:  TMs and canals were normal, without erythema or inflammation.  Nasal mucosa was clear and uncongested, without drainage.  Mucous membranes were moist.  Pharynx was erythematous with no exudate or drainage.  There were no oral ulcerations or lesions. Neck:  Supple, no adenopathy, tenderness or mass. Lungs:  No respiratory distress.  Lungs were clear to auscultation, without wheezes, rales or rhonchi.  Breath sounds were clear and equal bilaterally.  Heart:  Regular rhythm, without gallops, murmers or rubs. Skin:  Clear, warm, and dry, without rash or  lesions.  Labs   Results for orders placed or performed during the hospital encounter of 12/09/14  POCT rapid strep A [facility]  Result Value Ref Range   Streptococcus, Group A Screen (Direct) NEGATIVE NEGATIVE   bimanually lanugo becoming Assessment     The encounter diagnosis was Viral URI.  There is no evidence of pneumonia, strep throat, sinusitis, otitis media.    Plan    1.  Meds:  The following meds were prescribed:   Discharge Medication List as of 12/09/2014  7:21 PM    START taking these medications   Details  chlorpheniramine-HYDROcodone (TUSSIONEX) 10-8 MG/5ML LQCR Take 5 mLs by mouth every 12 (twelve) hours as needed for cough., Starting 12/09/2014, Until Discontinued, Normal    ipratropium (ATROVENT) 0.06 % nasal spray Place 2 sprays into both nostrils 4 (four) times daily., Starting 12/09/2014, Until Discontinued, Normal        2.  Patient Education/Counseling:  The patient was given appropriate handouts, self care instructions, and instructed in symptomatic relief.  Instructed to get extra fluids and extra rest.    3.  Follow up:  The patient was told to follow up here if no better in 3 to 4 days, or sooner if becoming worse in any way, and given some red flag symptoms such as increasing fever, difficulty breathing, chest pain, or persistent vomiting which would prompt immediate return.       Reuben Likesavid C Daniella Dewberry, MD 12/09/14 2138

## 2014-12-09 NOTE — Discharge Instructions (Signed)

## 2014-12-09 NOTE — ED Notes (Signed)
Pt states that she has had a cough since 12/08/2014 and she feels it has gotten worse today.

## 2014-12-12 LAB — CULTURE, GROUP A STREP: Strep A Culture: NEGATIVE

## 2014-12-17 ENCOUNTER — Other Ambulatory Visit: Payer: Self-pay | Admitting: Family Medicine

## 2015-01-07 ENCOUNTER — Other Ambulatory Visit: Payer: Self-pay | Admitting: Family Medicine

## 2015-01-21 ENCOUNTER — Other Ambulatory Visit: Payer: Self-pay

## 2015-01-21 ENCOUNTER — Encounter: Payer: Self-pay | Admitting: Primary Care

## 2015-01-21 ENCOUNTER — Ambulatory Visit (INDEPENDENT_AMBULATORY_CARE_PROVIDER_SITE_OTHER): Payer: BLUE CROSS/BLUE SHIELD | Admitting: Primary Care

## 2015-01-21 ENCOUNTER — Ambulatory Visit (INDEPENDENT_AMBULATORY_CARE_PROVIDER_SITE_OTHER)
Admission: RE | Admit: 2015-01-21 | Discharge: 2015-01-21 | Disposition: A | Discharge status: Home / Self Care | Payer: BLUE CROSS/BLUE SHIELD | Source: Ambulatory Visit | Attending: Primary Care | Admitting: Primary Care

## 2015-01-21 VITALS — BP 124/80 | HR 76 | Temp 98.3°F | Ht 65.5 in | Wt 241.8 lb

## 2015-01-21 DIAGNOSIS — M5441 Lumbago with sciatica, right side: Secondary | ICD-10-CM

## 2015-01-21 DIAGNOSIS — M5442 Lumbago with sciatica, left side: Secondary | ICD-10-CM | POA: Diagnosis not present

## 2015-01-21 DIAGNOSIS — M545 Low back pain, unspecified: Secondary | ICD-10-CM | POA: Insufficient documentation

## 2015-01-21 DIAGNOSIS — J309 Allergic rhinitis, unspecified: Secondary | ICD-10-CM

## 2015-01-21 MED ORDER — CYCLOBENZAPRINE HCL 5 MG PO TABS: 5.0000 mg | ORAL_TABLET | Freq: Three times a day (TID) | ORAL | Status: DC | PRN

## 2015-01-21 NOTE — Progress Notes (Signed)
Pre visit review using our clinic review tool, if applicable. No additional management support is needed unless otherwise documented below in the visit note. 

## 2015-01-21 NOTE — Assessment & Plan Note (Signed)
Bilateral with sciatica. Positive straight leg raise from supine position bilaterally. Lumbar xray today. Depending on xray, will consider MRI and referral to ortho if necessary. Flexeril for muscle spasms, ibuprofen for pain. She is not interested in PT again due to failure of therapy once before.

## 2015-01-21 NOTE — Telephone Encounter (Signed)
Pt left v/m requesting cb regarding denial of singulair; Dr Dayton MartesAron has not seen pt since 2013, so pt would need appt to be seen; Pt saw Mayra ReelKate Clark NP today for back pain and I did not know if you felt comfortable refilling Singulair or not. Please advise. Pt request cb. [facility].

## 2015-01-21 NOTE — Patient Instructions (Signed)
Complete xray(s) prior to leaving today. I will contact you with your results. You may take Flexeril (muscle relaxer) up to three times a day as needed. This may make you drowsy, so start slowly.  I hope you feel better soon!  Back Pain, Adult Low back pain is very common. About 1 in 5 people have back pain.The cause of low back pain is rarely dangerous. The pain often gets better over time.About half of people with a sudden onset of back pain feel better in just 2 weeks. About 8 in 10 people feel better by 6 weeks.  CAUSES Some common causes of back pain include:  Strain of the muscles or ligaments supporting the spine.  Wear and tear (degeneration) of the spinal discs.  Arthritis.  Direct injury to the back. DIAGNOSIS Most of the time, the direct cause of low back pain is not known.However, back pain can be treated effectively even when the exact cause of the pain is unknown.Answering your caregiver's questions about your overall health and symptoms is one of the most accurate ways to make sure the cause of your pain is not dangerous. If your caregiver needs more information, he or she may order lab work or imaging tests (X-rays or MRIs).However, even if imaging tests show changes in your back, this usually does not require surgery. HOME CARE INSTRUCTIONS For many people, back pain returns.Since low back pain is rarely dangerous, it is often a condition that people can learn to [facility] Hospitalmanageon their own.   Remain active. It is stressful on the back to sit or stand in one place. Do not sit, drive, or stand in one place for more than 30 minutes at a time. Take short walks on level surfaces as soon as pain allows.Try to increase the length of time you walk each day.  Do not stay in bed.Resting more than 1 or 2 days can delay your recovery.  Do not avoid exercise or work.Your body is made to move.It is not dangerous to be active, even though your back may hurt.Your back will likely heal  faster if you return to being active before your pain is gone.  Pay attention to your body when you bend and lift. Many people have less discomfortwhen lifting if they bend their knees, keep the load close to their bodies,and avoid twisting. Often, the most comfortable positions are those that put less stress on your recovering back.  Find a comfortable position to sleep. Use a firm mattress and lie on your side with your knees slightly bent. If you lie on your back, put a pillow under your knees.  Only take over-the-counter or prescription medicines as directed by your caregiver. Over-the-counter medicines to reduce pain and inflammation are often the most helpful.Your caregiver may prescribe muscle relaxant drugs.These medicines help dull your pain so you can more quickly return to your normal activities and healthy exercise.  Put ice on the injured area.  Put ice in a plastic bag.  Place a towel between your skin and the bag.  Leave the ice on for 15-20 minutes, 03-04 times a day for the first 2 to 3 days. After that, ice and heat may be alternated to reduce pain and spasms.  Ask your caregiver about trying back exercises and gentle massage. This may be of some benefit.  Avoid feeling anxious or stressed.Stress increases muscle tension and can worsen back pain.It is important to recognize when you are anxious or stressed and learn ways to manage it.Exercise is  a great option. SEEK MEDICAL CARE IF:  You have pain that is not relieved with rest or medicine.  You have pain that does not improve in 1 week.  You have new symptoms.  You are generally not feeling well. SEEK IMMEDIATE MEDICAL CARE IF:   You have pain that radiates from your back into your legs.  You develop new bowel or bladder control problems.  You have unusual weakness or numbness in your arms or legs.  You develop nausea or vomiting.  You develop abdominal pain.  You feel faint. Document Released:  10/02/2005 Document Revised: 04/02/2012 Document Reviewed: 02/03/2014 [facility] Patient Information 2015 [place], [place]. This information is not intended to replace advice given to you by your health care provider. Make sure you discuss any questions you have with your health care provider.

## 2015-01-21 NOTE — Progress Notes (Signed)
Subjective:    Patient ID: Jamie Rowland, female    DOB: 07/16/1961, 54 y.o.   MRN: [id number]  HPI  Jamie Rowland is a 54 year old female who presents today with a chief complaint of low back pain that is present bilaterally. The pain has been present intermittently for one year, but has become worse over the past month. She reports occasional radiculopathy bilaterally and the pain is worse when sitting or laying down for a long period of time. She denies recent injury or trauma. She's taken aleve and ibuprofen without help and does not take tylenol. 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 Hot tub soaks help, otherwise nothing will completely relieve her pain. She's tried physical therapy for her back one year ago for several months but reports it did not help.   Review of Systems  Constitutional: Negative for fever and chills.  Respiratory: Negative for shortness of breath.   Cardiovascular: Negative for chest pain.  Musculoskeletal: Positive for back pain and arthralgias.  Neurological: Negative for dizziness.       Past Medical History  Diagnosis Date  . Asthma   . Seasonal allergies   . Migraine headache   . Bronchitis     History   Social History  . Marital Status: Married    Spouse Name: N/A  . Number of Children: 3  . Years of Education: N/A   Occupational History  . Student     Social History Main Topics  . Smoking status: Never Smoker   . Smokeless tobacco: Never Used  . Alcohol Use: No  . Drug Use: No  . Sexual Activity: Yes   Other Topics Concern  . 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 Knee arthroscopy Right 2013  . Laparoscopic cholecystectomy  1991  . Tubal ligation   2002  . Cesarean section  [id number], 2002    Family History  Problem Relation Age of Onset  . Hypertension Mother   . Diverticulitis Mother   . Fibromyalgia Mother   . Cancer Sister     uterine  . Diabetes Maternal Uncle   . Heart disease Maternal Uncle   . Asthma Maternal Grandmother   . Heart disease Maternal Grandmother   . Emphysema Maternal Grandmother   . Colon cancer Neg Hx   . Esophageal cancer Neg Hx   . Rectal cancer Neg Hx   . Stomach cancer Neg Hx   . Emphysema Maternal Grandmother     smoked  . Heart failure Mother     Allergies  Allergen Reactions  . Codeine Sulfate     REACTION: Hives, can't breathe  . Latex Hives    Current Outpatient Prescriptions on File Prior to Visit  Medication Sig Dispense Refill  . budesonide-formoterol (SYMBICORT) 80-4.5 MCG/ACT inhaler Take 2 puffs first thing in am and then another 2 puffs about 12 hours later. 1 Inhaler 11  . ipratropium (ATROVENT) 0.06 % nasal spray Place 2 sprays into both nostrils 4 (four) times daily. 15 mL 12  . montelukast (SINGULAIR) 10 MG tablet TAKE ONE TABLET BY MOUTH EVERY NIGHT AT BEDTIME 30 tablet 2  . oxybutynin (DITROPAN-XL) 10 MG  24 hr tablet TAKE 1 TABLET BY MOUTH TWICE A DAY 60 tablet 3  . albuterol (PROVENTIL) (2.5 MG/3ML) 0.083% nebulizer solution Take 3 mLs (2.5 mg total) by nebulization once. 150 mL 1  . albuterol (PROVENTIL) (2.5 MG/3ML) 0.083% nebulizer solution Take 2.5 mg by nebulization every 6 (six) hours as needed for wheezing or shortness of breath.    . chlorpheniramine-HYDROcodone (TUSSIONEX) 10-8 MG/5ML LQCR Take 5 mLs by mouth every 12 (twelve) hours as needed for cough. (Patient not taking: Reported on 01/21/2015) 140 mL 0  . famotidine (PEPCID) 20 MG tablet One at bedtime (Patient not taking: Reported on 01/21/2015) 30 tablet 11  . fexofenadine (ALLEGRA) 180 MG tablet Take 180 mg by mouth daily.    Marland Kitchen HYDROcodone-acetaminophen (NORCO/VICODIN) 5-325 MG per tablet Take 1-2 tablets by  mouth every 6 hours as needed for pain. (Patient not taking: Reported on 01/21/2015) 15 tablet 0  . pantoprazole (PROTONIX) 40 MG tablet Take 1 tablet (40 mg total) by mouth daily. Take 30-60 min before first meal of the day (Patient not taking: Reported on 01/21/2015) 30 tablet 2  . predniSONE (DELTASONE) 20 MG tablet 3 tabs po daily x 3 days, then 2 tabs x 3 days, then 1.5 tabs x 3 days, then 1 tab x 3 days, then 0.5 tabs x 3 days (Patient not taking: Reported on 01/21/2015) 27 tablet 0  . SUMAtriptan (IMITREX) 100 MG tablet Take one at onset of headache    . VENTOLIN HFA 108 (90 BASE) MCG/ACT inhaler INHALE 2 PUFFS INTO THE LUNGS EVERY 6 HOURS AS NEEDED (Patient not taking: Reported on 01/21/2015) 18 g 2   No current facility-administered medications on file prior to visit.    BP 124/80 mmHg  Pulse 76  Temp(Src) 98.3 F (36.8 C) (Oral)  Ht 5' 5.5" (1.664 m)  Wt 241 lb 12.8 oz (109.68 kg)  BMI 39.61 kg/m2  SpO2 95%    Objective:   Physical Exam  Constitutional: She is oriented to person, place, and time. She appears well-developed.  Cardiovascular: Normal rate and regular rhythm.   Pulmonary/Chest: Effort normal and breath sounds normal.  Musculoskeletal:       Lumbar back: She exhibits decreased range of motion and pain.  Positive straight leg raise from supine position bilaterally.  Neurological: She is alert and oriented to person, place, and time. No cranial nerve deficit.  Skin: Skin is warm and dry.          Assessment & Plan:

## 2015-01-22 ENCOUNTER — Telehealth: Payer: Self-pay | Admitting: Primary Care

## 2015-01-22 DIAGNOSIS — F40240 Claustrophobia: Secondary | ICD-10-CM

## 2015-01-22 MED ORDER — DIAZEPAM 5 MG PO TABS: ORAL_TABLET | ORAL | Status: DC

## 2015-01-22 NOTE — Telephone Encounter (Signed)
Will you please phone in the Valium for Jamie Rowland. She will take one tablet 45 minutes prior to her MRI. Please instruct her to have a driver take her to and from her MRI as this medication will make her drowsy.  Thank you!

## 2015-01-22 NOTE — Telephone Encounter (Signed)
Pt left v/m requesting cb regarding denial of singulair; Dr Dayton MartesAron has not seen pt since 2013, so pt would need appt to be seen; Pt saw Mayra ReelKate Svetlana Bagby NP today for back pain and I did not know if you felt comfortable refilling Singulair or not. Please advise. Pt request cb. [place].  Discussed xray results with patient and she's agreeable to a MRI and referral to ortho. Will not refill Singulair as we did not discuss this during her visit. She will need to schedule an appointment with Dr. Dayton MartesAron for follow up.

## 2015-01-22 NOTE — Telephone Encounter (Signed)
Called and notified patient of Jamie Rowland's comments. Patient verbalized understanding.  

## 2015-01-22 NOTE — Telephone Encounter (Signed)
[facility].

## 2015-02-03 ENCOUNTER — Ambulatory Visit (HOSPITAL_COMMUNITY): Payer: BLUE CROSS/BLUE SHIELD

## 2015-02-09 ENCOUNTER — Other Ambulatory Visit: Payer: Self-pay | Admitting: Family Medicine

## 2015-02-11 ENCOUNTER — Other Ambulatory Visit: Payer: Self-pay | Admitting: *Deleted

## 2015-02-11 MED ORDER — OXYBUTYNIN CHLORIDE ER 10 MG PO TB24: 10.0000 mg | ORAL_TABLET | Freq: Two times a day (BID) | ORAL | Status: DC

## 2015-02-11 MED ORDER — MONTELUKAST SODIUM 10 MG PO TABS: 10.0000 mg | ORAL_TABLET | Freq: Every day | ORAL | Status: DC

## 2015-02-11 NOTE — Telephone Encounter (Signed)
Pt due for CPE, no show 07/2014.  #30 sent to pharmacy.  CPE scheduled 02/15/15

## 2015-02-15 ENCOUNTER — Ambulatory Visit (INDEPENDENT_AMBULATORY_CARE_PROVIDER_SITE_OTHER): Payer: BLUE CROSS/BLUE SHIELD | Admitting: Family Medicine

## 2015-02-15 ENCOUNTER — Other Ambulatory Visit (HOSPITAL_COMMUNITY)
Admission: RE | Admit: 2015-02-15 | Discharge: 2015-02-15 | Disposition: A | Discharge status: Home / Self Care | Payer: BLUE CROSS/BLUE SHIELD | Source: Ambulatory Visit | Attending: Family Medicine | Admitting: Family Medicine

## 2015-02-15 ENCOUNTER — Encounter: Payer: Self-pay | Admitting: Family Medicine

## 2015-02-15 VITALS — BP 118/68 | HR 75 | Temp 98.2°F | Ht 64.5 in | Wt 241.0 lb

## 2015-02-15 DIAGNOSIS — Z1151 Encounter for screening for human papillomavirus (HPV): Secondary | ICD-10-CM | POA: Diagnosis present

## 2015-02-15 DIAGNOSIS — E669 Obesity, unspecified: Secondary | ICD-10-CM | POA: Diagnosis not present

## 2015-02-15 DIAGNOSIS — Z0000 Encounter for general adult medical examination without abnormal findings: Secondary | ICD-10-CM

## 2015-02-15 DIAGNOSIS — Z01419 Encounter for gynecological examination (general) (routine) without abnormal findings: Secondary | ICD-10-CM | POA: Insufficient documentation

## 2015-02-15 DIAGNOSIS — M545 Low back pain: Secondary | ICD-10-CM

## 2015-02-15 NOTE — Progress Notes (Signed)
Pre visit review using our clinic review tool, if applicable. No additional management support is needed unless otherwise documented below in the visit note. 

## 2015-02-15 NOTE — Assessment & Plan Note (Signed)
Pap smear done today

## 2015-02-15 NOTE — Assessment & Plan Note (Signed)
Reviewed preventive care protocols, scheduled due services, and updated immunizations Discussed nutrition, exercise, diet, and healthy lifestyle.  Mammogram ordered- pt to set up.

## 2015-02-15 NOTE — Patient Instructions (Signed)
Good to see you. Please try eating small snacks/meals throughout day. Keep track of your calories- try Myfitness pal.  Please call to set up your mammogram.

## 2015-02-15 NOTE — Assessment & Plan Note (Signed)
Deteriorated. Discussed this at length with [place]. She has to work on limiting her calories and eating smaller meals throughout the day.  Talked about free apps like Myfitness pal to track her calories. She will try this.  Hopefully once she loses some weight, back pain will improve and she can start walking again. The patient indicates understanding of these issues and agrees with the plan.

## 2015-02-15 NOTE — Addendum Note (Signed)
Addended by: Desmond DikeKNIGHT, Londyn Wotton H on: 02/15/2015 10:22 AM   Modules accepted: Orders

## 2015-02-15 NOTE — Progress Notes (Signed)
Subjective:   Patient ID: Jamie Rowland, female    DOB: June 19, 1961, 54 y.o.   MRN: [id number]  Jamie Rowland is a pleasant 54 y.o. year old female who presents to clinic today with Annual Exam  on 02/15/2015  HPI:  Low back pain- saw Jerelene Redden- note reviewed.  Having MRI this month.  Dg Lumbar Spine Complete  01/21/2015   CLINICAL DATA:  One year history of bilateral low back pain, positive straight leg raise sign common no recent trauma, no bowel or bladder issues.  EXAM: LUMBAR SPINE - COMPLETE 4+ VIEW  COMPARISON:  None.  FINDINGS: The lumbar vertebral bodies are preserved in height. There is narrowing of the disc spaces at L1-L2, L2-L3, and L3-L4. There are anterior endplate osteophytes at these levels. There is subtle anterolisthesis of L3 with respect L2 and L4 with respect L3 likely on the basis of degenerative disc change. There is mild facet joint hypertrophy L4-5 and L5-S1. The pedicles and transverse processes are intact. The observed portions of the sacrum are normal.  IMPRESSION: There is degenerative disc space narrowing in the upper and mid lumbar spine. There is no compression fracture or other acute bony abnormality.   Electronically Signed   By: David  [place]   On: 01/21/2015 16:25    Due for pap smear. LMP was 13 years ago.  No post menopausal bleeding.  Sister had cervical CA.  Unsure when she last had a pap smear -? 2012  Colonoscopy 11/18/13- Dr. Arlyce Dice. Mammogram 2014- due.  Obesity-  Has gained a lot of weight.  Has been working full time and in school.  Tends to only eat one meal a day at night when her husband comes home from work.  Back started hurting as she put on the weight and now she feels she cannot exercise because of the back pain. Wt Readings from Last 3 Encounters:  02/15/15 241 lb (109.317 kg)  01/21/15 241 lb 12.8 oz (109.68 kg)  03/27/14 225 lb 12.8 oz (102.422 kg)    Labs done in 06/2014. Lab Results  Component Value Date   CHOL 167 07/13/2014   HDL 36.60* 07/13/2014   LDLCALC 104* 07/13/2014   LDLDIRECT 108.4 07/04/2013   TRIG 132.0 07/13/2014   CHOLHDL 5 07/13/2014   Lab Results  Component Value Date   CREATININE 0.9 07/13/2014   Lab Results  Component Value Date   TSH 2.98 07/04/2013   Lab Results  Component Value Date   WBC 10.1 07/13/2014   HGB 13.3 07/13/2014   HCT 39.4 07/13/2014   MCV 94.0 07/13/2014   PLT 234.0 07/13/2014    Current Outpatient Prescriptions on File Prior to Visit  Medication Sig Dispense Refill  . budesonide-formoterol (SYMBICORT) 80-4.5 MCG/ACT inhaler Take 2 puffs first thing in am and then another 2 puffs about 12 hours later. 1 Inhaler 11  . diazepam (VALIUM) 5 MG tablet Take one tablet by mouth 45 minutes before your MRI. 2 tablet 0  . famotidine (PEPCID) 20 MG tablet One at bedtime 30 tablet 11  . montelukast (SINGULAIR) 10 MG tablet Take 1 tablet (10 mg total) by mouth at bedtime. 30 tablet 0  . oxybutynin (DITROPAN-XL) 10 MG 24 hr tablet Take 1 tablet (10 mg total) by mouth 2 (two) times daily. 60 tablet 0   No current facility-administered medications on file prior to visit.    Allergies  Allergen Reactions  . Codeine Sulfate     REACTION: Hives, can't breathe  .  Latex Hives    Past Medical History  Diagnosis Date  . Asthma   . Seasonal allergies   . Migraine headache   . Bronchitis     Past Surgical History  Procedure Laterality Date  . Knee arthroscopy Right 2013  . Laparoscopic cholecystectomy  1991  . Tubal ligation  2002  . Cesarean section  [id number], 2002    Family History  Problem Relation Age of Onset  . Hypertension Mother   . Diverticulitis Mother   . Fibromyalgia Mother   . Cancer Sister     uterine  . Diabetes Maternal Uncle   . Heart disease Maternal Uncle   . Asthma Maternal Grandmother   . Heart disease Maternal Grandmother   . Emphysema Maternal Grandmother   . Colon cancer Neg Hx   . Esophageal cancer Neg Hx   . Rectal cancer Neg Hx     . Stomach cancer Neg Hx   . Emphysema Maternal Grandmother     smoked  . Heart failure Mother     History   Social History  . Marital Status: Married    Spouse Name: N/A  . Number of Children: 3  . Years of Education: N/A   Occupational History  . Student     Social History Main Topics  . Smoking status: Never Smoker   . Smokeless tobacco: Never Used  . Alcohol Use: No  . Drug Use: No  . Sexual Activity: Yes   Other Topics Concern  . 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    Review of Systems  Constitutional: Negative.   HENT: Negative.   Respiratory: Negative.   Cardiovascular: Negative.   Gastrointestinal: Negative.   Endocrine: Negative.   Genitourinary: Negative.   Musculoskeletal: Positive for back pain.  Skin: Negative.   Allergic/Immunologic: Negative.   Neurological: Negative.   Hematological: Negative.   Psychiatric/Behavioral: Negative.        Objective:    BP 118/68 mmHg  Pulse 75  Temp(Src) 98.2 F (36.8 C) (Oral)  Ht 5' 4.5" (1.638 m)  Wt 241 lb (109.317 kg)  BMI 40.74 kg/m2  SpO2 97%   Physical Exam   General:  Well-developed,well-nourished,in no acute distress; alert,appropriate and cooperative throughout examination Head:  normocephalic and atraumatic.   Eyes:  vision grossly intact, pupils equal, pupils round, and pupils reactive to light.   Ears:  R ear normal and L ear normal.   Nose:  no external deformity.   Mouth:  good dentition.   Neck:  No deformities, masses, or tenderness noted. Breasts:  No mass, nodules, thickening, tenderness, bulging, retraction, inflamation, nipple discharge or skin changes noted.   Lungs:  Normal  respiratory effort, chest expands symmetrically. Lungs are clear to auscultation, no crackles or wheezes. Heart:  Normal rate and regular rhythm. S1 and S2 normal without gallop, murmur, click, rub or other extra sounds. Abdomen:  Bowel sounds positive,abdomen soft and non-tender without masses, organomegaly or hernias noted. Rectal:  no external abnormalities.   Genitalia:  Pelvic Exam:        External: normal female genitalia without lesions or masses        Vagina: normal without lesions or  masses        Cervix: normal without lesions or masses        Adnexa: normal bimanual exam without masses or fullness        Uterus: normal by palpation        Pap smear: performed Msk:  No deformity or scoliosis noted of thoracic or lumbar spine.  SLR neg bilaterally  Extremities:  No clubbing, cyanosis, edema, or deformity noted with normal full range of motion of all joints.   Neurologic:  alert & oriented X3 and gait normal.   Skin:  Intact without suspicious lesions or rashes Cervical Nodes:  No lymphadenopathy noted Axillary Nodes:  No palpable lymphadenopathy Psych:  Cognition and judgment appear intact. Alert and cooperative with normal attention span and concentration. No apparent delusions, illusions, hallucinations       Assessment & Plan:   Encounter for routine gynecological examination  Routine general medical examination at a health care facility No Follow-up on file.

## 2015-02-16 LAB — CYTOLOGY - PAP

## 2015-02-17 MED ORDER — METRONIDAZOLE 500 MG PO TABS: 500.0000 mg | ORAL_TABLET | Freq: Three times a day (TID) | ORAL | Status: DC

## 2015-02-17 NOTE — Addendum Note (Signed)
Addended by: Desmond DikeKNIGHT, Francia Verry H on: 02/17/2015 03:57 PM   Modules accepted: Orders

## 2015-03-11 ENCOUNTER — Other Ambulatory Visit: Payer: Self-pay | Admitting: *Deleted

## 2015-03-11 MED ORDER — OXYBUTYNIN CHLORIDE ER 10 MG PO TB24: 10.0000 mg | ORAL_TABLET | Freq: Two times a day (BID) | ORAL | Status: DC

## 2015-03-11 MED ORDER — MONTELUKAST SODIUM 10 MG PO TABS: 10.0000 mg | ORAL_TABLET | Freq: Every day | ORAL | Status: DC

## 2015-03-11 NOTE — Telephone Encounter (Signed)
Pt left voicemail at Triage. Pt said she had a CPE on 02/15/15 and she thought that Dr. Dayton MartesAron refilled her Rxs at her CPE appt but she didn't. Pt is requesting you refill all her Rxs (didn't say which Rxs) and reqeust call back

## 2015-03-11 NOTE — Telephone Encounter (Signed)
Filled meds showing to be prescribed by Dr Dayton MartesAron.

## 2015-04-01 ENCOUNTER — Other Ambulatory Visit: Payer: Self-pay | Admitting: Internal Medicine

## 2015-04-12 ENCOUNTER — Telehealth: Payer: Self-pay | Admitting: *Deleted

## 2015-04-12 NOTE — Telephone Encounter (Signed)
Spoke with patient about scheduling mammo. She was out of town, but returning tomorrow and will schedule it at that time. She is aware of the importance.

## 2015-05-07 ENCOUNTER — Ambulatory Visit: Payer: BLUE CROSS/BLUE SHIELD | Admitting: Neurology

## 2015-05-20 ENCOUNTER — Ambulatory Visit: Payer: BLUE CROSS/BLUE SHIELD | Admitting: Neurology

## 2015-05-24 ENCOUNTER — Other Ambulatory Visit: Payer: BLUE CROSS/BLUE SHIELD

## 2015-06-09 ENCOUNTER — Ambulatory Visit (INDEPENDENT_AMBULATORY_CARE_PROVIDER_SITE_OTHER): Payer: BLUE CROSS/BLUE SHIELD | Admitting: Internal Medicine

## 2015-06-09 ENCOUNTER — Encounter: Payer: Self-pay | Admitting: Internal Medicine

## 2015-06-09 VITALS — BP 134/68 | HR 88 | Ht 65.0 in | Wt 248.8 lb

## 2015-06-09 DIAGNOSIS — E669 Obesity, unspecified: Secondary | ICD-10-CM | POA: Diagnosis not present

## 2015-06-09 DIAGNOSIS — J4530 Mild persistent asthma, uncomplicated: Secondary | ICD-10-CM | POA: Diagnosis not present

## 2015-06-10 ENCOUNTER — Encounter: Payer: Self-pay | Admitting: Internal Medicine

## 2015-06-10 MED ORDER — FAMOTIDINE 20 MG PO TABS: 20.0000 mg | ORAL_TABLET | Freq: Two times a day (BID) | ORAL | Status: DC

## 2015-06-10 NOTE — Patient Instructions (Addendum)
Symbicort 80 Take 2 puffs first thing in am and then another 2 puffs about 12 hours later.   Only use your albuterol as a rescue medication to be used if you can't catch your breath by resting or doing a relaxed purse lip breathing pattern.  - The less you use it, the better it will work when you need it. - Ok to use up to 2 puffs  every 4 hours if you must but call for immediate appointment if use goes up over your usual need - Don't leave home without it !!  (think of it like the spare tire for your car)    If you are satisfied with your treatment plan,  let your doctor know and he/she can either refill your medications or you can return here when your prescription runs out.     If in any way you are not 100% satisfied,  please tell us.  If 100% better, tell your friends!  Pulmonary follow up is as needed

## 2015-06-10 NOTE — Assessment & Plan Note (Signed)
As long as has access to maint rx (laba/ics) All goals of chronic asthma control met including optimal function and elimination of symptoms with minimal need for rescue therapy.  Contingencies discussed in full including contacting this office immediately if not controlling the symptoms using the rule of two's.    The proper method of use, as well as anticipated side effects, of a metered-dose inhaler are discussed and demonstrated to the patient. Improved effectiveness after extensive coaching during this visit to a level of approximately  90%  I had an extended discussion with the patient reviewing all relevant studies completed to date and  lasting 15 to 20 minutes of a 25 minute visit    Each maintenance medication was reviewed in detail including most importantly the difference between maintenance and prns and under what circumstances the prns are to be triggered using an action plan format that is not reflected in the computer generated alphabetically organized AVS.    Please see instructions for details which were reviewed in writing and the patient given a copy highlighting the part that I personally wrote and discussed at today's ov.

## 2015-06-10 NOTE — Assessment & Plan Note (Signed)
Body mass index is 41.4 kg/(m^2).  Lab Results  Component Value Date   TSH 2.98 07/04/2013     Contributing to gerd tendency/ doe/reviewed need  achieve and maintain neg calorie balance > defer f/u primary care including intermittently monitoring thyroid status

## 2015-06-10 NOTE — Progress Notes (Signed)
Subjective:    Patient ID: Jamie Rowland, female    DOB: Dec 07, 1960  MRN: [id number]   Brief patient profile:  52 yowf never smoker asthma onset  5th or 6th grade including at least one admit but outgrew in HS but then recurrent symptoms a birth of 1st child in 83 and took allergy shots x 2 years and stopped due to cost and lack of benefit and stayed on rescue thereafter and started advair 2013  singulair 2014 but referred 02/16/2014 to pulmonary clinic by Dr Renae Fickle  with dtc asthma x 12/2013.     History of Present Illness  02/16/2014 1st [facility] office visit/ Wert maint on advair/ singulair Chief Complaint  Patient presents with  . Pulmonary Consult    Referred per Nicki Reaper, NP.  Pt states dxed with asthma as a child. She c/o increased SOB and cough x 2 months.  She states that she has "constant SOB, all the time".  Her cough is prod with minimal light green sputum. She is using rescue inhaler 3-4 times per day on average and using neb once at night.   "nothing works but neb" = duoneb Lots of noct cough/ first thing in am  rec Plan A = automatic = singulair and add dulera 100 Take 2 puffs first thing in am and then another 2 puffs about 12 hours later - work on smooth deep breath and out through the nose Plan B = back up  Only use your albuterol (ventolin) as a rescue medication to be used if you can't catch your breath by resting or doing a relaxed purse lip breathing pattern.  - The less you use it, the better it will work when you need it. - Ok to use up to 2 puffs  every 4 hours if you must but call for immediate appointment if use goes up over your usual need - Don't leave home without it !!  (think of it like the spare tire for your car)  Plan C = nebulizer  Up to 4 hours if plan B doesn't work Pantoprazole (protonix) 40 mg   Take 30-60 min before first meal of the day and Pepcid 20 mg one bedtime until return to office - this is the best way to tell whether  stomach acid is contributing to your problem.   GERD diet   03/27/2014 f/u ov/Wert re: asthma/ better while on dulera 100 but insurance wouldn't pay Chief Complaint  Patient presents with  . Follow-up    Pt states that her breathing had improved back to baseline, but then started noticing slight increased in SOB for approx 1 wk- relates to allergies. Cough has resolved. She has noticed wheezing after she goes outside.  She is using rescue inhaler 2-3 times per day, but has not needed neb.     Since stopped dulera breathing definitely worse and increase need f or saba rec Symbicort Take 2 puffs first thing in am and then another 2 puffs about 12 hours later.   Only use your albuterol as a rescue medication to be used if you can't catch your breath by resting or doing a relaxed purse lip breathing pattern.  - The less you use it, the better it will work when you need it. - Ok to use up to 2 puffs  every 4 hours if you must but call for immediate appointment if use goes up over your usual need - Don't leave home without it !!  (  think of it like the spare tire for your car)   Work on inhaler technique:    06/09/2015 f/u ov/Wert re: chronic asthma worse sob off symbicort and increase need for saba  Chief Complaint  Patient presents with  . Follow-up    Pt c/o increased SOB for the past month. She is requesting refill on Symbiort.        No obvious day to day or daytime variabilty(except worse in hot/humid conditions) or assoc  cough or cp or chest tightness, subjective wheeze overt sinus or hb symptoms. No unusual exp hx or h/o childhood pna/ asthma or knowledge of premature birth.  Sleeping ok without nocturnal  or early am exacerbation  of respiratory  c/o's or need for noct saba. Also denies any obvious fluctuation of symptoms with weather or environmental changes or other aggravating or alleviating factors except as outlined above   Current Medications, Allergies, Complete Past Medical  History, Past Surgical History, Family History, and Social History were reviewed in [employer] record.  ROS  The following are not active complaints unless bolded sore throat, dysphagia, dental problems, itching, sneezing,  nasal congestion or excess/ purulent secretions, ear ache,   fever, chills, sweats, unintended wt loss, pleuritic or exertional cp, hemoptysis,  orthopnea pnd or leg swelling, presyncope, palpitations, heartburn, abdominal pain, anorexia, nausea, vomiting, diarrhea  or change in bowel or urinary habits, change in stools or urine, dysuria,hematuria,  rash, arthralgias, visual complaints, headache, numbness weakness or ataxia or problems with walking or coordination,  change in mood/affect or memory.                          Objective:   Physical Exam  amb wf      03/27/2014   226 > 06/09/2015  248  Wt Readings from Last 3 Encounters:  02/16/14 227 lb 12.8 oz (103.329 kg)  02/05/14 222 lb 8 oz (100.925 kg)  12/23/13 220 lb 8 oz (100.018 kg)     HEENT: nl dentition, turbinates, and orophanx. Nl external ear canals without cough reflex   NECK :  without JVD/Nodes/TM/ nl carotid upstrokes bilaterally   LUNGS: no acc muscle use, clear to A and P bilaterally without cough on insp or exp maneuvers   CV:  RRR  no s3 or murmur or increase in P2, no edema   ABD:  soft and nontender with nl excursion in the supine position. No bruits or organomegaly, bowel sounds nl  MS:  warm without deformities, calf tenderness, cyanosis or clubbing  SKIN: warm and dry without lesions    NEURO:  alert, approp, no deficits    No recent cxr on file       Assessment & Plan:

## 2015-06-14 ENCOUNTER — Telehealth: Payer: Self-pay

## 2015-06-14 ENCOUNTER — Other Ambulatory Visit: Payer: Self-pay | Admitting: Family Medicine

## 2015-06-14 DIAGNOSIS — Z1231 Encounter for screening mammogram for malignant neoplasm of breast: Secondary | ICD-10-CM

## 2015-06-14 NOTE — Telephone Encounter (Signed)
Called patient to notify her of being due for a Mammogram. She states that she will schedule one with [facility].

## 2015-06-18 ENCOUNTER — Ambulatory Visit: Payer: BLUE CROSS/BLUE SHIELD

## 2015-10-28 ENCOUNTER — Other Ambulatory Visit: Payer: Self-pay | Admitting: Family Medicine

## 2015-11-26 ENCOUNTER — Ambulatory Visit: Payer: BLUE CROSS/BLUE SHIELD | Admitting: Internal Medicine

## 2015-11-29 ENCOUNTER — Telehealth: Payer: Self-pay | Admitting: Family Medicine

## 2015-11-29 NOTE — Telephone Encounter (Signed)
Pt wants to switch from dr Dayton Martes to Jamie Rowland it ok?

## 2015-11-29 NOTE — Telephone Encounter (Signed)
Ok with me 

## 2015-11-29 NOTE — Telephone Encounter (Signed)
Yes this is ok with me 

## 2015-12-01 NOTE — Telephone Encounter (Signed)
Pt aware.

## 2015-12-23 ENCOUNTER — Ambulatory Visit (INDEPENDENT_AMBULATORY_CARE_PROVIDER_SITE_OTHER): Payer: BLUE CROSS/BLUE SHIELD | Admitting: Internal Medicine

## 2015-12-23 ENCOUNTER — Encounter: Payer: Self-pay | Admitting: Internal Medicine

## 2015-12-23 VITALS — BP 126/82 | HR 81 | Temp 98.9°F | Wt 249.5 lb

## 2015-12-23 DIAGNOSIS — M7051 Other bursitis of knee, right knee: Secondary | ICD-10-CM

## 2015-12-23 MED ORDER — PREDNISONE 10 MG PO TABS: ORAL_TABLET | ORAL | Status: DC

## 2015-12-23 NOTE — Patient Instructions (Signed)
Prepatellar Bursitis With Rehab  Bursitis is a condition that is characterized by inflammation of a bursa. Bursa exists in many areas of the body. They are fluid-filled sacs that lie between a soft tissue (skin, tendon, or ligament) and a bone, and they reduce friction between the structures as well as the stress placed on the soft tissue. Prepatellar bursitis is inflammation of the bursa that lies between the skin and the kneecap (patella). This condition often causes pain over the patella. SYMPTOMS   Pain, tenderness, and/or inflammation over the patella.  Pain that worsens with movement of the knee joint.  Decreased range of motion for the knee joint.  A crackling sound (crepitation) when the bursa is moved or touched.  Occasionally, painless swelling of the bursa.  Fever (when infected). CAUSES  Bursitis is caused by damage to the bursa, which results in an inflammatory response. Common mechanisms of injury include:  Direct trauma to the front of the knee.  Repetitive and/or stressful use of the knee. RISK INCREASES WITH:  Activities in which kneeling and/or falling on one's knees is likely (volleyball or football).  Repetitive and stressful training, especially if it involves running on hills.  Improper training techniques, such as a sudden increase in the intensity, frequency, or duration of training.  Failure to warm up properly before activity.  Poor technique.  Artificial turf. PREVENTION   Avoid kneeling or falling on your knees.  Warm up and stretch properly before activity.  Allow for adequate recovery between workouts.  Maintain physical fitness:  Strength, flexibility, and endurance.  Cardiovascular fitness.  Learn and use proper technique. When possible, have a coach correct improper technique.  Wear properly fitted and padded protective equipment (knee pads). PROGNOSIS  If treated properly, then the symptoms of prepatellar bursitis usually resolve  within 2 weeks. RELATED COMPLICATIONS   Recurrent symptoms that result in a chronic problem.  Prolonged healing time, if improperly treated or reinjured.  Limited range of motion.  Infection of bursa.  Chronic inflammation or scarring of bursa. TREATMENT  Treatment initially involves the use of ice and medication to help reduce pain and inflammation. The use of strengthening and stretching exercises may help reduce pain with activity, especially those of the quadriceps and hamstring muscles. These exercises may be performed at home or with referral to a therapist. Your caregiver may recommend knee pads when you return to playing sports, in order to reduce the stress on the prepatellar bursa. If symptoms persist despite treatment, then your caregiver may drain fluid out with a needle (aspirate) the bursa. If symptoms persist for greater than 6 months despite nonsurgical (conservative) treatment, then surgery may be recommended to remove the bursa.  MEDICATION  If pain medication is necessary, then nonsteroidal anti-inflammatory medications, such as aspirin and ibuprofen, or other minor pain relievers, such as acetaminophen, are often recommended.  Do not take pain medication for 7 days before surgery.  Prescription pain relievers may be given if deemed necessary by your caregiver. Use only as directed and only as much as you need.  Corticosteroid injections may be given by your caregiver. These injections should be reserved for the most serious cases, because they may only be given a certain number of times. HEAT AND COLD  Cold treatment (icing) relieves pain and reduces inflammation. Cold treatment should be applied for 10 to 15 minutes every 2 to 3 hours for inflammation and pain and immediately after any activity that aggravates your symptoms. Use ice packs or massage the   area with a piece of ice (ice massage).  Heat treatment may be used prior to performing the stretching and  strengthening activities prescribed by your caregiver, physical therapist, or athletic trainer. Use a heat pack or soak the injury in warm water. SEEK MEDICAL CARE IF:  Treatment seems to offer no benefit, or the condition worsens.  Any medications produce adverse side effects. EXERCISES RANGE OF MOTION (ROM) AND STRETCHING EXERCISES - Prepatellar Bursitis These exercises may help you when beginning to rehabilitate your injury. Your symptoms may resolve with or without further involvement from your physician, physical therapist or athletic trainer. While completing these exercises, remember:   Restoring tissue flexibility helps normal motion to return to the joints. This allows healthier, less painful movement and activity.  An effective stretch should be held for at least 30 seconds.  A stretch should never be painful. You should only feel a gentle lengthening or release in the stretched tissue. STRETCH - Hamstrings, Standing  Stand or sit and extend your right / left leg, placing your foot on a chair or foot stool  Keeping a slight arch in your low back and your hips straight forward.  Lead with your chest and lean forward at the waist until you feel a gentle stretch in the back of your right / left knee or thigh. (When done correctly, this exercise requires leaning only a small distance.)  Hold this position for __________ seconds. Repeat __________ times. Complete this stretch __________ times per day. STRETCH - Quadriceps, Prone   Lie on your stomach on a firm surface, such as a bed or padded floor.  Bend your right / left knee and grasp your ankle. If you are unable to reach, your ankle or pant leg, use a belt around your foot to lengthen your reach.  Gently pull your heel toward your buttocks. Your knee should not slide out to the side. You should feel a stretch in the front of your thigh and/or knee.  Hold this position for __________ seconds. Repeat __________ times.  Complete this stretch __________ times per day.  STRETCH - Hamstrings/Adductors, V-Sit   Sit on the floor with your legs extended in a large "V," keeping your knees straight.  With your head and chest upright, bend at your waist reaching for your right foot to stretch your left adductors.  You should feel a stretch in your left inner thigh. Hold for __________ seconds.  Return to the upright position to relax your leg muscles.  Continuing to keep your chest upright, bend straight forward at your waist to stretch your hamstrings.  You should feel a stretch behind both of your thighs and/or knees. Hold for __________ seconds.  Return to the upright position to relax your leg muscles.  Repeat steps 2 through 4. Repeat __________ times. Complete this exercise __________ times per day.  STRENGTHENING EXERCISES - Prepatellar Bursitis  These exercises may help you when beginning to rehabilitate your injury. They may resolve your symptoms with or without further involvement from your physician, physical therapist or athletic trainer. While completing these exercises, remember:  Muscles can gain both the endurance and the strength needed for everyday activities through controlled exercises.  Complete these exercises as instructed by your physician, physical therapist or athletic trainer. Progress the resistance and repetitions only as guided. STRENGTH - Quadriceps, Isometrics  Lie on your back with your right / left leg extended and your opposite knee bent.  Gradually tense the muscles in the front of your right /   left thigh. You should see either your kneecap slide up toward your hip or increased dimpling just above the knee. This motion will push the back of the knee down toward the floor/mat/bed on which you are lying.  Hold the muscle as tight as you can without increasing your pain for __________ seconds.  Relax the muscles slowly and completely in between each repetition. Repeat  __________ times. Complete this exercise __________ times per day.  STRENGTH - Quadriceps, Short Arcs   Lie on your back. Place a __________ inch towel roll under your knee so that the knee slightly bends.  Raise only your lower leg by tightening the muscles in the front of your thigh. Do not allow your thigh to rise.  Hold this position for __________ seconds. Repeat __________ times. Complete this exercise __________ times per day.  OPTIONAL ANKLE WEIGHTS: Begin with ____________________, but DO NOT exceed ____________________. Increase in1 lb/0.5 kg increments.  STRENGTH - Quadriceps, Straight Leg Raises  Quality counts! Watch for signs that the quadriceps muscle is working to insure you are strengthening the correct muscles and not "cheating" by substituting with healthier muscles.  Lay on your back with your right / left leg extended and your opposite knee bent.  Tense the muscles in the front of your right / left thigh. You should see either your kneecap slide up or increased dimpling just above the knee. Your thigh may even quiver.  Tighten these muscles even more and raise your leg 4 to 6 inches off the floor. Hold for __________ seconds.  Keeping these muscles tense, lower your leg.  Relax the muscles slowly and completely in between each repetition. Repeat __________ times. Complete this exercise __________ times per day.  STRENGTH - Quadriceps, Step-Ups   Use a thick book, step or step stool that is __________ inches tall.  Holding a wall or counter for balance only, not support.  Slowly step-up with your right / left foot, keeping your knee in line with your hip and foot. Do not allow your knee to bend so far that you cannot see your toes.  Slowly unlock your knee and lower yourself to the starting position. Your muscles, not gravity, should lower you. Repeat __________ times. Complete this exercise __________ times per day.   This information is not intended to replace  advice given to you by your health care provider. Make sure you discuss any questions you have with your health care provider.   Document Released: 10/02/2005 Document Revised: 06/23/2015 Document Reviewed: 01/14/2009 Elsevier Interactive Patient Education 2016 [employer]  

## 2015-12-23 NOTE — Progress Notes (Signed)
Pre visit review using our clinic review tool, if applicable. No additional management support is needed unless otherwise documented below in the visit note. 

## 2015-12-23 NOTE — Progress Notes (Signed)
Subjective:    Patient ID: Jamie Rowland, female    DOB: June 09, 1961, 55 y.o.   MRN: [id number]  HPI  Pt presents to the clinic today with c/o right knee pain. She reports this started 2 weeks ago. She describes the pain as sharp and shooting. She has noticed some swelling as well. The pain is worse when she goes from a sitting to a standing position and walking for long periods of time. She denies any injury to the area. She has tried heat, ice, Aleve and Ibuprofen with minimal relief. She is concerned, because this it the knee she had a meniscal repair on a few years ago.  Review of Systems      Past Medical History  Diagnosis Date  . Asthma   . Seasonal allergies   . Migraine headache   . Bronchitis     Current Outpatient Prescriptions  Medication Sig Dispense Refill  . budesonide-formoterol (SYMBICORT) 80-4.5 MCG/ACT inhaler Take 2 puffs first thing in am and then another 2 puffs about 12 hours later. 1 Inhaler 11  . famotidine (PEPCID) 20 MG tablet Take 1 tablet (20 mg total) by mouth 2 (two) times daily. One at bedtime    . montelukast (SINGULAIR) 10 MG tablet TAKE ONE TABLET BY MOUTH EVERY NIGHT AT BEDTIME 30 tablet 4  . oxybutynin (DITROPAN-XL) 10 MG 24 hr tablet TAKE 1 TABLET BY MOUTH TWICE A DAY 60 tablet 4  . predniSONE (DELTASONE) 10 MG tablet Take 6 tabs day 1, 5 tabs day 2, 4 tabs day 3, 3 tabs day 4, 2 tabs day 5, 1 tab day 6 21 tablet 0   No current facility-administered medications for this visit.    Allergies  Allergen Reactions  . Codeine Sulfate     REACTION: Hives, can't breathe  . Latex Hives    Family History  Problem Relation Age of Onset  . Hypertension Mother   . Diverticulitis Mother   . Fibromyalgia Mother   . Cancer Sister     uterine  . Diabetes Maternal Uncle   . Heart disease Maternal Uncle   . Asthma Maternal Grandmother   . Heart disease Maternal Grandmother   . Emphysema Maternal Grandmother   . Colon cancer Neg Hx   . Esophageal  cancer Neg Hx   . Rectal cancer Neg Hx   . Stomach cancer Neg Hx   . Emphysema Maternal Grandmother     smoked  . Heart failure Mother     Social History   Social History  . Marital Status: Married    Spouse Name: N/A  . Number of Children: 3  . Years of Education: N/A   Occupational History  . Student     Social History Main Topics  . Smoking status: Never Smoker   . Smokeless tobacco: Never Used  . Alcohol Use: No  . Drug Use: No  . Sexual Activity: Yes   Other Topics Concern  . 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  Respiratory: Denies difficulty breathing, shortness of breath, cough or sputum production.   Cardiovascular: Denies chest pain, chest tightness, palpitations or swelling in the hands or feet.  Musculoskeletal: Pt reports right knee pain. Denies decrease in range of  motion, difficulty with gait, muscle pain.   No other specific complaints in a complete review of systems (except as listed in HPI above).  Objective:   Physical Exam  BP 126/82 mmHg  Pulse 81  Temp(Src) 98.9 F (37.2 C) (Oral)  Wt 249 lb 8 oz (113.172 kg)  SpO2 98% Wt Readings from Last 3 Encounters:  12/23/15 249 lb 8 oz (113.172 kg)  06/09/15 248 lb 12.8 oz (112.855 kg)  02/15/15 241 lb (109.317 kg)    General: Appears her stated age, obese in NAD. Skin: Warm, dry and intact.   Cardiovascular: Normal rate and rhythm. S1,S2 noted.  No murmur, rubs or gallops noted.  Pulmonary/Chest: Normal effort and positive vesicular breath sounds. No respiratory distress. No wheezes, rales or ronchi noted.  Musculoskeletal: Normal flexion, extension of the knee. Medial swelling noted. Pain with palpation of medial pes bursa. No difficulty with  gait.   BMET    Component Value Date/Time   NA 135 07/13/2014 1430   K 3.4* 07/13/2014 1430   CL 103 07/13/2014 1430   CO2 25 07/13/2014 1430   GLUCOSE 88 07/13/2014 1430   BUN 14 07/13/2014 1430   CREATININE 0.9 07/13/2014 1430   CALCIUM 8.6 07/13/2014 1430   GFRNONAA 81.49 04/08/2010 0831   GFRAA 77 05/16/2007 0947    Lipid Panel     Component Value Date/Time   CHOL 167 07/13/2014 1430   TRIG 132.0 07/13/2014 1430   HDL 36.60* 07/13/2014 1430   CHOLHDL 5 07/13/2014 1430   VLDL 26.4 07/13/2014 1430   LDLCALC 104* 07/13/2014 1430    CBC    Component Value Date/Time   WBC 10.1 07/13/2014 1430   RBC 4.20 07/13/2014 1430   HGB 13.3 07/13/2014 1430   HCT 39.4 07/13/2014 1430   PLT 234.0 07/13/2014 1430   MCV 94.0 07/13/2014 1430   MCHC 33.8 07/13/2014 1430   RDW 12.9 07/13/2014 1430   LYMPHSABS 3.3 07/04/2013 0827   MONOABS 1.0 07/04/2013 0827   EOSABS 0.5 07/04/2013 0827   BASOSABS 0.1 07/04/2013 0827    Hgb A1C No results found for: HGBA1C       Assessment & Plan:   Right knee pain:   C/W pes bursitis Likely exacerbated by weight- discussed weight loss eRx for Pred Taper x 6 days No additional Aleve or Ibuprofen Tylenol/Ice as needed for pain and inflammation If persist, consider further evaluation with xray  RTC as needed or if symptoms persist or worsen

## 2016-03-30 DIAGNOSIS — H1013 Acute atopic conjunctivitis, bilateral: Secondary | ICD-10-CM | POA: Diagnosis not present

## 2016-03-30 DIAGNOSIS — G44219 Episodic tension-type headache, not intractable: Secondary | ICD-10-CM | POA: Diagnosis not present

## 2016-03-30 DIAGNOSIS — H52533 Spasm of accommodation, bilateral: Secondary | ICD-10-CM | POA: Diagnosis not present

## 2016-05-15 ENCOUNTER — Other Ambulatory Visit: Payer: Self-pay | Admitting: Family Medicine

## 2016-06-21 ENCOUNTER — Other Ambulatory Visit: Payer: Self-pay | Admitting: Family Medicine

## 2016-06-27 ENCOUNTER — Ambulatory Visit: Payer: BLUE CROSS/BLUE SHIELD | Admitting: Internal Medicine

## 2016-07-10 ENCOUNTER — Encounter: Payer: BLUE CROSS/BLUE SHIELD | Admitting: Internal Medicine

## 2016-07-20 ENCOUNTER — Ambulatory Visit (INDEPENDENT_AMBULATORY_CARE_PROVIDER_SITE_OTHER): Payer: BLUE CROSS/BLUE SHIELD | Admitting: Internal Medicine

## 2016-07-20 ENCOUNTER — Encounter: Payer: Self-pay | Admitting: Internal Medicine

## 2016-07-20 VITALS — BP 120/84 | HR 96 | Temp 98.6°F | Wt 256.5 lb

## 2016-07-20 DIAGNOSIS — Z23 Encounter for immunization: Secondary | ICD-10-CM

## 2016-07-20 DIAGNOSIS — J4541 Moderate persistent asthma with (acute) exacerbation: Secondary | ICD-10-CM | POA: Diagnosis not present

## 2016-07-20 MED ORDER — BUDESONIDE-FORMOTEROL FUMARATE 80-4.5 MCG/ACT IN AERO: INHALATION_SPRAY | RESPIRATORY_TRACT | 11 refills | Status: DC

## 2016-07-20 MED ORDER — ALBUTEROL SULFATE HFA 108 (90 BASE) MCG/ACT IN AERS: 2.0000 | INHALATION_SPRAY | Freq: Four times a day (QID) | RESPIRATORY_TRACT | 2 refills | Status: DC | PRN

## 2016-07-20 NOTE — Patient Instructions (Signed)

## 2016-07-20 NOTE — Progress Notes (Signed)
HPI  Pt presents to the clinic today with c/o cough, wheezing and shortness of breath. She reports this started 1 week ago. The cough is nonproductive. She denies runny nose, nasal congestion, sore throat, or chest pain. She denies fever, chills or body aches. She has tried Primatene OTC without any relief. She has a history of asthma but had been out of her Symbicort for 1 months, because her pulmonologist would not refill it until she makes a follow up appt. She reports she would like to see a different pulmonologist, not in the [facility] system. She has not had sick contacts that she is aware of.  Review of Systems      Past Medical History:  Diagnosis Date  . Asthma   . Bronchitis   . Migraine headache   . Seasonal allergies     Family History  Problem Relation Age of Onset  . Hypertension Mother   . Diverticulitis Mother   . Fibromyalgia Mother   . Cancer Sister     uterine  . Diabetes Maternal Uncle   . Heart disease Maternal Uncle   . Asthma Maternal Grandmother   . Heart disease Maternal Grandmother   . Emphysema Maternal Grandmother   . Colon cancer Neg Hx   . Esophageal cancer Neg Hx   . Rectal cancer Neg Hx   . Stomach cancer Neg Hx   . Emphysema Maternal Grandmother     smoked  . Heart failure Mother     Social History   Social History  . Marital status: Married    Spouse name: N/A  . Number of children: 3  . Years of education: N/A   Occupational History  . Student  Not Employed   Social History Main Topics  . Smoking status: Never Smoker  . Smokeless tobacco: Never Used  . Alcohol use No  . Drug use: No  . Sexual activity: Yes   Other Topics Concern  . 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 Codeine Sulfate     REACTION: Hives, can't breathe  .  Latex Hives     Constitutional: Denies headache, fatigue, fever or  abrupt weight changes.  HEENT: Denies eye redness, eye pain, pressure behind the eyes, facial pain, nasal congestion, ear pain, ringing in the ears, wax buildup, runny nose or bloody nose. Respiratory: Positive cough, shortness of breath. Denies difficulty breathing.  Cardiovascular: Denies chest pain, chest tightness, palpitations or swelling in the hands or feet.   No other specific complaints in a complete review of systems (except as listed in HPI above).  Objective:   BP 120/84   Pulse 96   Temp 98.6 F (37 C) (Oral)   Wt 256 lb 8 oz (116.3 kg)   SpO2 97%   BMI 42.68 kg/m  Wt Readings from Last 3 Encounters:  07/20/16 256 lb 8 oz (116.3 kg)  12/23/15 249 lb 8 oz (113.2 kg)  06/09/15 248 lb 12.8 oz (112.9 kg)     General: Appears her stated age, obese in NAD. HEENT: Head: normal shape and size; Ears: Tm's gray and intact, normal light reflex; Throat/Mouth: + PND. Teeth present, mucosa pink and moist, no exudate noted, no lesions or ulcerations noted.  Neck: No cervical lymphadenopathy.  Cardiovascular: Normal rate and  rhythm. S1,S2 noted.  No murmur, rubs or gallops noted.  Pulmonary/Chest: Normal effort and expiratory wheezing in the left middle/lower lobe. No respiratory distress. No rales or ronchi noted.      Assessment & Plan:   Asthma exacerbation:  Get some rest and drink plenty of water Refilled Symbicort eRx for Albuterol prn Flu and pneumovax given today  RTC as needed or if symptoms persist.   Nicki Reaper, NP

## 2016-07-27 ENCOUNTER — Ambulatory Visit (INDEPENDENT_AMBULATORY_CARE_PROVIDER_SITE_OTHER): Payer: BLUE CROSS/BLUE SHIELD | Admitting: Internal Medicine

## 2016-07-27 ENCOUNTER — Encounter: Payer: Self-pay | Admitting: Internal Medicine

## 2016-07-27 VITALS — BP 122/84 | HR 86 | Temp 98.4°F | Ht 64.5 in | Wt 255.5 lb

## 2016-07-27 DIAGNOSIS — Z0000 Encounter for general adult medical examination without abnormal findings: Secondary | ICD-10-CM

## 2016-07-27 DIAGNOSIS — J3089 Other allergic rhinitis: Secondary | ICD-10-CM

## 2016-07-27 DIAGNOSIS — J301 Allergic rhinitis due to pollen: Secondary | ICD-10-CM

## 2016-07-27 DIAGNOSIS — K219 Gastro-esophageal reflux disease without esophagitis: Secondary | ICD-10-CM

## 2016-07-27 DIAGNOSIS — R609 Edema, unspecified: Secondary | ICD-10-CM | POA: Diagnosis not present

## 2016-07-27 DIAGNOSIS — N3946 Mixed incontinence: Secondary | ICD-10-CM | POA: Diagnosis not present

## 2016-07-27 DIAGNOSIS — M7062 Trochanteric bursitis, left hip: Secondary | ICD-10-CM

## 2016-07-27 DIAGNOSIS — N951 Menopausal and female climacteric states: Secondary | ICD-10-CM | POA: Diagnosis not present

## 2016-07-27 DIAGNOSIS — Z114 Encounter for screening for human immunodeficiency virus [HIV]: Secondary | ICD-10-CM | POA: Diagnosis not present

## 2016-07-27 DIAGNOSIS — G43C1 Periodic headache syndromes in child or adult, intractable: Secondary | ICD-10-CM

## 2016-07-27 DIAGNOSIS — Z1159 Encounter for screening for other viral diseases: Secondary | ICD-10-CM

## 2016-07-27 DIAGNOSIS — F411 Generalized anxiety disorder: Secondary | ICD-10-CM

## 2016-07-27 DIAGNOSIS — J4530 Mild persistent asthma, uncomplicated: Secondary | ICD-10-CM

## 2016-07-27 DIAGNOSIS — Z0001 Encounter for general adult medical examination with abnormal findings: Secondary | ICD-10-CM | POA: Diagnosis not present

## 2016-07-27 DIAGNOSIS — R600 Localized edema: Secondary | ICD-10-CM

## 2016-07-27 DIAGNOSIS — Z23 Encounter for immunization: Secondary | ICD-10-CM

## 2016-07-27 LAB — LIPID PANEL
Cholesterol: 182 mg/dL (ref 0–200)
HDL: 36 mg/dL — ABNORMAL LOW (ref >39.00)
LDL CALC: 112 mg/dL — AB (ref 0–99)
NONHDL: 146.17
Total CHOL/HDL Ratio: 5
Triglycerides: 169 mg/dL — ABNORMAL HIGH (ref 0.0–149.0)
VLDL: 33.8 mg/dL (ref 0.0–40.0)

## 2016-07-27 LAB — TSH: TSH: 2.34 u[IU]/mL (ref 0.35–4.50)

## 2016-07-27 LAB — COMPREHENSIVE METABOLIC PANEL
ALBUMIN: 3.7 g/dL (ref 3.5–5.2)
ALT: 20 U/L (ref 0–35)
AST: 13 U/L (ref 0–37)
Alkaline Phosphatase: 57 U/L (ref 39–117)
BILIRUBIN TOTAL: 0.4 mg/dL (ref 0.2–1.2)
BUN: 17 mg/dL (ref 6–23)
CO2: 28 meq/L (ref 19–32)
CREATININE: 0.89 mg/dL (ref 0.40–1.20)
Calcium: 9.2 mg/dL (ref 8.4–10.5)
Chloride: 106 mEq/L (ref 96–112)
GFR: 70.26 mL/min (ref >60.00)
Glucose, Bld: 96 mg/dL (ref 70–99)
Potassium: 4 mEq/L (ref 3.5–5.1)
Sodium: 140 mEq/L (ref 135–145)
Total Protein: 6.9 g/dL (ref 6.0–8.3)

## 2016-07-27 LAB — CBC
HCT: 41.4 % (ref 36.0–46.0)
Hemoglobin: 14 g/dL (ref 12.0–15.0)
MCHC: 33.8 g/dL (ref 30.0–36.0)
MCV: 93 fl (ref 78.0–100.0)
PLATELETS: 254 10*3/uL (ref 150.0–400.0)
RBC: 4.45 Mil/uL (ref 3.87–5.11)
RDW: 13.2 % (ref 11.5–15.5)
WBC: 9.7 10*3/uL (ref 4.0–10.5)

## 2016-07-27 LAB — FOLLICLE STIMULATING HORMONE: FSH: 41.4 m[IU]/mL

## 2016-07-27 LAB — HEMOGLOBIN A1C: HEMOGLOBIN A1C: 5.8 % (ref 4.6–6.5)

## 2016-07-27 LAB — LUTEINIZING HORMONE: LH: 25.29 m[IU]/mL

## 2016-07-27 MED ORDER — OXYBUTYNIN CHLORIDE ER 10 MG PO TB24: 10.0000 mg | ORAL_TABLET | Freq: Every day | ORAL | 11 refills | Status: DC

## 2016-07-27 MED ORDER — MONTELUKAST SODIUM 10 MG PO TABS: 10.0000 mg | ORAL_TABLET | Freq: Every day | ORAL | 11 refills | Status: DC

## 2016-07-27 MED ORDER — SERTRALINE HCL 25 MG PO TABS: 25.0000 mg | ORAL_TABLET | Freq: Every day | ORAL | 11 refills | Status: DC

## 2016-07-27 NOTE — Assessment & Plan Note (Signed)
Continue Symbicort, Singulair and Albuterol She is establishing with a new pulmonologist in [place] next week

## 2016-07-27 NOTE — Assessment & Plan Note (Signed)
Mild Try to identify triggers and avoid them Continue Ibuprofen prn

## 2016-07-27 NOTE — Progress Notes (Signed)
Subjective:    Patient ID: Jamie Rowland, female    DOB: 08/23/1961, 55 y.o.   MRN: [id number]  HPI  Pt presents to the clinic today for her annual exam. She requested to transfer her care from Dr. Dayton MartesAron to me 02/2015, but we never had an establish care appt or opportunity to go over any of her chronic conditions, so she will be due for follow up today.  Allergies: Worse in the spring and fall. She takes Singulair as prescribed.  Asthma: Mild, persistent. She takes Symbicort and Singulair as prescribed. She has an Albuterol inhaler that she uses only if the weather changes or if she gets sick. She follows with Dr. Sherene SiresWert.  Anxiety: Worse lately as she is about to start a new job. Triggered by everyday stress. She used to take Zoloft but was able to come off it for about 4 years. She thinks that she needs to restart the Zoloft at this time.  GERD: Not sure what triggers this. She takes Pepcid twice daily with good relief.  Migraines: These occur about 1-2/month. The are located in the temples. She describes the pain as throbbing. She reports associated sensitivity to light. She denies sensitivity to sound, nausea or vomiting. She takes Ibuprofen and lays in a dark room with good relief.  Mixed Incontinence: She takes Ditropan as prescribed.  Flu: 07/2016 Tetanus: 01/2006 Pap Smear: 02/2015 at [facility] Colon Screening: 11/2013, repeat in 5 years Vision Screening: yearly  Dentist: biannually  Diet: She does eat meat. She consumes fruits and veggies daily. She tries to avoid fried foods. She drinks mostly water, occasional soda. Exercise: She started going to a gym, going 2 x week.  Review of Systems      Past Medical History:  Diagnosis Date  . Asthma   . Bronchitis   . Migraine headache   . Seasonal allergies     Current Outpatient Prescriptions  Medication Sig Dispense Refill  . albuterol (PROVENTIL HFA;VENTOLIN HFA) 108 (90 Base)  MCG/ACT inhaler Inhale 2 puffs into the lungs every 6 (six) hours as needed for wheezing or shortness of breath. 1 Inhaler 2  . budesonide-formoterol (SYMBICORT) 80-4.5 MCG/ACT inhaler Take 2 puffs first thing in am and then another 2 puffs about 12 hours later. 1 Inhaler 11  . famotidine (PEPCID) 20 MG tablet Take 1 tablet (20 mg total) by mouth 2 (two) times daily. One at bedtime    . montelukast (SINGULAIR) 10 MG tablet Take 1 tablet (10 mg total) by mouth at bedtime. OFFICE VISIT REQUIRED FOR ADDITIONAL REFILLS 30 tablet 0  . oxybutynin (DITROPAN-XL) 10 MG 24 hr tablet Take 1 tablet (10 mg total) by mouth 2 (two) times daily. OFFICE VISIT REQUIRED FOR ADDITIONAL REFILLS 60 tablet 0   No current facility-administered medications for this visit.     Allergies  Allergen Reactions  . Codeine Sulfate     REACTION: Hives, can't breathe  . Latex Hives    Family History  Problem Relation Age of Onset  . Hypertension Mother   . Diverticulitis Mother   . Fibromyalgia Mother   . Heart failure Mother   . Cancer Sister     uterine  . Diabetes Maternal Uncle   . Heart disease Maternal Uncle   . Asthma Maternal Grandmother   . Heart disease Maternal Grandmother   . Emphysema Maternal Grandmother     smoked  . Colon cancer Neg Hx   .  Esophageal cancer Neg Hx   . Rectal cancer Neg Hx   . Stomach cancer Neg Hx     Social History   Social History  . Marital status: Married    Spouse name: N/A  . Number of children: 3  . Years of education: N/A   Occupational History  . Student  Not Employed   Social History Main Topics  . Smoking status: Never Smoker  . Smokeless tobacco: Never Used  . Alcohol use No  . Drug use: No  . Sexual activity: Yes   Other Topics Concern  . 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 Denies fever, malaise, fatigue, headache or abrupt weight changes.  HEENT: Denies eye pain, eye redness, ear pain, ringing in the ears, wax buildup, runny nose, nasal congestion, bloody nose, or sore throat. Respiratory: Denies difficulty breathing, shortness of breath, cough or sputum production.   Cardiovascular: Pt reports swelling in her legs. Denies chest pain, chest tightness, palpitations or swelling in the hands.  Gastrointestinal: Denies abdominal pain, bloating, constipation, diarrhea or blood in the stool.  GU: Pt reports urinary incontinence. Denies urgency, frequency, pain with urination, burning sensation, blood in urine, odor or discharge. Musculoskeletal: Pt reports left hip pain. Denies decrease in range of motion, difficulty with gait, muscle pain or joint swelling.  Skin: Denies redness, rashes, lesions or ulcercations.  Neurological: Denies dizziness, difficulty with memory, difficulty with speech or problems with balance and coordination.  Psych: Denies anxiety, depression, SI/HI.  No other specific complaints in a complete review of systems (except as listed in HPI above).  Objective:   Physical Exam   BP 122/84   Pulse 86   Temp 98.4 F (36.9 C) (Oral)   Ht 5' 4.5" (1.638 m)   Wt 255 lb 8 oz (115.9 kg)   SpO2 98%   BMI 43.18 kg/m  Wt Readings from Last 3 Encounters:  07/27/16 255 lb 8 oz (115.9 kg)  07/20/16 256 lb 8 oz (116.3 kg)  12/23/15 249 lb 8 oz (113.2 kg)    General: Appears her stated age, obese in NAD. Skin: Warm, dry and intact.  HEENT: Head: normal shape and size; Eyes: sclera white, no icterus, conjunctiva pink, PERRLA and EOMs intact; Ears: Tm's gray and intact, normal light reflex; Throat/Mouth: Teeth present, mucosa pink and moist, no exudate, lesions or ulcerations noted.  Neck:  Neck supple, trachea midline. No masses, lumps or thyromegaly present.  Cardiovascular: Normal rate and rhythm.  S1,S2 noted.  No murmur, rubs or gallops noted. 1+ pitting BLE edema. No carotid bruits noted. Pulmonary/Chest: Normal effort and positive vesicular breath sounds. No respiratory distress. No wheezes, rales or ronchi noted.  Abdomen: Soft and nontender. Normal bowel sounds. No distention or masses noted. Liver, spleen and kidneys non palpable. Musculoskeletal: Normal flexion, extension, internal rotation, external rotation, abduction and adduction of the left hip. Pain with palpation over the left trochanter. Strength 5/5 BUE/BLE. No difficulty with gait.  Neurological: Alert and oriented. Cranial nerves II-XII grossly intact. Coordination normal.  Psychiatric: Mildly anxious appearing. Behavior is normal. Judgment and thought content normal.     BMET    Component Value Date/Time   NA 135 07/13/2014 1430   K 3.4 (L) 07/13/2014 1430   CL 103  07/13/2014 1430   CO2 25 07/13/2014 1430   GLUCOSE 88 07/13/2014 1430   BUN 14 07/13/2014 1430   CREATININE 0.9 07/13/2014 1430   CALCIUM 8.6 07/13/2014 1430   GFRNONAA 81.49 04/08/2010 0831   GFRAA 77 05/16/2007 0947    Lipid Panel     Component Value Date/Time   CHOL 167 07/13/2014 1430   TRIG 132.0 07/13/2014 1430   HDL 36.60 (L) 07/13/2014 1430   CHOLHDL 5 07/13/2014 1430   VLDL 26.4 07/13/2014 1430   LDLCALC 104 (H) 07/13/2014 1430    CBC    Component Value Date/Time   WBC 10.1 07/13/2014 1430   RBC 4.20 07/13/2014 1430   HGB 13.3 07/13/2014 1430   HCT 39.4 07/13/2014 1430   PLT 234.0 07/13/2014 1430   MCV 94.0 07/13/2014 1430   MCHC 33.8 07/13/2014 1430   RDW 12.9 07/13/2014 1430   LYMPHSABS 3.3 07/04/2013 0827   MONOABS 1.0 07/04/2013 0827   EOSABS 0.5 07/04/2013 0827   BASOSABS 0.1 07/04/2013 0827    Hgb A1C No results found for: HGBA1C         Assessment & Plan:   Preventative Health Maintenance:  Flu shot UTD Tdap today Pap smear UTD Mammogram scheduled Colonoscopy UTD Encouraged her to consume a  balanced diet and exercise regimen Advised her to see an eye doctor and dentist annually Will check CBC, CMET, Lipid, TSH, A1C, HIV, Hep C, FSH and LH today  Left hip pain secondary to left trochanteric bursitis:  Discussed the importance of weight loss Advised her to try Ibuprofen OTC  Peripheral edema:  Encouraged elevation Consume a low fat diet  Unknown menopausal status:  Will chekcFSH/LH today  RTC in 1 year for annual exam Nicki Reaper, NP

## 2016-07-27 NOTE — Assessment & Plan Note (Signed)
Continue Pepcid Try to identify triggers and avoid them Discussed how weight loss could help improve reflux

## 2016-07-27 NOTE — Assessment & Plan Note (Addendum)
Controlled with Singulair, refilled today Will monitor

## 2016-07-27 NOTE — Assessment & Plan Note (Signed)
Deteriorated Will start Zoloft She will update me in 4 week and let me know how she is doing

## 2016-07-27 NOTE — Patient Instructions (Signed)

## 2016-07-27 NOTE — Assessment & Plan Note (Signed)
Discussed the importance of timed voiding Continue Ditropan, refilled today

## 2016-07-28 LAB — HEPATITIS C ANTIBODY: HCV Ab: NEGATIVE

## 2016-07-28 LAB — HIV ANTIBODY (ROUTINE TESTING W REFLEX): HIV 1&2 Ab, 4th Generation: NONREACTIVE

## 2016-07-28 NOTE — Addendum Note (Signed)
Addended by: Roena MaladyEVONTENNO, Britnee Mcdevitt Y on: 07/28/2016 11:41 AM   Modules accepted: Orders

## 2016-07-31 ENCOUNTER — Institutional Professional Consult (permissible substitution): Payer: BLUE CROSS/BLUE SHIELD | Admitting: Pulmonary Disease

## 2016-08-02 ENCOUNTER — Telehealth: Payer: Self-pay | Admitting: Internal Medicine

## 2016-08-02 ENCOUNTER — Encounter: Payer: Self-pay | Admitting: Internal Medicine

## 2016-08-02 NOTE — Telephone Encounter (Signed)
PT came by office wanting lab results from 10/12 visit. I explained they aren't released so I can't print them. She requested a call back with results.

## 2016-08-02 NOTE — Telephone Encounter (Signed)
Pt returned call, stated that she accidentally disconnected the phone. Thanks

## 2016-08-02 NOTE — Addendum Note (Signed)
Addended by: Roena MaladyEVONTENNO, Jhair Witherington Y on: 08/02/2016 11:35 AM   Modules accepted: Orders

## 2016-08-04 NOTE — Telephone Encounter (Signed)
See result note.  

## 2016-08-08 ENCOUNTER — Institutional Professional Consult (permissible substitution): Payer: BLUE CROSS/BLUE SHIELD | Admitting: Internal Medicine

## 2016-08-23 ENCOUNTER — Institutional Professional Consult (permissible substitution): Payer: BLUE CROSS/BLUE SHIELD | Admitting: Internal Medicine

## 2016-08-28 ENCOUNTER — Ambulatory Visit: Payer: BLUE CROSS/BLUE SHIELD | Admitting: Internal Medicine

## 2016-08-31 ENCOUNTER — Institutional Professional Consult (permissible substitution): Payer: BLUE CROSS/BLUE SHIELD | Admitting: Internal Medicine

## 2016-08-31 ENCOUNTER — Encounter: Payer: Self-pay | Admitting: *Deleted

## 2016-09-01 ENCOUNTER — Ambulatory Visit: Payer: BLUE CROSS/BLUE SHIELD | Admitting: Internal Medicine

## 2016-09-04 ENCOUNTER — Ambulatory Visit: Payer: BLUE CROSS/BLUE SHIELD | Admitting: Internal Medicine

## 2017-01-04 ENCOUNTER — Ambulatory Visit: Payer: BLUE CROSS/BLUE SHIELD | Admitting: Internal Medicine

## 2017-01-05 ENCOUNTER — Ambulatory Visit: Payer: BLUE CROSS/BLUE SHIELD | Admitting: Internal Medicine

## 2017-02-05 ENCOUNTER — Ambulatory Visit: Payer: BLUE CROSS/BLUE SHIELD | Admitting: Internal Medicine

## 2017-02-06 ENCOUNTER — Ambulatory Visit (INDEPENDENT_AMBULATORY_CARE_PROVIDER_SITE_OTHER): Payer: BLUE CROSS/BLUE SHIELD | Admitting: Internal Medicine

## 2017-02-06 ENCOUNTER — Encounter: Payer: Self-pay | Admitting: Internal Medicine

## 2017-02-06 VITALS — BP 124/82 | HR 75 | Temp 98.2°F | Wt 249.8 lb

## 2017-02-06 DIAGNOSIS — M25561 Pain in right knee: Secondary | ICD-10-CM

## 2017-02-06 DIAGNOSIS — R946 Abnormal results of thyroid function studies: Secondary | ICD-10-CM | POA: Diagnosis not present

## 2017-02-06 DIAGNOSIS — R7989 Other specified abnormal findings of blood chemistry: Secondary | ICD-10-CM

## 2017-02-06 DIAGNOSIS — M25562 Pain in left knee: Secondary | ICD-10-CM | POA: Diagnosis not present

## 2017-02-06 LAB — T4, FREE: Free T4: 0.71 ng/dL (ref 0.60–1.60)

## 2017-02-06 LAB — TSH: TSH: 3.33 u[IU]/mL (ref 0.35–4.50)

## 2017-02-06 MED ORDER — DICLOFENAC SODIUM 75 MG PO TBEC: 75.0000 mg | DELAYED_RELEASE_TABLET | Freq: Two times a day (BID) | ORAL | 2 refills | Status: DC

## 2017-02-06 NOTE — Progress Notes (Signed)
Subjective:    Patient ID: Jamie Rowland, female    DOB: [birth year], 56 y.o.   MRN: [id number]  HPI  Pt presents to the clinic today requesting her thyroid be checked. She had labs done with her employer. Her TSH was 6.010, 12/2016. She has difficulty losing weight. She denies hair loss, dry skin, fatigue, constipation or depression. She has never been told that she has a thyroid problem in the past.   She also c/o bilateral knee pain. She has knee pain intermittent for years. She attributes this to arthritis. She feels like her pain is worse lately because she just started working again for the first time in 8 years. She denies any joint swelling. She has taken Aleve with minimal relief.   Review of Systems      Past Medical History:  Diagnosis Date  . Asthma   . Bronchitis   . Migraine headache   . Seasonal allergies     Current Outpatient Prescriptions  Medication Sig Dispense Refill  . albuterol (PROVENTIL HFA;VENTOLIN HFA) 108 (90 Base) MCG/ACT inhaler Inhale 2 puffs into the lungs every 6 (six) hours as needed for wheezing or shortness of breath. 1 Inhaler 2  . budesonide-formoterol (SYMBICORT) 80-4.5 MCG/ACT inhaler Take 2 puffs first thing in am and then another 2 puffs about 12 hours later. 1 Inhaler 11  . famotidine (PEPCID) 20 MG tablet Take 1 tablet (20 mg total) by mouth 2 (two) times daily. One at bedtime    . montelukast (SINGULAIR) 10 MG tablet Take 1 tablet (10 mg total) by mouth at bedtime. 30 tablet 11  . oxybutynin (DITROPAN-XL) 10 MG 24 hr tablet Take 1 tablet (10 mg total) by mouth at bedtime. 30 tablet 11  . sertraline (ZOLOFT) 25 MG tablet Take 1 tablet (25 mg total) by mouth daily. 30 tablet 11   No current facility-administered medications for this visit.     Allergies  Allergen Reactions  . Codeine Sulfate     REACTION: Hives, can't breathe  . Latex Hives    Family History  Problem Relation Age of Onset  . Hypertension Mother   . Diverticulitis  Mother   . Fibromyalgia Mother   . Heart failure Mother   . Cancer Sister     uterine  . Diabetes Maternal Uncle   . Heart disease Maternal Uncle   . Asthma Maternal Grandmother   . Heart disease Maternal Grandmother   . Emphysema Maternal Grandmother     smoked  . Colon cancer Neg Hx   . Esophageal cancer Neg Hx   . Rectal cancer Neg Hx   . Stomach cancer Neg Hx     Social History   Social History  . Marital status: Married    Spouse name: N/A  . Number of children: 3  . Years of education: N/A   Occupational History  . Student  Not Employed   Social History Main Topics  . Smoking status: Never Smoker  . Smokeless tobacco: Never Used  . Alcohol use No  . Drug use: No  . Sexual activity: Yes   Other Topics Concern  . 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  Gastrointestinal: Denies abdominal pain, bloating, constipation, diarrhea or blood  in the stool.  Musculoskeletal: Pt reports joint pain. Denies decrease in range of motion, difficulty with gait, muscle pain or joint swelling.  Skin: Denies redness, rashes, lesions or ulcercations.   No other specific complaints in a complete review of systems (except as listed in HPI above).  Objective:   Physical Exam    BP 124/82   Pulse 75   Temp 98.2 F (36.8 C) (Oral)   Wt 249 lb 12 oz (113.3 kg)   SpO2 98%   BMI 42.21 kg/m  Wt Readings from Last 3 Encounters:  02/06/17 249 lb 12 oz (113.3 kg)  07/27/16 255 lb 8 oz (115.9 kg)  07/20/16 256 lb 8 oz (116.3 kg)    General: Appears her stated age, obese in NAD. Neck:  Neck supple, trachea midline. No masses, lumps or thyromegaly present.  Musculoskeletal: Normal flexion and extension noted of the bilateral knees.  No signs of joint swelling. No difficulty with gait.    BMET    Component Value Date/Time   NA 140 07/27/2016 1119   K 4.0 07/27/2016 1119   CL 106 07/27/2016 1119   CO2 28 07/27/2016 1119   GLUCOSE 96 07/27/2016 1119   BUN 17 07/27/2016 1119   CREATININE 0.89 07/27/2016 1119   CALCIUM 9.2 07/27/2016 1119   GFRNONAA 81.49 04/08/2010 0831   GFRAA 77 05/16/2007 0947    Lipid Panel     Component Value Date/Time   CHOL 182 07/27/2016 1119   TRIG 169.0 (H) 07/27/2016 1119   HDL 36.00 (L) 07/27/2016 1119   CHOLHDL 5 07/27/2016 1119   VLDL 33.8 07/27/2016 1119   LDLCALC 112 (H) 07/27/2016 1119    CBC    Component Value Date/Time   WBC 9.7 07/27/2016 1119   RBC 4.45 07/27/2016 1119   HGB 14.0 07/27/2016 1119   HCT 41.4 07/27/2016 1119   PLT 254.0 07/27/2016 1119   MCV 93.0 07/27/2016 1119   MCHC 33.8 07/27/2016 1119   RDW 13.2 07/27/2016 1119   LYMPHSABS 3.3 07/04/2013 0827   MONOABS 1.0 07/04/2013 0827   EOSABS 0.5 07/04/2013 0827   BASOSABS 0.1 07/04/2013 0827    Hgb A1C Lab Results  Component Value Date   HGBA1C 5.8 07/27/2016          Assessment & Plan:   Abnormal TSH:  Will check TSH, Free T4, and T3 today  Bilateral Knee Pain:  Complicated by weight Encouraged weight loss eRx for Voltaren 75 mg daily- advised her to avoid her other NSAID use Can obtain xrays if worse  Will follow up after labs, RTC as needed Nicki Reaper, NP

## 2017-02-06 NOTE — Patient Instructions (Signed)
Hypothyroidism Hypothyroidism is a disorder of the thyroid. The thyroid is a large gland that is located in the lower front of the neck. The thyroid releases hormones that control how the body works. With hypothyroidism, the thyroid does not make enough of these hormones. What are the causes? Causes of hypothyroidism may include:  Viral infections.  Pregnancy.  Your own defense system (immune system) attacking your thyroid.  Certain medicines.  Birth defects.  Past radiation treatments to your head or neck.  Past treatment with radioactive iodine.  Past surgical removal of part or all of your thyroid.  Problems with the gland that is located in the center of your brain (pituitary).  What are the signs or symptoms? Signs and symptoms of hypothyroidism may include:  Feeling as though you have no energy (lethargy).  Inability to tolerate cold.  Weight gain that is not explained by a change in diet or exercise habits.  Dry skin.  Coarse hair.  Menstrual irregularity.  Slowing of thought processes.  Constipation.  Sadness or depression.  How is this diagnosed? Your health care provider may diagnose hypothyroidism with blood tests and ultrasound tests. How is this treated? Hypothyroidism is treated with medicine that replaces the hormones that your body does not make. After you begin treatment, it may take several weeks for symptoms to go away. Follow these instructions at home:  Take medicines only as directed by your health care provider.  If you start taking any new medicines, tell your health care provider.  Keep all follow-up visits as directed by your health care provider. This is important. As your condition improves, your dosage needs may change. You will need to have blood tests regularly so that your health care provider can watch your condition. Contact a health care provider if:  Your symptoms do not get better with treatment.  You are taking thyroid  replacement medicine and: ? You sweat excessively. ? You have tremors. ? You feel anxious. ? You lose weight rapidly. ? You cannot tolerate heat. ? You have emotional swings. ? You have diarrhea. ? You feel weak. Get help right away if:  You develop chest pain.  You develop an irregular heartbeat.  You develop a rapid heartbeat. This information is not intended to replace advice given to you by your health care provider. Make sure you discuss any questions you have with your health care provider. Document Released: 10/02/2005 Document Revised: 03/09/2016 Document Reviewed: 02/17/2014 Elsevier Interactive Patient Education  2017 [employer]  

## 2017-02-07 LAB — T3: T3, Total: 107 ng/dL (ref 76–181)

## 2017-05-28 ENCOUNTER — Other Ambulatory Visit: Payer: Self-pay

## 2017-05-28 MED ORDER — DICLOFENAC SODIUM 75 MG PO TBEC: 75.0000 mg | DELAYED_RELEASE_TABLET | Freq: Two times a day (BID) | ORAL | 2 refills | Status: DC

## 2017-05-28 NOTE — Telephone Encounter (Signed)
Last filled 02/06/17 with 2 refills... Please advise if okay to refill

## 2017-07-04 ENCOUNTER — Ambulatory Visit: Payer: BLUE CROSS/BLUE SHIELD | Admitting: Podiatry

## 2017-07-10 ENCOUNTER — Encounter (INDEPENDENT_AMBULATORY_CARE_PROVIDER_SITE_OTHER): Payer: BLUE CROSS/BLUE SHIELD | Admitting: Podiatry

## 2017-07-10 ENCOUNTER — Ambulatory Visit: Payer: BLUE CROSS/BLUE SHIELD

## 2017-07-10 DIAGNOSIS — M778 Other enthesopathies, not elsewhere classified: Secondary | ICD-10-CM

## 2017-07-10 DIAGNOSIS — M7751 Other enthesopathy of right foot: Secondary | ICD-10-CM

## 2017-07-10 DIAGNOSIS — M779 Enthesopathy, unspecified: Principal | ICD-10-CM

## 2017-07-10 NOTE — Progress Notes (Signed)
This encounter was created in error - please disregard.

## 2017-08-02 ENCOUNTER — Ambulatory Visit (INDEPENDENT_AMBULATORY_CARE_PROVIDER_SITE_OTHER): Payer: BLUE CROSS/BLUE SHIELD | Admitting: Internal Medicine

## 2017-08-02 ENCOUNTER — Encounter: Payer: Self-pay | Admitting: Internal Medicine

## 2017-08-02 VITALS — BP 120/84 | HR 82 | Temp 98.5°F | Ht 64.0 in | Wt 245.0 lb

## 2017-08-02 DIAGNOSIS — G43C1 Periodic headache syndromes in child or adult, intractable: Secondary | ICD-10-CM

## 2017-08-02 DIAGNOSIS — Z0000 Encounter for general adult medical examination without abnormal findings: Secondary | ICD-10-CM

## 2017-08-02 DIAGNOSIS — J4530 Mild persistent asthma, uncomplicated: Secondary | ICD-10-CM

## 2017-08-02 DIAGNOSIS — J302 Other seasonal allergic rhinitis: Secondary | ICD-10-CM

## 2017-08-02 DIAGNOSIS — F411 Generalized anxiety disorder: Secondary | ICD-10-CM | POA: Diagnosis not present

## 2017-08-02 DIAGNOSIS — G8929 Other chronic pain: Secondary | ICD-10-CM

## 2017-08-02 DIAGNOSIS — M25561 Pain in right knee: Secondary | ICD-10-CM

## 2017-08-02 DIAGNOSIS — N3946 Mixed incontinence: Secondary | ICD-10-CM

## 2017-08-02 DIAGNOSIS — K219 Gastro-esophageal reflux disease without esophagitis: Secondary | ICD-10-CM | POA: Diagnosis not present

## 2017-08-02 LAB — CBC
HEMATOCRIT: 44.6 % (ref 36.0–46.0)
Hemoglobin: 14.7 g/dL (ref 12.0–15.0)
MCHC: 33.1 g/dL (ref 30.0–36.0)
MCV: 96.6 fl (ref 78.0–100.0)
Platelets: 300 10*3/uL (ref 150.0–400.0)
RBC: 4.62 Mil/uL (ref 3.87–5.11)
RDW: 13.3 % (ref 11.5–15.5)
WBC: 9.6 10*3/uL (ref 4.0–10.5)

## 2017-08-02 LAB — COMPREHENSIVE METABOLIC PANEL
ALK PHOS: 70 U/L (ref 39–117)
ALT: 26 U/L (ref 0–35)
AST: 16 U/L (ref 0–37)
Albumin: 4.2 g/dL (ref 3.5–5.2)
BUN: 21 mg/dL (ref 6–23)
CO2: 30 meq/L (ref 19–32)
Calcium: 9.8 mg/dL (ref 8.4–10.5)
Chloride: 104 mEq/L (ref 96–112)
Creatinine, Ser: 0.84 mg/dL (ref 0.40–1.20)
GFR: 74.83 mL/min (ref >60.00)
GLUCOSE: 91 mg/dL (ref 70–99)
POTASSIUM: 5 meq/L (ref 3.5–5.1)
Sodium: 141 mEq/L (ref 135–145)
Total Bilirubin: 0.3 mg/dL (ref 0.2–1.2)
Total Protein: 7.8 g/dL (ref 6.0–8.3)

## 2017-08-02 LAB — HEMOGLOBIN A1C: Hgb A1c MFr Bld: 5.6 % (ref 4.6–6.5)

## 2017-08-02 LAB — LIPID PANEL
CHOL/HDL RATIO: 5
Cholesterol: 195 mg/dL (ref 0–200)
HDL: 41.4 mg/dL (ref >39.00)
LDL Cholesterol: 124 mg/dL — ABNORMAL HIGH (ref 0–99)
NONHDL: 154.01
Triglycerides: 150 mg/dL — ABNORMAL HIGH (ref 0.0–149.0)
VLDL: 30 mg/dL (ref 0.0–40.0)

## 2017-08-02 LAB — VITAMIN D 25 HYDROXY (VIT D DEFICIENCY, FRACTURES): VITD: 26.56 ng/mL — ABNORMAL LOW (ref 30.00–100.00)

## 2017-08-02 MED ORDER — SUMATRIPTAN SUCCINATE 25 MG PO TABS: 25.0000 mg | ORAL_TABLET | ORAL | 5 refills | Status: DC | PRN

## 2017-08-02 MED ORDER — DICLOFENAC SODIUM 75 MG PO TBEC: 75.0000 mg | DELAYED_RELEASE_TABLET | Freq: Two times a day (BID) | ORAL | 11 refills | Status: DC

## 2017-08-02 MED ORDER — ALBUTEROL SULFATE HFA 108 (90 BASE) MCG/ACT IN AERS: 2.0000 | INHALATION_SPRAY | Freq: Four times a day (QID) | RESPIRATORY_TRACT | 2 refills | Status: DC | PRN

## 2017-08-02 MED ORDER — OXYBUTYNIN CHLORIDE ER 15 MG PO TB24: 15.0000 mg | ORAL_TABLET | Freq: Every day | ORAL | 11 refills | Status: DC

## 2017-08-02 NOTE — Assessment & Plan Note (Signed)
Controlled on Singulair Will monitor

## 2017-08-02 NOTE — Patient Instructions (Signed)
Health Maintenance for Postmenopausal Women Menopause is a normal process in which your reproductive ability comes to an end. This process happens gradually over a span of months to years, usually between the ages of 22 and 9. Menopause is complete when you have missed 12 consecutive menstrual periods. It is important to talk with your health care provider about some of the most common conditions that affect postmenopausal women, such as heart disease, cancer, and bone loss (osteoporosis). Adopting a healthy lifestyle and getting preventive care can help to promote your health and wellness. Those actions can also lower your chances of developing some of these common conditions. What should I know about menopause? During menopause, you may experience a number of symptoms, such as:  Moderate-to-severe hot flashes.  Night sweats.  Decrease in sex drive.  Mood swings.  Headaches.  Tiredness.  Irritability.  Memory problems.  Insomnia.  Choosing to treat or not to treat menopausal changes is an individual decision that you make with your health care provider. What should I know about hormone replacement therapy and supplements? Hormone therapy products are effective for treating symptoms that are associated with menopause, such as hot flashes and night sweats. Hormone replacement carries certain risks, especially as you become older. If you are thinking about using estrogen or estrogen with progestin treatments, discuss the benefits and risks with your health care provider. What should I know about heart disease and stroke? Heart disease, heart attack, and stroke become more likely as you age. This may be due, in part, to the hormonal changes that your body experiences during menopause. These can affect how your body processes dietary fats, triglycerides, and cholesterol. Heart attack and stroke are both medical emergencies. There are many things that you can do to help prevent heart disease  and stroke:  Have your blood pressure checked at least every 1-2 years. High blood pressure causes heart disease and increases the risk of stroke.  If you are 53-22 years old, ask your health care provider if you should take aspirin to prevent a heart attack or a stroke.  Do not use any tobacco products, including cigarettes, chewing tobacco, or electronic cigarettes. If you need help quitting, ask your health care provider.  It is important to eat a healthy diet and maintain a healthy weight. ? Be sure to include plenty of vegetables, fruits, low-fat dairy products, and lean protein. ? Avoid eating foods that are high in solid fats, added sugars, or salt (sodium).  Get regular exercise. This is one of the most important things that you can do for your health. ? Try to exercise for at least 150 minutes each week. The type of exercise that you do should increase your heart rate and make you sweat. This is known as moderate-intensity exercise. ? Try to do strengthening exercises at least twice each week. Do these in addition to the moderate-intensity exercise.  Know your numbers.Ask your health care provider to check your cholesterol and your blood glucose. Continue to have your blood tested as directed by your health care provider.  What should I know about cancer screening? There are several types of cancer. Take the following steps to reduce your risk and to catch any cancer development as early as possible. Breast Cancer  Practice breast self-awareness. ? This means understanding how your breasts normally appear and feel. ? It also means doing regular breast self-exams. Let your health care provider know about any changes, no matter how small.  If you are 40  or older, have a clinician do a breast exam (clinical breast exam or CBE) every year. Depending on your age, family history, and medical history, it may be recommended that you also have a yearly breast X-ray (mammogram).  If you  have a family history of breast cancer, talk with your health care provider about genetic screening.  If you are at high risk for breast cancer, talk with your health care provider about having an MRI and a mammogram every year.  Breast cancer (BRCA) gene test is recommended for women who have family members with BRCA-related cancers. Results of the assessment will determine the need for genetic counseling and BRCA1 and for BRCA2 testing. BRCA-related cancers include these types: ? Breast. This occurs in males or females. ? Ovarian. ? Tubal. This may also be called fallopian tube cancer. ? Cancer of the abdominal or pelvic lining (peritoneal cancer). ? Prostate. ? Pancreatic.  Cervical, Uterine, and Ovarian Cancer Your health care provider may recommend that you be screened regularly for cancer of the pelvic organs. These include your ovaries, uterus, and vagina. This screening involves a pelvic exam, which includes checking for microscopic changes to the surface of your cervix (Pap test).  For women ages 21-65, health care providers may recommend a pelvic exam and a Pap test every three years. For women ages 79-65, they may recommend the Pap test and pelvic exam, combined with testing for human papilloma virus (HPV), every five years. Some types of HPV increase your risk of cervical cancer. Testing for HPV may also be done on women of any age who have unclear Pap test results.  Other health care providers may not recommend any screening for nonpregnant women who are considered low risk for pelvic cancer and have no symptoms. Ask your health care provider if a screening pelvic exam is right for you.  If you have had past treatment for cervical cancer or a condition that could lead to cancer, you need Pap tests and screening for cancer for at least 20 years after your treatment. If Pap tests have been discontinued for you, your risk factors (such as having a new sexual partner) need to be  reassessed to determine if you should start having screenings again. Some women have medical problems that increase the chance of getting cervical cancer. In these cases, your health care provider may recommend that you have screening and Pap tests more often.  If you have a family history of uterine cancer or ovarian cancer, talk with your health care provider about genetic screening.  If you have vaginal bleeding after reaching menopause, tell your health care provider.  There are currently no reliable tests available to screen for ovarian cancer.  Lung Cancer Lung cancer screening is recommended for adults 69-62 years old who are at high risk for lung cancer because of a history of smoking. A yearly low-dose CT scan of the lungs is recommended if you:  Currently smoke.  Have a history of at least 30 pack-years of smoking and you currently smoke or have quit within the past 15 years. A pack-year is smoking an average of one pack of cigarettes per day for one year.  Yearly screening should:  Continue until it has been 15 years since you quit.  Stop if you develop a health problem that would prevent you from having lung cancer treatment.  Colorectal Cancer  This type of cancer can be detected and can often be prevented.  Routine colorectal cancer screening usually begins at  age 42 and continues through age 45.  If you have risk factors for colon cancer, your health care provider may recommend that you be screened at an earlier age.  If you have a family history of colorectal cancer, talk with your health care provider about genetic screening.  Your health care provider may also recommend using home test kits to check for hidden blood in your stool.  A small camera at the end of a tube can be used to examine your colon directly (sigmoidoscopy or colonoscopy). This is done to check for the earliest forms of colorectal cancer.  Direct examination of the colon should be repeated every  5-10 years until age 71. However, if early forms of precancerous polyps or small growths are found or if you have a family history or genetic risk for colorectal cancer, you may need to be screened more often.  Skin Cancer  Check your skin from head to toe regularly.  Monitor any moles. Be sure to tell your health care provider: ? About any new moles or changes in moles, especially if there is a change in a mole's shape or color. ? If you have a mole that is larger than the size of a pencil eraser.  If any of your family members has a history of skin cancer, especially at a young age, talk with your health care provider about genetic screening.  Always use sunscreen. Apply sunscreen liberally and repeatedly throughout the day.  Whenever you are outside, protect yourself by wearing long sleeves, pants, a wide-brimmed hat, and sunglasses.  What should I know about osteoporosis? Osteoporosis is a condition in which bone destruction happens more quickly than new bone creation. After menopause, you may be at an increased risk for osteoporosis. To help prevent osteoporosis or the bone fractures that can happen because of osteoporosis, the following is recommended:  If you are 46-71 years old, get at least 1,000 mg of calcium and at least 600 mg of vitamin D per day.  If you are older than age 55 but younger than age 65, get at least 1,200 mg of calcium and at least 600 mg of vitamin D per day.  If you are older than age 54, get at least 1,200 mg of calcium and at least 800 mg of vitamin D per day.  Smoking and excessive alcohol intake increase the risk of osteoporosis. Eat foods that are rich in calcium and vitamin D, and do weight-bearing exercises several times each week as directed by your health care provider. What should I know about how menopause affects my mental health? Depression may occur at any age, but it is more common as you become older. Common symptoms of depression  include:  Low or sad mood.  Changes in sleep patterns.  Changes in appetite or eating patterns.  Feeling an overall lack of motivation or enjoyment of activities that you previously enjoyed.  Frequent crying spells.  Talk with your health care provider if you think that you are experiencing depression. What should I know about immunizations? It is important that you get and maintain your immunizations. These include:  Tetanus, diphtheria, and pertussis (Tdap) booster vaccine.  Influenza every year before the flu season begins.  Pneumonia vaccine.  Shingles vaccine.  Your health care provider may also recommend other immunizations. This information is not intended to replace advice given to you by your health care provider. Make sure you discuss any questions you have with your health care provider. Document Released: 11/24/2005  Document Revised: 04/21/2016 Document Reviewed: 07/06/2015 Elsevier Interactive Patient Education  2018 Elsevier Inc.  

## 2017-08-02 NOTE — Assessment & Plan Note (Signed)
Chronic but stable on Zoloft Will monitor

## 2017-08-02 NOTE — Assessment & Plan Note (Signed)
Stable on Symbicort Albuterol refilled today

## 2017-08-02 NOTE — Progress Notes (Signed)
Subjective:    Patient ID: Jamie Rowland, female    DOB: 01/06/1961, 56 y.o.   MRN: [id number]  HPI  Pt presents to the clinic today for her annual exam. She is also due to follow up chronic conditions.  Asthma: Moderate persistent. Controlled on Symbicort. She is out of her Albuterol and is requesting a refill of this today.  Seasonal Allergies: Worse in the spring and fall. She takes Singulair daily as prescribed.   Migraines: These are occurring 1-2 times a week. Theses are triggered by smells and changes in the weahter. She is taking Ibuprofen with minimal relief. She would like a RX for Imitrex today.  GERD: Triggered by everything she eats. She takes Pepcid daily with good relief.  Anxiety: She feels like her symptoms are well controlled on Zoloft. She denies depression, SI/HI.  Mixed Incontinence: She reports some improvement with Ditropan but does not feel if it works as well as it used to.   Flu: 07/2016 Tetanus: 07/2016 Pneumovax: 07/2016 Pap Smear: 07/2016 Mammogram: 03/2011, she will schedule next week Colon Screening: 11/2013 Vision Screening: annually Dentist: as needed  Diet: She does eat lean meat. She consumes fruits and veggies daily. She occasionally eats fried foods. She drinks mostly water. Exercise: She is trying to walk 2-3 days per week, but complicated secondary to knee pain.  Review of Systems      Past Medical History:  Diagnosis Date  . Asthma   . Bronchitis   . Migraine headache   . Seasonal allergies     Current Outpatient Prescriptions  Medication Sig Dispense Refill  . albuterol (PROVENTIL HFA;VENTOLIN HFA) 108 (90 Base) MCG/ACT inhaler Inhale 2 puffs into the lungs every 6 (six) hours as needed for wheezing or shortness of breath. 1 Inhaler 2  . budesonide-formoterol (SYMBICORT) 80-4.5 MCG/ACT inhaler Take 2 puffs first thing in am and then another 2 puffs about 12 hours later. 1 Inhaler 11  . diclofenac (VOLTAREN) 75 MG EC tablet Take  1 tablet (75 mg total) by mouth 2 (two) times daily. 30 tablet 2  . Diethylpropion HCl CR 75 MG TB24 Take 1 tablet by mouth daily.    . famotidine (PEPCID) 20 MG tablet Take 1 tablet (20 mg total) by mouth 2 (two) times daily. One at bedtime    . montelukast (SINGULAIR) 10 MG tablet Take 1 tablet (10 mg total) by mouth at bedtime. 30 tablet 11  . oxybutynin (DITROPAN-XL) 10 MG 24 hr tablet Take 1 tablet (10 mg total) by mouth at bedtime. 30 tablet 11  . sertraline (ZOLOFT) 25 MG tablet Take 1 tablet (25 mg total) by mouth daily. 30 tablet 11   No current facility-administered medications for this visit.     Allergies  Allergen Reactions  . Codeine Sulfate     REACTION: Hives, can't breathe  . Latex Hives    Family History  Problem Relation Age of Onset  . Hypertension Mother   . Diverticulitis Mother   . Fibromyalgia Mother   . Heart failure Mother   . Cancer Sister        uterine  . Diabetes Maternal Uncle   . Heart disease Maternal Uncle   . Asthma Maternal Grandmother   . Heart disease Maternal Grandmother   . Emphysema Maternal Grandmother        smoked  . Colon cancer Neg Hx   . Esophageal cancer Neg Hx   . Rectal cancer Neg Hx   . Stomach cancer  Neg Hx     Social History   Social History  . Marital status: Married    Spouse name: N/A  . Number of children: 3  . Years of education: N/A   Occupational History  . Student  Not Employed   Social History Main Topics  . Smoking status: Never Smoker  . Smokeless tobacco: Never Used  . Alcohol use No  . Drug use: No  . Sexual activity: Yes   Other Topics Concern  . 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 Denies fever, malaise, fatigue, or abrupt weight changes.  HEENT: Denies eye pain, eye redness, ear  pain, ringing in the ears, wax buildup, runny nose, nasal congestion, bloody nose, or sore throat. Respiratory: Denies difficulty breathing, shortness of breath, cough or sputum production.   Cardiovascular: Denies chest pain, chest tightness, palpitations or swelling in the hands or feet.  Gastrointestinal: Denies abdominal pain, bloating, constipation, diarrhea or blood in the stool.  GU: Pt reports urinary incontinence. Denies urgency, frequency, pain with urination, burning sensation, blood in urine, odor or discharge. Musculoskeletal: Pt reports bilateral knee pain, R>L. Denies decrease in range of motion, difficulty with gait, muscle pain or joint swelling.  Skin: Denies redness, rashes, lesions or ulcercations.  Neurological: Denies dizziness, difficulty with memory, difficulty with speech or problems with balance and coordination.  Psych: Pt has a history of anxiety. Denies depression, SI/HI.  No other specific complaints in a complete review of systems (except as listed in HPI above).  Objective:   Physical Exam  BP 120/84   Pulse 82   Temp 98.5 F (36.9 C) (Oral)   Ht 5\' 4"  (1.626 m)   Wt 245 lb (111.1 kg)   SpO2 98%   BMI 42.05 kg/m  Wt Readings from Last 3 Encounters:  08/02/17 245 lb (111.1 kg)  02/06/17 249 lb 12 oz (113.3 kg)  07/27/16 255 lb 8 oz (115.9 kg)    General: Appears her stated age, obese in NAD. Skin: Warm, dry and intact. No rashes, lesions or ulcerations noted. HEENT: Head: normal shape and size; Eyes: sclera white, no icterus, conjunctiva pink, PERRLA and EOMs intact; Ears: Tm's gray and intact, normal light reflex;  Throat/Mouth: Teeth present, mucosa pink and moist, no exudate, lesions or ulcerations noted.  Neck:  Neck supple, trachea midline. No masses, lumps or thyromegaly present.  Cardiovascular: Normal rate and rhythm. S1,S2 noted.  No murmur, rubs or gallops noted. No JVD or BLE edema. No carotid bruits noted. Pulmonary/Chest: Normal effort  and positive vesicular breath sounds. No respiratory distress. No wheezes, rales or ronchi noted.  Abdomen: Soft and nontender. Normal bowel sounds. No distention or masses noted. Liver, spleen and kidneys non palpable. Musculoskeletal: Normal flexion and extension of bilateral knees. Crepitus noted with ROM of right knee. Pain with palpation over the medial joint line, right knee. No signs of joint swelling. No difficulty with gait.  Neurological: Alert and oriented. Cranial nerves II-XII grossly intact. Coordination normal.  Psychiatric: Mood and affect normal. Behavior is normal. Judgment and thought content normal.     BMET    Component Value Date/Time   NA 140 07/27/2016 1119   K 4.0 07/27/2016 1119   CL 106 07/27/2016 1119   CO2 28 07/27/2016 1119  GLUCOSE 96 07/27/2016 1119   BUN 17 07/27/2016 1119   CREATININE 0.89 07/27/2016 1119   CALCIUM 9.2 07/27/2016 1119   GFRNONAA 81.49 04/08/2010 0831   GFRAA 77 05/16/2007 0947    Lipid Panel     Component Value Date/Time   CHOL 182 07/27/2016 1119   TRIG 169.0 (H) 07/27/2016 1119   HDL 36.00 (L) 07/27/2016 1119   CHOLHDL 5 07/27/2016 1119   VLDL 33.8 07/27/2016 1119   LDLCALC 112 (H) 07/27/2016 1119    CBC    Component Value Date/Time   WBC 9.7 07/27/2016 1119   RBC 4.45 07/27/2016 1119   HGB 14.0 07/27/2016 1119   HCT 41.4 07/27/2016 1119   PLT 254.0 07/27/2016 1119   MCV 93.0 07/27/2016 1119   MCHC 33.8 07/27/2016 1119   RDW 13.2 07/27/2016 1119   LYMPHSABS 3.3 07/04/2013 0827   MONOABS 1.0 07/04/2013 0827   EOSABS 0.5 07/04/2013 0827   BASOSABS 0.1 07/04/2013 0827    Hgb A1C Lab Results  Component Value Date   HGBA1C 5.8 07/27/2016            Assessment & Plan:   Preventative Health Maintenance:  Flu shot today Tetanus and pneumovax UTD Pap smear UTD She will schedule her mammogram Colon Screening UTD Encouraged her to consume a balanced diet and exercise regimen Advised her to see an eye  doctor and dentist annually Will check CBC, CMET, Lipid, A1C and Vit D today  Right Knee Pain:  Likely OA Discussed how weight loss could help improve her joint pain Voltaren refilled today Encouraged her to stay active  RTC in 1 year, sooner if needed Nicki ReaperBAITY, Kailani Brass, NP

## 2017-08-02 NOTE — Assessment & Plan Note (Signed)
Deteriorated eRx for Imitrex 25 mg as needed Try to avoid migraine triggers if you can

## 2017-08-02 NOTE — Assessment & Plan Note (Signed)
Discussed how weight loss may help improve her reflux symptoms Continue Pepcid for now Try to identify triggers and avoid them

## 2017-08-02 NOTE — Assessment & Plan Note (Signed)
Increase Ditropan to 15 mg daily If no improvement, consider discussing treatment options with your GYN

## 2017-08-23 ENCOUNTER — Other Ambulatory Visit: Payer: Self-pay | Admitting: Internal Medicine

## 2017-08-23 DIAGNOSIS — J4541 Moderate persistent asthma with (acute) exacerbation: Secondary | ICD-10-CM

## 2017-09-13 ENCOUNTER — Other Ambulatory Visit: Payer: Self-pay | Admitting: Internal Medicine

## 2017-09-13 DIAGNOSIS — J3089 Other allergic rhinitis: Secondary | ICD-10-CM

## 2017-09-13 DIAGNOSIS — F411 Generalized anxiety disorder: Secondary | ICD-10-CM

## 2017-09-13 NOTE — Telephone Encounter (Signed)
Copied from CRM [phone]. Topic: Quick Communication - Rx Refill/Question >> Sep 13, 2017 11:45 AM Darletta MollLander, Lumin L wrote: Has the patient contacted their pharmacy? Yes.   (Agent: If no, request that the patient contact the pharmacy for the refill.) Preferred Pharmacy (with phone number or street name): [facility] [place] [phone] Agent: Please be advised that RX refills may take up to 48 hours. We ask that you follow-up with your pharmacy.

## 2017-09-19 ENCOUNTER — Telehealth: Payer: Self-pay

## 2017-09-19 ENCOUNTER — Ambulatory Visit (INDEPENDENT_AMBULATORY_CARE_PROVIDER_SITE_OTHER)
Admission: RE | Admit: 2017-09-19 | Discharge: 2017-09-19 | Disposition: A | Discharge status: Home / Self Care | Payer: BLUE CROSS/BLUE SHIELD | Source: Ambulatory Visit | Attending: Internal Medicine | Admitting: Internal Medicine

## 2017-09-19 DIAGNOSIS — G8929 Other chronic pain: Secondary | ICD-10-CM

## 2017-09-19 DIAGNOSIS — M179 Osteoarthritis of knee, unspecified: Secondary | ICD-10-CM | POA: Diagnosis not present

## 2017-09-19 DIAGNOSIS — M25561 Pain in right knee: Secondary | ICD-10-CM

## 2017-09-19 NOTE — Telephone Encounter (Signed)
Copied from CRM [phone]. Topic: Inquiry >> Sep 19, 2017  4:25 PM Landry MellowFoltz, Melissa J wrote: Reason for CRM: pt would like to have call when her xrays are available. Please call [phone]

## 2017-09-20 NOTE — Telephone Encounter (Signed)
She will receive a call once her results are available.

## 2017-09-21 NOTE — Telephone Encounter (Signed)
Pt calling again to get her xray results. Pt states she sees they are resulted on MyChart.

## 2017-09-25 ENCOUNTER — Other Ambulatory Visit: Payer: Self-pay | Admitting: Internal Medicine

## 2017-09-25 DIAGNOSIS — G8929 Other chronic pain: Secondary | ICD-10-CM

## 2017-09-25 DIAGNOSIS — M25561 Pain in right knee: Principal | ICD-10-CM

## 2017-10-25 DIAGNOSIS — M1711 Unilateral primary osteoarthritis, right knee: Secondary | ICD-10-CM | POA: Diagnosis not present

## 2018-01-30 ENCOUNTER — Other Ambulatory Visit: Payer: Self-pay | Admitting: Internal Medicine

## 2018-01-30 DIAGNOSIS — F411 Generalized anxiety disorder: Secondary | ICD-10-CM

## 2018-02-27 ENCOUNTER — Other Ambulatory Visit: Payer: Self-pay | Admitting: Internal Medicine

## 2018-02-27 DIAGNOSIS — J3089 Other allergic rhinitis: Secondary | ICD-10-CM

## 2018-02-28 NOTE — Telephone Encounter (Signed)
Imitrex last filled 07/2017 with 5 refills... Please advise

## 2018-04-24 ENCOUNTER — Other Ambulatory Visit: Payer: Self-pay | Admitting: Internal Medicine

## 2018-04-24 DIAGNOSIS — J4541 Moderate persistent asthma with (acute) exacerbation: Secondary | ICD-10-CM

## 2018-04-24 NOTE — Telephone Encounter (Signed)
Rx sent through e-scribe  

## 2018-04-24 NOTE — Telephone Encounter (Signed)
[place] and she is completely out . Please advise. Thank you

## 2018-08-03 ENCOUNTER — Other Ambulatory Visit: Payer: Self-pay | Admitting: Internal Medicine

## 2018-08-03 DIAGNOSIS — J4541 Moderate persistent asthma with (acute) exacerbation: Secondary | ICD-10-CM

## 2018-08-14 ENCOUNTER — Encounter: Payer: BLUE CROSS/BLUE SHIELD | Admitting: Internal Medicine

## 2018-08-15 ENCOUNTER — Other Ambulatory Visit: Payer: Self-pay | Admitting: Internal Medicine

## 2018-08-15 NOTE — Telephone Encounter (Signed)
Imitrex last filled 02/2018 w/ 5 refills... Pt has upcoming OV scheduled please advis

## 2018-08-20 ENCOUNTER — Ambulatory Visit (INDEPENDENT_AMBULATORY_CARE_PROVIDER_SITE_OTHER)
Admission: RE | Admit: 2018-08-20 | Discharge: 2018-08-20 | Disposition: A | Discharge status: Home / Self Care | Payer: BLUE CROSS/BLUE SHIELD | Source: Ambulatory Visit | Attending: Internal Medicine | Admitting: Internal Medicine

## 2018-08-20 ENCOUNTER — Ambulatory Visit: Payer: BLUE CROSS/BLUE SHIELD | Admitting: Internal Medicine

## 2018-08-20 ENCOUNTER — Encounter: Payer: Self-pay | Admitting: Internal Medicine

## 2018-08-20 VITALS — BP 122/84 | HR 94 | Temp 99.4°F | Wt 265.0 lb

## 2018-08-20 DIAGNOSIS — M170 Bilateral primary osteoarthritis of knee: Secondary | ICD-10-CM

## 2018-08-20 DIAGNOSIS — G8929 Other chronic pain: Secondary | ICD-10-CM

## 2018-08-20 DIAGNOSIS — M79671 Pain in right foot: Secondary | ICD-10-CM

## 2018-08-20 DIAGNOSIS — M25562 Pain in left knee: Secondary | ICD-10-CM

## 2018-08-20 DIAGNOSIS — S9031XA Contusion of right foot, initial encounter: Secondary | ICD-10-CM | POA: Diagnosis not present

## 2018-08-20 DIAGNOSIS — M25561 Pain in right knee: Secondary | ICD-10-CM | POA: Diagnosis not present

## 2018-08-20 MED ORDER — DICLOFENAC SODIUM 75 MG PO TBEC: 75.0000 mg | DELAYED_RELEASE_TABLET | Freq: Two times a day (BID) | ORAL | 2 refills | Status: DC

## 2018-08-20 NOTE — Progress Notes (Signed)
Subjective:    Patient ID: Jamie Rowland, female    DOB: [birth year], 57 y.o.   MRN: [id number]  HPI  Patient presents to the clinic today with complaint of bilateral knee pain.  She reports this is been going on for years.  She describes the pain as achy, cramping and throbbing.  She does report intermittent joint swelling.  Her symptoms seem worse with prolonged sitting.  She denies any previous injuries to her knee.  She did have an arthroscopic surgery to her right knee in 2012 for torn meniscus.  She was taking Diclofenac which seemed to help, but reports she ran out so she has been taking ibuprofen OTC. Xray of the right knee from 09/2017 showed:  IMPRESSION: Osteoarthritic change, most marked in the patellofemoral joint but also present medially and to a lesser extent laterally with spurring in all compartments. No fracture or joint effusion. Mild chondrocalcinosis present, a finding that may be seen with osteoarthritis or with calcium pyrophosphate deposition disease.  She did follow up with ortho and she reports the gave her an injection which helped temporarily. She reports they advised her that she would need a knee replacement. She declines surgery at this time.   She also reports bruising of her right foot. She noticed this a few days ago. She denies pain, swelling, numbness, tingling of the right foot. She denies any injury to the area. She has not tried anything OTC for this.  Review of Systems      Past Medical History:  Diagnosis Date  . Asthma   . Bronchitis   . Migraine headache   . Seasonal allergies     Current Outpatient Medications  Medication Sig Dispense Refill  . albuterol (PROVENTIL HFA;VENTOLIN HFA) 108 (90 Base) MCG/ACT inhaler Inhale 2 puffs into the lungs every 6 (six) hours as needed for wheezing or shortness of breath. 1 Inhaler 2  . budesonide-formoterol (SYMBICORT) 80-4.5 MCG/ACT inhaler INHALE 2 PUFFS EVERY 12 HOURS TO PREVENT COUGH/WHEEZE.  RINSE, GARGLE, AND SPIT AFTER USE. 10.2 g 0  . famotidine (PEPCID) 20 MG tablet Take 1 tablet (20 mg total) by mouth 2 (two) times daily. One at bedtime    . montelukast (SINGULAIR) 10 MG tablet TAKE 1 TABLET BY MOUTH EVERY NIGHT AT BEDTIME. 30 tablet 3  . oxybutynin (DITROPAN XL) 15 MG 24 hr tablet TAKE 1 TABLET BY MOUTH AT BEDTIME. 30 tablet 0  . sertraline (ZOLOFT) 25 MG tablet TAKE 1 TABLET BY MOUTH DAILY. 30 tablet 5  . SUMAtriptan (IMITREX) 25 MG tablet TAKE 1 TABLET BY MOUTH EVERY 2 HOURS AS NEEDED FOR MIGRAINE. MAY REPEAT IN 2 HOURS IF HEADACHE PERSISTS OR RECURS. 10 tablet 0  . diclofenac (VOLTAREN) 75 MG EC tablet Take 1 tablet (75 mg total) by mouth 2 (two) times daily. 60 tablet 2   No current facility-administered medications for this visit.     Allergies  Allergen Reactions  . Codeine Sulfate     REACTION: Hives, can't breathe  . Latex Hives    Family History  Problem Relation Age of Onset  . Hypertension Mother   . Diverticulitis Mother   . Fibromyalgia Mother   . Heart failure Mother   . Cancer Sister        uterine  . Diabetes Maternal Uncle   . Heart disease Maternal Uncle   . Asthma Maternal Grandmother   . Heart disease Maternal Grandmother   . Emphysema Maternal Grandmother  smoked  . Colon cancer Neg Hx   . Esophageal cancer Neg Hx   . Rectal cancer Neg Hx   . Stomach cancer Neg Hx     Social History   Socioeconomic History  . Marital status: Married    Spouse name: Not on file  . Number of children: 3  . Years of education: Not on file  . Highest education level: Not on file  Occupational History  . 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 Financial resource strain: Not on file  . Food insecurity:    Worry: Not on file    Inability: Not on file  . Transportation needs:    Medical: Not on file    Non-medical: Not on file  Tobacco Use  . Smoking status: Never Smoker  . Smokeless tobacco: Never Used    Substance and Sexual Activity  . Alcohol use: No  . Drug use: No  . Sexual activity: Yes  Lifestyle  . Physical activity:    Days per week: Not on file    Minutes per session: Not on file  . Stress: Not on file  Relationships  . Social connections:    Talks on phone: Not on file    Gets together: Not on file    Attends religious service: Not on file    Active member of club or organization: Not on file    Attends meetings of clubs or organizations: Not on file    Relationship status: Not on file  . Intimate partner violence:    Fear of current or ex partner: Not on file    Emotionally abused: Not on file    Physically abused: Not on file    Forced sexual activity: Not on file  Other Topics Concern  . 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  Musculoskeletal: Pt reports bilateral knee pain and swelling. Denies decrease in range of motion, difficulty with gait, muscle pain.  Skin: Pt reports bruising of right foot. Denies redness, rashes, lesions or ulcercations.    No other specific complaints in a complete review of systems (except as listed in HPI above).  Objective:   Physical Exam  BP 122/84   Pulse 94   Temp 99.4 F (37.4 C) (Oral)   Wt 265 lb (120.2 kg)   SpO2 97%   BMI 45.49 kg/m  Wt Readings from Last 3 Encounters:  08/20/18 265 lb (120.2 kg)  08/02/17 245 lb (111.1 kg)  02/06/17 249 lb 12 oz (113.3 kg)    General: Appears her stated age, obese, in NAD. Skin: Warm, dry and intact.  Bruising noted over the fourth and fifth metatarsals right foot. Musculoskeletal: Normal flexion and extension of bilateral knees.  Pain with palpation of her bilateral joint lines, bilateral knees.  Minimal joint swelling noted but  joint is enlarged.  Strength 5/5 BLE. Dorsiflexion and plantarflexion of the right foot.  No pain with palpation over the fourth and fifth metatarsals.  Difficulty going from sitting to standing position.  No difficulty with gait. Neurological: Alert and oriented.  Sensation intact to BLE.  BMET    Component Value Date/Time   NA 141 08/02/2017 1019   K 5.0 08/02/2017 1019  CL 104 08/02/2017 1019   CO2 30 08/02/2017 1019   GLUCOSE 91 08/02/2017 1019   BUN 21 08/02/2017 1019   CREATININE 0.84 08/02/2017 1019   CALCIUM 9.8 08/02/2017 1019   GFRNONAA 81.49 04/08/2010 0831   GFRAA 77 05/16/2007 0947    Lipid Panel     Component Value Date/Time   CHOL 195 08/02/2017 1019   TRIG 150.0 (H) 08/02/2017 1019   HDL 41.40 08/02/2017 1019   CHOLHDL 5 08/02/2017 1019   VLDL 30.0 08/02/2017 1019   LDLCALC 124 (H) 08/02/2017 1019    CBC    Component Value Date/Time   WBC 9.6 08/02/2017 1019   RBC 4.62 08/02/2017 1019   HGB 14.7 08/02/2017 1019   HCT 44.6 08/02/2017 1019   PLT 300.0 08/02/2017 1019   MCV 96.6 08/02/2017 1019   MCHC 33.1 08/02/2017 1019   RDW 13.3 08/02/2017 1019   LYMPHSABS 3.3 07/04/2013 0827   MONOABS 1.0 07/04/2013 0827   EOSABS 0.5 07/04/2013 0827   BASOSABS 0.1 07/04/2013 0827    Hgb A1C Lab Results  Component Value Date   HGBA1C 5.6 08/02/2017            Assessment & Plan:   Left Knee Pain, Right Knee Pain, OA Bilateral Knees, Right Foot Bruising:  Will obtain x-ray of left knee and right foot today Advised her if she did not want surgical intervention, she can discuss repeat injections with the orthopedist Encouraged weight loss and discussed how this will improve her joint pain Refilled Diclofenac and advised her to start taking as prescribed  We will follow-up after x-rays are back, advised her to follow-up with her orthopedist as well. Nicki Reaper, NP

## 2018-08-21 ENCOUNTER — Telehealth: Payer: Self-pay

## 2018-08-21 NOTE — Telephone Encounter (Signed)
Pt left v/m requesting imaging results for xrays done on 08/20/18. Please advise.

## 2018-08-21 NOTE — Telephone Encounter (Signed)
Results were sent through mychart.

## 2018-08-22 ENCOUNTER — Encounter: Payer: Self-pay | Admitting: Internal Medicine

## 2018-08-22 NOTE — Patient Instructions (Signed)

## 2018-08-22 NOTE — Telephone Encounter (Signed)
Pt reports she read the mychart message and will f/u with ortho

## 2018-09-13 ENCOUNTER — Other Ambulatory Visit: Payer: Self-pay | Admitting: Internal Medicine

## 2018-09-17 NOTE — Telephone Encounter (Signed)
Last filled 08/16/2018, has upcoming appt for CPE.... Please advise

## 2018-09-25 ENCOUNTER — Other Ambulatory Visit: Payer: Self-pay | Admitting: Internal Medicine

## 2018-09-25 DIAGNOSIS — F411 Generalized anxiety disorder: Secondary | ICD-10-CM

## 2018-09-25 DIAGNOSIS — J3089 Other allergic rhinitis: Secondary | ICD-10-CM

## 2018-10-01 ENCOUNTER — Other Ambulatory Visit: Payer: Self-pay | Admitting: Internal Medicine

## 2018-10-01 DIAGNOSIS — J4541 Moderate persistent asthma with (acute) exacerbation: Secondary | ICD-10-CM

## 2018-10-07 ENCOUNTER — Ambulatory Visit (INDEPENDENT_AMBULATORY_CARE_PROVIDER_SITE_OTHER): Payer: BLUE CROSS/BLUE SHIELD | Admitting: Internal Medicine

## 2018-10-07 ENCOUNTER — Encounter: Payer: Self-pay | Admitting: Internal Medicine

## 2018-10-07 VITALS — BP 122/84 | HR 82 | Temp 98.7°F | Ht 64.0 in | Wt 266.0 lb

## 2018-10-07 DIAGNOSIS — K219 Gastro-esophageal reflux disease without esophagitis: Secondary | ICD-10-CM

## 2018-10-07 DIAGNOSIS — Z0000 Encounter for general adult medical examination without abnormal findings: Secondary | ICD-10-CM

## 2018-10-07 DIAGNOSIS — G43C1 Periodic headache syndromes in child or adult, intractable: Secondary | ICD-10-CM | POA: Diagnosis not present

## 2018-10-07 DIAGNOSIS — J4530 Mild persistent asthma, uncomplicated: Secondary | ICD-10-CM

## 2018-10-07 DIAGNOSIS — F411 Generalized anxiety disorder: Secondary | ICD-10-CM

## 2018-10-07 DIAGNOSIS — N3946 Mixed incontinence: Secondary | ICD-10-CM

## 2018-10-07 NOTE — Assessment & Plan Note (Signed)
Continue Ditropan Will monitor 

## 2018-10-07 NOTE — Assessment & Plan Note (Signed)
Continue Sertraline Support offered today 

## 2018-10-07 NOTE — Patient Instructions (Signed)

## 2018-10-07 NOTE — Assessment & Plan Note (Signed)
Continue Symbicort and Albuterol Will monitor 

## 2018-10-07 NOTE — Assessment & Plan Note (Signed)
Continue Imitrex prn Will monitor 

## 2018-10-07 NOTE — Assessment & Plan Note (Signed)
Continue Famotadine  Discussed avoiding triggers, exercise for weight loss

## 2018-10-07 NOTE — Progress Notes (Signed)
Subjective:    Patient ID: Jamie Rowland, female    DOB: 10-Aug-1961, 57 y.o.   MRN: [id number]  HPI  Pt presents to the clinic today for her annual exam. She is also due to follow up chronic conditions.  Asthma: Moderate persistent. Controlled on Symbicort. She uses Albuterol on an as needed bases only. There are no PFT's on file.  Migraines: These occur 1-2 times per week. Triggered by smells and changes in the weather. She takes Imitrex as needed with good relief.  GERD: Triggered by everything she eats. She denies breakthrough on Famotidine. There is no upper GI on file.  Anxiety: Chronic but stable on Sertraline. She denies depression, SI/HI.  Mixed Incontinence: She is taking Oxybutynin as prescribed.  Flu: 07/2018 Tetanus: 07/2016 Pneumovax: 07/2016 Pap Smear: 07/2016 Mammogram: 03/2011, will call [facility] in January Colon Screening: 11/2013 Vision Screening: annually Dentist: biannually  Diet: She does eat meat. She consumes fruits and veggies daily. She does eat fried foods. She drinks mostly water, some tea. Exercise: None  Review of Systems  Past Medical History:  Diagnosis Date  . Asthma   . Bronchitis   . Migraine headache   . Seasonal allergies     Current Outpatient Medications  Medication Sig Dispense Refill  . albuterol (PROVENTIL HFA;VENTOLIN HFA) 108 (90 Base) MCG/ACT inhaler Inhale 2 puffs into the lungs every 6 (six) hours as needed for wheezing or shortness of breath. 1 Inhaler 2  . budesonide-formoterol (SYMBICORT) 80-4.5 MCG/ACT inhaler INHALE 2 PUFFS EVERY 12 HOURS TO PREVENT COUGH/WHEEZE. RINSE, GARGLE, AND SPIT AFTER USE. 10.2 g 0  . diclofenac (VOLTAREN) 75 MG EC tablet Take 1 tablet (75 mg total) by mouth 2 (two) times daily. 60 tablet 2  . famotidine (PEPCID) 20 MG tablet Take 1 tablet (20 mg total) by mouth 2 (two) times daily. One at bedtime    . montelukast (SINGULAIR) 10 MG tablet TAKE 1 TABLET BY MOUTH EVERY NIGHT AT BEDTIME. 30 tablet 0   . oxybutynin (DITROPAN XL) 15 MG 24 hr tablet TAKE 1 TABLET BY MOUTH AT BEDTIME. 30 tablet 0  . sertraline (ZOLOFT) 25 MG tablet TAKE 1 TABLET BY MOUTH DAILY. 30 tablet 0  . SUMAtriptan (IMITREX) 25 MG tablet TAKE 1 TABLET BY MOUTH EVERY 2 HOURS AS NEEDED FOR MIGRAINE. MAY REPEAT IN 2 HOURS IF HEADACHE PERSISTS OR RECURS. 10 tablet 0   No current facility-administered medications for this visit.     Allergies  Allergen Reactions  . Codeine Sulfate     REACTION: Hives, can't breathe  . Latex Hives    Family History  Problem Relation Age of Onset  . Hypertension Mother   . Diverticulitis Mother   . Fibromyalgia Mother   . Heart failure Mother   . Cancer Sister        uterine  . Diabetes Maternal Uncle   . Heart disease Maternal Uncle   . Asthma Maternal Grandmother   . Heart disease Maternal Grandmother   . Emphysema Maternal Grandmother        smoked  . Colon cancer Neg Hx   . Esophageal cancer Neg Hx   . Rectal cancer Neg Hx   . Stomach cancer Neg Hx     Social History   Socioeconomic History  . Marital status: Married    Spouse name: Not on file  . Number of children: 3  . Years of education: Not on file  . Highest education level: Not on file  Occupational History  . Occupation: Health and safety inspectortudent     Employer: Not Employed  Social Needs  . Financial resource strain: Not on file  . Food insecurity:    Worry: Not on file    Inability: Not on file  . Transportation needs:    Medical: Not on file    Non-medical: Not on file  Tobacco Use  . Smoking status: Never Smoker  . Smokeless tobacco: Never Used  Substance and Sexual Activity  . Alcohol use: No  . Drug use: No  . Sexual activity: Yes  Lifestyle  . Physical activity:    Days per week: Not on file    Minutes per session: Not on file  . Stress: Not on file  Relationships  . Social connections:    Talks on phone: Not on file    Gets together: Not on file    Attends religious service: Not on file    Active  member of club or organization: Not on file    Attends meetings of clubs or organizations: Not on file    Relationship status: Not on file  . Intimate partner violence:    Fear of current or ex partner: Not on file    Emotionally abused: Not on file    Physically abused: Not on file    Forced sexual activity: Not on file  Other Topics Concern  . 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  HEENT: Denies eye pain, eye redness, ear pain, ringing in the ears, wax buildup, runny nose, nasal congestion, bloody nose, or sore throat. Respiratory: Denies difficulty breathing, shortness of breath, cough or sputum production.   Cardiovascular: Denies chest pain, chest tightness, palpitations or swelling in the hands or feet.  Gastrointestinal: Denies abdominal pain, bloating, constipation, diarrhea or blood in the stool.  GU: Denies urgency, frequency, pain with urination, burning sensation, blood in urine, odor or discharge. Musculoskeletal: Pt reports bilateral knee pain. Denies decrease in range of motion, difficulty with gait, muscle pain or joint swelling.  Skin: Denies redness, rashes, lesions or ulcercations.  Neurological: Denies dizziness, difficulty with memory, difficulty with speech or problems with balance and coordination.  Psych: Denies anxiety, depression, SI/HI.  No other specific complaints in a complete review of systems (except as listed in HPI above).     Objective:   Physical Exam  BP 122/84   Pulse 82   Temp 98.7 F (37.1 C) (Oral)   Ht 5\' 4"  (1.626 m)   Wt 266 lb (120.7 kg)   SpO2 98%   BMI 45.66 kg/m  Wt Readings from Last 3 Encounters:  10/07/18 266 lb (120.7 kg)  08/20/18 265 lb (120.2 kg)  08/02/17 245 lb (111.1  kg)    General: Appears her stated age, obese, in NAD. Skin: Warm, dry and intact. No rashes, lesions or ulcerations noted. HEENT: Head: normal shape and size; Eyes: sclera white, no icterus, conjunctiva pink, PERRLA and EOMs intact; Ears: Tm's gray and intact, normal light reflex; Throat/Mouth: Teeth present, mucosa pink and moist, no exudate, lesions or ulcerations noted.  Neck:  Neck supple, trachea midline. No masses, lumps or thyromegaly present.  Cardiovascular: Normal rate and rhythm. S1,S2 noted.  No murmur, rubs or gallops noted. No JVD or BLE  edema. No carotid bruits. Pulmonary/Chest: Normal effort and positive vesicular breath sounds. No respiratory distress. No wheezes, rales or ronchi noted.  Abdomen: Soft and nontender. Normal bowel sounds. No distention or masses noted. Liver, spleen and kidneys non palpable. Musculoskeletal: Strength 5/5 BUE/BLE. No difficulty with gait.  Neurological: Alert and oriented. Cranial nerves II-XII grossly intact. Coordination normal.  Psychiatric: Mood and affect normal. Behavior is normal. Judgment and thought content normal.    BMET    Component Value Date/Time   NA 141 08/02/2017 1019   K 5.0 08/02/2017 1019   CL 104 08/02/2017 1019   CO2 30 08/02/2017 1019   GLUCOSE 91 08/02/2017 1019   BUN 21 08/02/2017 1019   CREATININE 0.84 08/02/2017 1019   CALCIUM 9.8 08/02/2017 1019   GFRNONAA 81.49 04/08/2010 0831   GFRAA 77 05/16/2007 0947    Lipid Panel     Component Value Date/Time   CHOL 195 08/02/2017 1019   TRIG 150.0 (H) 08/02/2017 1019   HDL 41.40 08/02/2017 1019   CHOLHDL 5 08/02/2017 1019   VLDL 30.0 08/02/2017 1019   LDLCALC 124 (H) 08/02/2017 1019    CBC    Component Value Date/Time   WBC 9.6 08/02/2017 1019   RBC 4.62 08/02/2017 1019   HGB 14.7 08/02/2017 1019   HCT 44.6 08/02/2017 1019   PLT 300.0 08/02/2017 1019   MCV 96.6 08/02/2017 1019   MCHC 33.1 08/02/2017 1019   RDW 13.3 08/02/2017 1019   LYMPHSABS 3.3  07/04/2013 0827   MONOABS 1.0 07/04/2013 0827   EOSABS 0.5 07/04/2013 0827   BASOSABS 0.1 07/04/2013 0827    Hgb A1C Lab Results  Component Value Date   HGBA1C 5.6 08/02/2017            Assessment & Plan:   Preventative Health Maintenance:  Flu shot UTD Tetanus UTD Pneumovax UTD Pap smear UTD She will call to set up her mammogram Colon screening UTD Encouraged her to consume a balanced diet and exercise regimen Advised her to see an eye doctor and dentist annually Will check CBC, CMET, Lipid, and Vit D today  RTC in 1 year, sooner if needed Jamie Reaperegina Lawan Nanez, NP

## 2018-10-08 LAB — CBC
HCT: 42.5 % (ref 36.0–46.0)
HEMOGLOBIN: 14.2 g/dL (ref 12.0–15.0)
MCHC: 33.4 g/dL (ref 30.0–36.0)
MCV: 94.8 fl (ref 78.0–100.0)
PLATELETS: 260 10*3/uL (ref 150.0–400.0)
RBC: 4.48 Mil/uL (ref 3.87–5.11)
RDW: 13.5 % (ref 11.5–15.5)
WBC: 8.9 10*3/uL (ref 4.0–10.5)

## 2018-10-08 LAB — COMPREHENSIVE METABOLIC PANEL
ALBUMIN: 4 g/dL (ref 3.5–5.2)
ALK PHOS: 56 U/L (ref 39–117)
ALT: 24 U/L (ref 0–35)
AST: 16 U/L (ref 0–37)
BILIRUBIN TOTAL: 0.3 mg/dL (ref 0.2–1.2)
BUN: 25 mg/dL — ABNORMAL HIGH (ref 6–23)
CALCIUM: 9.3 mg/dL (ref 8.4–10.5)
CO2: 27 mEq/L (ref 19–32)
Chloride: 105 mEq/L (ref 96–112)
Creatinine, Ser: 1.13 mg/dL (ref 0.40–1.20)
GFR: 52.91 mL/min — AB (ref >60.00)
Glucose, Bld: 97 mg/dL (ref 70–99)
Potassium: 4.2 mEq/L (ref 3.5–5.1)
Sodium: 139 mEq/L (ref 135–145)
TOTAL PROTEIN: 7.4 g/dL (ref 6.0–8.3)

## 2018-10-08 LAB — LIPID PANEL
CHOLESTEROL: 180 mg/dL (ref 0–200)
HDL: 33.6 mg/dL — AB (ref >39.00)
NonHDL: 146.34
TRIGLYCERIDES: 337 mg/dL — AB (ref 0.0–149.0)
Total CHOL/HDL Ratio: 5
VLDL: 67.4 mg/dL — ABNORMAL HIGH (ref 0.0–40.0)

## 2018-10-08 LAB — VITAMIN D 25 HYDROXY (VIT D DEFICIENCY, FRACTURES): VITD: 25.75 ng/mL — AB (ref 30.00–100.00)

## 2018-10-08 LAB — LDL CHOLESTEROL, DIRECT: Direct LDL: 121 mg/dL

## 2018-10-18 ENCOUNTER — Other Ambulatory Visit: Payer: Self-pay | Admitting: Internal Medicine

## 2018-10-18 DIAGNOSIS — N289 Disorder of kidney and ureter, unspecified: Secondary | ICD-10-CM

## 2018-10-18 DIAGNOSIS — F411 Generalized anxiety disorder: Secondary | ICD-10-CM

## 2018-10-18 DIAGNOSIS — J4541 Moderate persistent asthma with (acute) exacerbation: Secondary | ICD-10-CM

## 2018-10-18 DIAGNOSIS — J3089 Other allergic rhinitis: Secondary | ICD-10-CM

## 2018-11-05 ENCOUNTER — Telehealth: Payer: Self-pay | Admitting: Internal Medicine

## 2018-11-05 ENCOUNTER — Other Ambulatory Visit: Payer: BLUE CROSS/BLUE SHIELD

## 2018-11-05 NOTE — Telephone Encounter (Signed)
Pt called office requesting to get her xrays on a disk. Pt has a lab only appointment on Thursday here and later that day another appointment, which she will need her xrays.

## 2018-11-07 ENCOUNTER — Other Ambulatory Visit (INDEPENDENT_AMBULATORY_CARE_PROVIDER_SITE_OTHER): Payer: BLUE CROSS/BLUE SHIELD

## 2018-11-07 DIAGNOSIS — M1711 Unilateral primary osteoarthritis, right knee: Secondary | ICD-10-CM | POA: Diagnosis not present

## 2018-11-07 DIAGNOSIS — N289 Disorder of kidney and ureter, unspecified: Secondary | ICD-10-CM | POA: Diagnosis not present

## 2018-11-07 DIAGNOSIS — E669 Obesity, unspecified: Secondary | ICD-10-CM | POA: Diagnosis not present

## 2018-11-07 DIAGNOSIS — M1712 Unilateral primary osteoarthritis, left knee: Secondary | ICD-10-CM | POA: Diagnosis not present

## 2018-11-07 DIAGNOSIS — M25561 Pain in right knee: Secondary | ICD-10-CM | POA: Diagnosis not present

## 2018-11-07 LAB — BASIC METABOLIC PANEL
BUN: 20 mg/dL (ref 6–23)
CHLORIDE: 106 meq/L (ref 96–112)
CO2: 29 mEq/L (ref 19–32)
Calcium: 9.3 mg/dL (ref 8.4–10.5)
Creatinine, Ser: 0.96 mg/dL (ref 0.40–1.20)
GFR: 60.07 mL/min (ref >60.00)
Glucose, Bld: 90 mg/dL (ref 70–99)
Potassium: 3.7 mEq/L (ref 3.5–5.1)
Sodium: 141 mEq/L (ref 135–145)

## 2018-12-05 ENCOUNTER — Ambulatory Visit: Payer: BLUE CROSS/BLUE SHIELD | Admitting: Internal Medicine

## 2018-12-05 ENCOUNTER — Encounter: Payer: Self-pay | Admitting: Internal Medicine

## 2018-12-05 VITALS — BP 122/82 | HR 84 | Temp 98.1°F | Wt 262.0 lb

## 2018-12-05 DIAGNOSIS — R635 Abnormal weight gain: Secondary | ICD-10-CM | POA: Diagnosis not present

## 2018-12-05 MED ORDER — PHENTERMINE HCL 37.5 MG PO CAPS: 37.5000 mg | ORAL_CAPSULE | ORAL | 0 refills | Status: DC

## 2018-12-05 NOTE — Patient Instructions (Signed)
Carbohydrate Counting for Diabetes Mellitus, Adult  Carbohydrate counting is a method of keeping track of how many carbohydrates you eat. Eating carbohydrates naturally increases the amount of sugar (glucose) in the blood. Counting how many carbohydrates you eat helps keep your blood glucose within normal limits, which helps you manage your diabetes (diabetes mellitus). It is important to know how many carbohydrates you can safely have in each meal. This is different for every person. A diet and nutrition specialist (registered dietitian) can help you make a meal plan and calculate how many carbohydrates you should have at each meal and snack. Carbohydrates are found in the following foods:  Grains, such as breads and cereals.  Dried beans and soy products.  Starchy vegetables, such as potatoes, peas, and corn.  Fruit and fruit juices.  Milk and yogurt.  Sweets and snack foods, such as cake, cookies, candy, chips, and soft drinks. How do I count carbohydrates? There are two ways to count carbohydrates in food. You can use either of the methods or a combination of both. Reading "Nutrition Facts" on packaged food The "Nutrition Facts" list is included on the labels of almost all packaged foods and beverages in the [place] It includes:  The serving size.  Information about nutrients in each serving, including the grams (g) of carbohydrate per serving. To use the "Nutrition Facts":  Decide how many servings you will have.  Multiply the number of servings by the number of carbohydrates per serving.  The resulting number is the total amount of carbohydrates that you will be having. Learning standard serving sizes of other foods When you eat carbohydrate foods that are not packaged or do not include "Nutrition Facts" on the label, you need to measure the servings in order to count the amount of carbohydrates:  Measure the foods that you will eat with a food scale or measuring cup, if needed.   Decide how many standard-size servings you will eat.  Multiply the number of servings by 15. Most carbohydrate-rich foods have about 15 g of carbohydrates per serving. ? For example, if you eat 8 oz (170 g) of strawberries, you will have eaten 2 servings and 30 g of carbohydrates (2 servings x 15 g = 30 g).  For foods that have more than one food mixed, such as soups and casseroles, you must count the carbohydrates in each food that is included. The following list contains standard serving sizes of common carbohydrate-rich foods. Each of these servings has about 15 g of carbohydrates:   hamburger bun or  English muffin.   oz (15 mL) syrup.   oz (14 g) jelly.  1 slice of bread.  1 six-inch tortilla.  3 oz (85 g) cooked rice or pasta.  4 oz (113 g) cooked dried beans.  4 oz (113 g) starchy vegetable, such as peas, corn, or potatoes.  4 oz (113 g) hot cereal.  4 oz (113 g) mashed potatoes or  of a large baked potato.  4 oz (113 g) canned or frozen fruit.  4 oz (120 mL) fruit juice.  4-6 crackers.  6 chicken nuggets.  6 oz (170 g) unsweetened dry cereal.  6 oz (170 g) plain fat-free yogurt or yogurt sweetened with artificial sweeteners.  8 oz (240 mL) milk.  8 oz (170 g) fresh fruit or one small piece of fruit.  24 oz (680 g) popped popcorn. Example of carbohydrate counting Sample meal  3 oz (85 g) chicken breast.  6 oz (170 g)   brown rice.  4 oz (113 g) corn.  8 oz (240 mL) milk.  8 oz (170 g) strawberries with sugar-free whipped topping. Carbohydrate calculation 1. Identify the foods that contain carbohydrates: ? Rice. ? Corn. ? Milk. ? Strawberries. 2. Calculate how many servings you have of each food: ? 2 servings rice. ? 1 serving corn. ? 1 serving milk. ? 1 serving strawberries. 3. Multiply each number of servings by 15 g: ? 2 servings rice x 15 g = 30 g. ? 1 serving corn x 15 g = 15 g. ? 1 serving milk x 15 g = 15 g. ? 1 serving  strawberries x 15 g = 15 g. 4. Add together all of the amounts to find the total grams of carbohydrates eaten: ? 30 g + 15 g + 15 g + 15 g = 75 g of carbohydrates total. Summary  Carbohydrate counting is a method of keeping track of how many carbohydrates you eat.  Eating carbohydrates naturally increases the amount of sugar (glucose) in the blood.  Counting how many carbohydrates you eat helps keep your blood glucose within normal limits, which helps you manage your diabetes.  A diet and nutrition specialist (registered dietitian) can help you make a meal plan and calculate how many carbohydrates you should have at each meal and snack. This information is not intended to replace advice given to you by your health care provider. Make sure you discuss any questions you have with your health care provider. Document Released: 10/02/2005 Document Revised: 04/11/2017 Document Reviewed: 03/15/2016 Elsevier Interactive Patient Education  2019 [employer]  

## 2018-12-05 NOTE — Progress Notes (Signed)
Subjective:    Patient ID: Jamie Rowland, female    DOB: January 27, 1961, 58 y.o.   MRN: [id number]  HPI  Pt presents to the clinic today to discuss her options for weight loss. She is in need of bilateral knee replacements. Her surgeon would like her to lose weight and get her BMI under 41 before they decide to do surgery. Her weight today is 262 lbs with a BMI of 44.97. She has trouble exercising due to her knee pain but has joined the [facility] so that she can start water aerobics.  Breakfast: Nothing Lunch: Grilled Cheese Dinner: Baked Chicken, mashed potatoes and corn Snacks: fruit Exercise: None  Review of Systems      Past Medical History:  Diagnosis Date  . Asthma   . Bronchitis   . Migraine headache   . Seasonal allergies     Current Outpatient Medications  Medication Sig Dispense Refill  . albuterol (PROVENTIL HFA;VENTOLIN HFA) 108 (90 Base) MCG/ACT inhaler Inhale 2 puffs into the lungs every 6 (six) hours as needed for wheezing or shortness of breath. 1 Inhaler 2  . budesonide-formoterol (SYMBICORT) 80-4.5 MCG/ACT inhaler INHALE 2 PUFFS EVERY 12 HOURS TO PREVENT COUGH/WHEEZE. RINSE, GARGLE, AND SPIT AFTER USE. 10.2 g 10  . famotidine (PEPCID) 20 MG tablet Take 1 tablet (20 mg total) by mouth 2 (two) times daily. One at bedtime    . montelukast (SINGULAIR) 10 MG tablet TAKE 1 TABLET BY MOUTH EVERY NIGHT AT BEDTIME. 30 tablet 10  . oxybutynin (DITROPAN XL) 15 MG 24 hr tablet TAKE 1 TABLET BY MOUTH AT BEDTIME. 30 tablet 10  . sertraline (ZOLOFT) 25 MG tablet TAKE 1 TABLET BY MOUTH DAILY. 10 tablet 0  . SUMAtriptan (IMITREX) 25 MG tablet TAKE 1 TABLET BY MOUTH ONCE WHEN NEEDED FOR MIGRAINE. MAY REPEAT IN 2 HOURS IF HEADACHE PERSISTS OR RECURS. 10 tablet 2  . diclofenac (VOLTAREN) 75 MG EC tablet Take 1 tablet (75 mg total) by mouth 2 (two) times daily. (Patient not taking: Reported on 12/05/2018) 60 tablet 2   No current facility-administered medications for this visit.      Allergies  Allergen Reactions  . Codeine Sulfate     REACTION: Hives, can't breathe  . Latex Hives    Family History  Problem Relation Age of Onset  . Hypertension Mother   . Diverticulitis Mother   . Fibromyalgia Mother   . Heart failure Mother   . Cancer Sister        uterine  . Diabetes Maternal Uncle   . Heart disease Maternal Uncle   . Asthma Maternal Grandmother   . Heart disease Maternal Grandmother   . Emphysema Maternal Grandmother        smoked  . Colon cancer Neg Hx   . Esophageal cancer Neg Hx   . Rectal cancer Neg Hx   . Stomach cancer Neg Hx     Social History   Socioeconomic History  . Marital status: Married    Spouse name: Not on file  . Number of children: 3  . Years of education: Not on file  . Highest education level: Not on file  Occupational History  . 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 Financial resource strain: Not on file  . Food insecurity:    Worry: Not on file    Inability: Not on file  . Transportation needs:    Medical: Not on file  Non-medical: Not on file  Tobacco Use  . Smoking status: Never Smoker  . Smokeless tobacco: Never Used  Substance and Sexual Activity  . Alcohol use: No  . Drug use: No  . Sexual activity: Yes  Lifestyle  . Physical activity:    Days per week: Not on file    Minutes per session: Not on file  . Stress: Not on file  Relationships  . Social connections:    Talks on phone: Not on file    Gets together: Not on file    Attends religious service: Not on file    Active member of club or organization: Not on file    Attends meetings of clubs or organizations: Not on file    Relationship status: Not on file  . Intimate partner violence:    Fear of current or ex partner: Not on file    Emotionally abused: Not on file    Physically abused: Not on file    Forced sexual activity: Not on file  Other Topics Concern  . 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 Denies fever, malaise, fatigue, headache.  Respiratory: Denies difficulty breathing, shortness of breath, cough or sputum production.   Cardiovascular: Denies chest pain, chest tightness, palpitations or swelling in the hands or feet.  Musculoskeletal: Pt reports bilateral knee pain. Denies decrease in range of motion, difficulty with gait, muscle pain or joint swelling.   No other specific complaints in a complete review of systems (except as listed in HPI above).  Objective:   Physical Exam   BP 122/82   Pulse 84   Temp 98.1 F (36.7 C) (Oral)   Wt 262 lb (118.8 kg)   SpO2 98%   BMI 44.97 kg/m  Wt Readings from Last 3 Encounters:  12/05/18 262 lb (118.8 kg)  10/07/18 266 lb (120.7 kg)  08/20/18 265 lb (120.2 kg)    General: Appears her stated age, obese, in NAD. Cardiovascular: Normal rate and rhythm. S1,S2 noted.  No murmur, rubs or gallops noted.  Pulmonary/Chest: Normal effort and positive vesicular breath sounds. No respiratory distress. No wheezes, rales or ronchi noted.  Musculoskeletal:  No difficulty with gait.  Neurological: Alert and oriented.    BMET    Component Value Date/Time   NA 141 11/07/2018 0930   K 3.7 11/07/2018 0930   CL 106 11/07/2018 0930   CO2 29 11/07/2018 0930   GLUCOSE 90 11/07/2018 0930   BUN 20 11/07/2018 0930   CREATININE 0.96 11/07/2018 0930   CALCIUM 9.3 11/07/2018 0930   GFRNONAA 81.49 04/08/2010 0831   GFRAA 77 05/16/2007 0947    Lipid Panel     Component Value Date/Time   CHOL 180 10/07/2018 1521   TRIG 337.0 (H) 10/07/2018 1521   HDL 33.60 (L) 10/07/2018 1521   CHOLHDL 5 10/07/2018 1521   VLDL 67.4 (H) 10/07/2018 1521   LDLCALC 124 (H) 08/02/2017 1019    CBC    Component Value Date/Time   WBC 8.9  10/07/2018 1521   RBC 4.48 10/07/2018 1521   HGB 14.2 10/07/2018 1521   HCT 42.5 10/07/2018 1521   PLT 260.0 10/07/2018 1521   MCV 94.8 10/07/2018 1521   MCHC 33.4 10/07/2018 1521   RDW 13.5 10/07/2018 1521  LYMPHSABS 3.3 07/04/2013 0827   MONOABS 1.0 07/04/2013 0827   EOSABS 0.5 07/04/2013 0827   BASOSABS 0.1 07/04/2013 0827    Hgb A1C Lab Results  Component Value Date   HGBA1C 5.6 08/02/2017          Assessment & Plan:   Abnormal Weight Gain:  Discussed low carb, high protein, diet Discussed ways to increase fruits and veggies into her diet Encouraged water aerobics for exercise RX for Phentermine 37.5 mg daily  RTC in 1 month for weight check/med refill Nicki Reaperegina Kyoko Elsea, NP

## 2019-01-02 ENCOUNTER — Telehealth: Payer: Self-pay

## 2019-01-02 NOTE — Telephone Encounter (Signed)
This is a controlled substance and requires follow up, but not urgent. She can schedule an appt.

## 2019-01-02 NOTE — Telephone Encounter (Signed)
Pt needs a refill of phentermine.  Asking if she needs to make an OV to get the medication since we have social distancing. [phone]

## 2019-01-15 ENCOUNTER — Telehealth: Payer: Self-pay | Admitting: Internal Medicine

## 2019-01-15 NOTE — Telephone Encounter (Signed)
Pt want to know if she need an appt  Or does she need to come in or Webex appt to receive her diet pills. Please advise.

## 2019-01-17 ENCOUNTER — Other Ambulatory Visit: Payer: Self-pay | Admitting: Internal Medicine

## 2019-01-20 NOTE — Telephone Encounter (Signed)
Left detailed msg on VM per HIPAA This medication requires pt to be monitored in office, we are not currently seeing pts and this is not a must have medication. Pt will have to wait until after we are back to normal from the corona virus

## 2019-01-31 ENCOUNTER — Other Ambulatory Visit: Payer: Self-pay | Admitting: Internal Medicine

## 2019-02-01 NOTE — Telephone Encounter (Signed)
Last filled 08/20/2018 with 2 refills.... CPE done 09/2018... please advise

## 2019-02-27 ENCOUNTER — Other Ambulatory Visit: Payer: Self-pay | Admitting: Internal Medicine

## 2019-02-28 NOTE — Telephone Encounter (Signed)
Last filled 10/21/2018 with 2 refills... please advise

## 2019-04-07 ENCOUNTER — Telehealth: Payer: Self-pay

## 2019-04-07 NOTE — Telephone Encounter (Signed)
Patient calls in to report concern in how she was treated recently when asking for refill on her medication phentermine.    Patient initially called on 01/02/19 for refill.  No one called her back for a few weeks.  Then once she was called back, she was informed that refill was denied as "this was not important and because it was a controlled substance she couldn't be seen until the COVID situation calmed down".  She was very hurt by this as she is needing a knee replacement before she can go to work and her orthopedist had given her 3 months to work on her weight before moving forward.  This was very important to her and she felt like we didn't care and were dismissive of her care and needs.    I did explain that  Rollene Fare did state that she could make an appointment on 01/02/19 to discuss this.  I will need to investigate this further as to why an appointment was not offered. I am not sure that virtual visits could be done for controlled substance visit at that time and this could have been the cause.    I apologized that her perception was that we did not take her concerns to heart as that is not the way we want her to feel.  She will see PCP this Thursday and will discuss it further with her at that time.    She thanks me for the discussion and will discuss further with PCP.   FYI to Webb Silversmith, NP and Lindalou Hose, CMA.

## 2019-04-10 ENCOUNTER — Other Ambulatory Visit: Payer: Self-pay

## 2019-04-10 ENCOUNTER — Ambulatory Visit: Payer: 59 | Admitting: Internal Medicine

## 2019-04-10 ENCOUNTER — Encounter: Payer: Self-pay | Admitting: Internal Medicine

## 2019-04-10 VITALS — BP 124/78 | HR 89 | Temp 98.7°F | Wt 268.0 lb

## 2019-04-10 DIAGNOSIS — F419 Anxiety disorder, unspecified: Secondary | ICD-10-CM

## 2019-04-10 DIAGNOSIS — M170 Bilateral primary osteoarthritis of knee: Secondary | ICD-10-CM

## 2019-04-10 DIAGNOSIS — R635 Abnormal weight gain: Secondary | ICD-10-CM

## 2019-04-10 MED ORDER — HYDROXYZINE HCL 10 MG PO TABS: 10.0000 mg | ORAL_TABLET | Freq: Every day | ORAL | 0 refills | Status: DC | PRN

## 2019-04-10 MED ORDER — PHENTERMINE HCL 37.5 MG PO CAPS: 37.5000 mg | ORAL_CAPSULE | ORAL | 1 refills | Status: DC

## 2019-04-10 NOTE — Progress Notes (Signed)
Subjective:    Patient ID: Jamie Rowland, female    DOB: 09/30/1961, 58 y.o.   MRN: [id number]  HPI  Pt presents to the clinic today for follow up on weight, and requesting med refill. She was started on Phentermine 2/20 in preparation for having knee surgery. Her starting weight was 262 lbs with a BMI of 44.97. Due to COVID 19, she was not able to follow up as previously planned and has been out of Phentermine for 3 months. She would like to get restarted on this medication. Her weight today is 268 lbs with a BMI of 46.  Breakfast: 2 boiled eggs, hot dog and yougurt Lunch: 1 boiled egg, sweet tea Dinner: Baked chicken or grilled steaks, steamed veggies Snacks: None  Exercise: None  She also reports that her anxiety is worse since stopping Sertraline. She stopped the Sertraline so she could start Phentermine so she could lose weight and have her knee surgery. She reports she does okay for the most part but wearing the mask is causing her some anxiety/panic attacks. She wanted to know if there is something she could take PRN until she comes off the Phentermine and can restart her Sertraline. She denies depression, SI/HI.  Review of Systems      Past Medical History:  Diagnosis Date  . Asthma   . Bronchitis   . Migraine headache   . Seasonal allergies     Current Outpatient Medications  Medication Sig Dispense Refill  . albuterol (PROVENTIL HFA;VENTOLIN HFA) 108 (90 Base) MCG/ACT inhaler Inhale 2 puffs into the lungs every 6 (six) hours as needed for wheezing or shortness of breath. 1 Inhaler 2  . budesonide-formoterol (SYMBICORT) 80-4.5 MCG/ACT inhaler INHALE 2 PUFFS EVERY 12 HOURS TO PREVENT COUGH/WHEEZE. RINSE, GARGLE, AND SPIT AFTER USE. 10.2 g 10  . diclofenac (VOLTAREN) 75 MG EC tablet TAKE 1 TABLET BY MOUTH 2 (TWO) TIMES DAILY. 60 tablet 2  . famotidine (PEPCID) 20 MG tablet Take 1 tablet (20 mg total) by mouth 2 (two) times daily. One at bedtime    . montelukast (SINGULAIR)  10 MG tablet TAKE 1 TABLET BY MOUTH EVERY NIGHT AT BEDTIME. 30 tablet 10  . oxybutynin (DITROPAN XL) 15 MG 24 hr tablet TAKE 1 TABLET BY MOUTH AT BEDTIME. 30 tablet 10  . phentermine 37.5 MG capsule Take 1 capsule (37.5 mg total) by mouth every morning. 30 capsule 0  . SUMAtriptan (IMITREX) 25 MG tablet TAKE 1 TABLET BY MOUTH ONCE WHEN NEEDED FOR MIGRAINE. MAY REPEAT IN 2 HOURS IF HEADACHE PERSISTS OR RECURS. 10 tablet 2   No current facility-administered medications for this visit.     Allergies  Allergen Reactions  . Codeine Sulfate     REACTION: Hives, can't breathe  . Latex Hives    Family History  Problem Relation Age of Onset  . Hypertension Mother   . Diverticulitis Mother   . Fibromyalgia Mother   . Heart failure Mother   . Cancer Sister        uterine  . Diabetes Maternal Uncle   . Heart disease Maternal Uncle   . Asthma Maternal Grandmother   . Heart disease Maternal Grandmother   . Emphysema Maternal Grandmother        smoked  . Colon cancer Neg Hx   . Esophageal cancer Neg Hx   . Rectal cancer Neg Hx   . Stomach cancer Neg Hx     Social History   Socioeconomic History  .  Marital status: Married    Spouse name: Not on file  . Number of children: 3  . Years of education: Not on file  . Highest education level: Not on file  Occupational History  . 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 Financial resource strain: Not on file  . Food insecurity    Worry: Not on file    Inability: Not on file  . Transportation needs    Medical: Not on file    Non-medical: Not on file  Tobacco Use  . Smoking status: Never Smoker  . Smokeless tobacco: Never Used  Substance and Sexual Activity  . Alcohol use: No  . Drug use: No  . Sexual activity: Yes  Lifestyle  . Physical activity    Days per week: Not on file    Minutes per session: Not on file  . Stress: Not on file  Relationships  . Social Herbalist on phone: Not on file     Gets together: Not on file    Attends religious service: Not on file    Active member of club or organization: Not on file    Attends meetings of clubs or organizations: Not on file    Relationship status: Not on file  . Intimate partner violence    Fear of current or ex partner: Not on file    Emotionally abused: Not on file    Physically abused: Not on file    Forced sexual activity: Not on file  Other Topics Concern  . 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  Respiratory: Denies difficulty breathing, shortness of breath, cough or sputum production.   Cardiovascular: Denies chest pain, chest tightness, palpitations or swelling in the hands or feet.  Neurological: Denies dizziness, difficulty with memory, difficulty with speech or problems with balance and coordination.  Psych: Pt reports anxiety. Denies depression, SI/HI.  No other specific complaints in a complete review of systems (except as listed in HPI above).  Objective:   Physical Exam    BP 124/78   Pulse 89   Temp 98.7 F (37.1 C) (Oral)   Wt 268 lb (121.6 kg)   SpO2 98%   BMI 46.00 kg/m  Wt Readings from Last 3 Encounters:  04/10/19 268 lb (121.6 kg)  12/05/18 262 lb (118.8 kg)  10/07/18 266 lb (120.7 kg)    General: Appears her stated age, obese, in NAD. Cardiovascular: Normal rate and rhythm. S1,S2 noted.  No murmur, rubs or gallops noted.  Pulmonary/Chest: Normal effort and positive vesicular breath sounds. No respiratory distress. No wheezes, rales or ronchi noted.  Musculoskeletal: Joint enlargement noted of bilateral knees. . No difficulty with gait.  Neurological: Alert and oriented.  Psychiatric: Mood and affect normal. Behavior is normal. Judgment  and thought content normal.   BMET    Component Value Date/Time   NA 141 11/07/2018 0930   K 3.7 11/07/2018 0930   CL 106 11/07/2018 0930   CO2 29 11/07/2018 0930   GLUCOSE 90 11/07/2018 0930   BUN 20 11/07/2018 0930   CREATININE 0.96 11/07/2018 0930   CALCIUM 9.3 11/07/2018 0930   GFRNONAA 81.49 04/08/2010 0831   GFRAA 77 05/16/2007 0947  Lipid Panel     Component Value Date/Time   CHOL 180 10/07/2018 1521   TRIG 337.0 (H) 10/07/2018 1521   HDL 33.60 (L) 10/07/2018 1521   CHOLHDL 5 10/07/2018 1521   VLDL 67.4 (H) 10/07/2018 1521   LDLCALC 124 (H) 08/02/2017 1019    CBC    Component Value Date/Time   WBC 8.9 10/07/2018 1521   RBC 4.48 10/07/2018 1521   HGB 14.2 10/07/2018 1521   HCT 42.5 10/07/2018 1521   PLT 260.0 10/07/2018 1521   MCV 94.8 10/07/2018 1521   MCHC 33.4 10/07/2018 1521   RDW 13.5 10/07/2018 1521   LYMPHSABS 3.3 07/04/2013 0827   MONOABS 1.0 07/04/2013 0827   EOSABS 0.5 07/04/2013 0827   BASOSABS 0.1 07/04/2013 0827    Hgb A1C Lab Results  Component Value Date   HGBA1C 5.6 08/02/2017          Assessment & Plan:   Abnormal Weight Gain:  Encouraged low carb, high protein meals Advised her to make sure she is getting at least 1200 calories daily She reports she was told not to exercise by ortho as her knees are so bad  OA of Bilateral Knees:  She is going to see if she can get another injection by ortho Surgery postponed until BMI < 40  Anxiety:  Support offered today Will continue to hold Sertraline while on Phentermine RX for Hydroxyzine 10 mg to take daily as needed- sedation caution given  RTC in 2 months for weight check and med refill Jamie Reaperegina Kaiel Weide, NP

## 2019-04-10 NOTE — Patient Instructions (Signed)

## 2019-05-08 ENCOUNTER — Other Ambulatory Visit: Payer: Self-pay | Admitting: Internal Medicine

## 2019-06-24 ENCOUNTER — Other Ambulatory Visit: Payer: Self-pay

## 2019-06-24 MED ORDER — HYDROXYZINE HCL 10 MG PO TABS: 10.0000 mg | ORAL_TABLET | Freq: Every day | ORAL | 0 refills | Status: DC | PRN

## 2019-06-24 MED ORDER — SUMATRIPTAN SUCCINATE 25 MG PO TABS: ORAL_TABLET | ORAL | 2 refills | Status: DC

## 2019-06-24 NOTE — Telephone Encounter (Signed)
Pt said [facility] said [facility] denied refills for sumatriptan 25 mg # 10 x 2 on 02/28/19 and hydroxyzine 10 mg # 20 on 03/2519 (pt takes due to anxiety of wearing a face mask). Last seen 04/10/19. I advised pt I did not see where those 2 meds had been requested or denied and I would put refill request to Avie Echevaria NP for consideration. Pt voiced understanding.

## 2019-07-08 ENCOUNTER — Other Ambulatory Visit: Payer: Self-pay | Admitting: Internal Medicine

## 2019-09-23 ENCOUNTER — Encounter: Payer: 59 | Admitting: Internal Medicine

## 2019-09-25 ENCOUNTER — Other Ambulatory Visit: Payer: Self-pay | Admitting: Internal Medicine

## 2019-10-30 ENCOUNTER — Other Ambulatory Visit: Payer: Self-pay | Admitting: Internal Medicine

## 2019-10-30 NOTE — Telephone Encounter (Signed)
Last filled 06/24/2019 with 2 refills... please advise... will send overdue CPE letter

## 2019-11-17 ENCOUNTER — Other Ambulatory Visit: Payer: Self-pay | Admitting: Internal Medicine

## 2019-11-17 ENCOUNTER — Ambulatory Visit: Payer: 59 | Attending: Internal Medicine

## 2019-11-17 DIAGNOSIS — Z20822 Contact with and (suspected) exposure to covid-19: Secondary | ICD-10-CM

## 2019-11-17 DIAGNOSIS — J4541 Moderate persistent asthma with (acute) exacerbation: Secondary | ICD-10-CM

## 2019-11-17 DIAGNOSIS — J3089 Other allergic rhinitis: Secondary | ICD-10-CM

## 2019-11-18 LAB — NOVEL CORONAVIRUS, NAA: SARS-CoV-2, NAA: NOT DETECTED

## 2019-12-29 ENCOUNTER — Ambulatory Visit (INDEPENDENT_AMBULATORY_CARE_PROVIDER_SITE_OTHER): Payer: 59 | Admitting: Internal Medicine

## 2019-12-29 ENCOUNTER — Encounter: Payer: Self-pay | Admitting: Internal Medicine

## 2019-12-29 ENCOUNTER — Other Ambulatory Visit: Payer: Self-pay

## 2019-12-29 VITALS — BP 122/84 | HR 89 | Temp 98.3°F | Ht 63.75 in | Wt 280.0 lb

## 2019-12-29 DIAGNOSIS — M170 Bilateral primary osteoarthritis of knee: Secondary | ICD-10-CM

## 2019-12-29 DIAGNOSIS — M179 Osteoarthritis of knee, unspecified: Secondary | ICD-10-CM | POA: Insufficient documentation

## 2019-12-29 DIAGNOSIS — Z0000 Encounter for general adult medical examination without abnormal findings: Secondary | ICD-10-CM

## 2019-12-29 DIAGNOSIS — F411 Generalized anxiety disorder: Secondary | ICD-10-CM | POA: Diagnosis not present

## 2019-12-29 DIAGNOSIS — N3946 Mixed incontinence: Secondary | ICD-10-CM

## 2019-12-29 DIAGNOSIS — K219 Gastro-esophageal reflux disease without esophagitis: Secondary | ICD-10-CM

## 2019-12-29 DIAGNOSIS — M1710 Unilateral primary osteoarthritis, unspecified knee: Secondary | ICD-10-CM | POA: Insufficient documentation

## 2019-12-29 DIAGNOSIS — G43C1 Periodic headache syndromes in child or adult, intractable: Secondary | ICD-10-CM

## 2019-12-29 DIAGNOSIS — J4530 Mild persistent asthma, uncomplicated: Secondary | ICD-10-CM | POA: Diagnosis not present

## 2019-12-29 MED ORDER — SERTRALINE HCL 25 MG PO TABS: 25.0000 mg | ORAL_TABLET | Freq: Every day | ORAL | 3 refills | Status: DC

## 2019-12-29 NOTE — Assessment & Plan Note (Signed)
Continue Famotidine as needed CBC and CMET today

## 2019-12-29 NOTE — Assessment & Plan Note (Signed)
Avoid triggers Continue Imitrex as needed 

## 2019-12-29 NOTE — Progress Notes (Signed)
Subjective:    Patient ID: Jamie Rowland, female    DOB: 1961/01/20, 59 y.o.   MRN: [id number]  HPI  Pt presents to the clinic today for her annual exam. She is also due to follow up chronic conditions.  Asthma: Moderate, persistent. Managed on Symbicort and Albuterol. There are no PFT's on file.  Migraines: These occur 1-2 times per month. She takes Imitrex as needed. She is not following with neurology.  GERD: She is not sure what triggers this, maybe soda. She is taking Famotidine as needed. There is no upper GI on file.  OA: Mainly in her knees. She takes Diclofenac as prescribed..   OAB: Managed on Ditropan. She does not follow with urology.  Anxiety: Deteriorated. She would like to restart Sertraline. She reports the Hydroxyzine made her too drowsy to take on a regular basis. She is not currently seeing a therapist. She denies SI/HI.  Flu: 07/2018 Tetanus: 07/2016 Pneumovax: 07/2016 Pap Smear: 02/2015 Mammogram: 03/2011 Colon Screening: 11/2013 Vision Screening: annually Dentist: biannually  Diet: She does eat some meat. She consumes fruits and veggies. She tries to avoid fried foods. She drinks mostly water and sweet tea. Exercise: None  Review of Systems      Past Medical History:  Diagnosis Date  . Asthma   . Bronchitis   . Migraine headache   . Seasonal allergies     Current Outpatient Medications  Medication Sig Dispense Refill  . albuterol (PROVENTIL HFA;VENTOLIN HFA) 108 (90 Base) MCG/ACT inhaler Inhale 2 puffs into the lungs every 6 (six) hours as needed for wheezing or shortness of breath. 1 Inhaler 2  . budesonide-formoterol (SYMBICORT) 80-4.5 MCG/ACT inhaler INHALE 2 PUFFS EVERY 12 HOURS TO PREVENT COUGH/WHEEZE. RINSE, GARGLE, AND SPIT AFTER USE. 10.2 g 0  . diclofenac (VOLTAREN) 75 MG EC tablet Take 1 tablet (75 mg total) by mouth 2 (two) times daily. MUST SCHEDULE PHYSICAL EXAM 60 tablet 0  . famotidine (PEPCID) 20 MG tablet Take 1 tablet (20 mg  total) by mouth 2 (two) times daily. One at bedtime    . hydrOXYzine (ATARAX/VISTARIL) 10 MG tablet Take 1 tablet (10 mg total) by mouth daily as needed. 20 tablet 0  . montelukast (SINGULAIR) 10 MG tablet TAKE 1 TABLET BY MOUTH EVERY NIGHT AT BEDTIME. 30 tablet 0  . oxybutynin (DITROPAN XL) 15 MG 24 hr tablet TAKE 1 TABLET BY MOUTH AT BEDTIME. 30 tablet 0  . phentermine 37.5 MG capsule Take 1 capsule (37.5 mg total) by mouth every morning. 30 capsule 1  . SUMAtriptan (IMITREX) 25 MG tablet TAKE 1 TABLET BY MOUTH ONCE WHEN NEEDED FOR MIGRAINE. MAY REPEAT IN 2 HOURS IF HEADACHE PERSISTS OR RECURS. 10 tablet 2   No current facility-administered medications for this visit.    Allergies  Allergen Reactions  . Codeine Sulfate     REACTION: Hives, can't breathe  . Latex Hives    Family History  Problem Relation Age of Onset  . Hypertension Mother   . Diverticulitis Mother   . Fibromyalgia Mother   . Heart failure Mother   . Cancer Sister        uterine  . Diabetes Maternal Uncle   . Heart disease Maternal Uncle   . Asthma Maternal Grandmother   . Heart disease Maternal Grandmother   . Emphysema Maternal Grandmother        smoked  . Colon cancer Neg Hx   . Esophageal cancer Neg Hx   . Rectal cancer  Neg Hx   . Stomach cancer Neg Hx     Social History   Socioeconomic History  . Marital status: Married    Spouse name: Not on file  . Number of children: 3  . Years of education: Not on file  . Highest education level: Not on file  Occupational History  . 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 Smoking status: Never Smoker  . Smokeless tobacco: Never Used  Substance and Sexual Activity  . Alcohol use: No  . Drug use: No  . Sexual activity: Yes  Other Topics Concern  . 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 Difficulty of Paying Living Expenses:   Food Insecurity:   . Worried About Programme researcher, broadcasting/film/video in the Last Year:   . Barista in the Last Year:   Transportation Needs:   . Freight forwarder (Medical):   Marland Kitchen Lack of Transportation (Non-Medical):   Physical Activity:   . Days of Exercise per Week:   . Minutes of Exercise per Session:   Stress:   . Feeling of Stress :   Social Connections:   . Frequency of Communication with Friends and Family:   . Frequency of Social Gatherings with Friends and Family:   . Attends Religious Services:   . Active Member of Clubs or Organizations:   . Attends Banker Meetings:   Marland Kitchen Marital Status:   Intimate Partner Violence:   . Fear of Current or Ex-Partner:   . Emotionally Abused:   Marland Kitchen Physically Abused:   . Sexually Abused:      Constitutional: Denies fever, malaise, fatigue, headache or abrupt weight changes.  HEENT: Denies eye pain, eye redness, ear pain, ringing in the ears, wax buildup, runny nose, nasal congestion, bloody nose, or sore throat. Respiratory: Pt reports intermittent cough. Denies difficulty breathing, shortness of breath, or sputum production.   Cardiovascular: Denies chest pain, chest tightness, palpitations or swelling in the hands or feet.  Gastrointestinal: Denies abdominal pain, bloating, constipation, diarrhea or blood in the stool.  GU: Pt reports urinary frequency. Denies urgency, pain with urination, burning sensation, blood in urine, odor or discharge. Musculoskeletal: Pt reports bilateral knee pani. Denies decrease in range of motion, difficulty with gait, muscle pain or joint swelling.  Skin: Denies redness, rashes, lesions or ulcercations.  Neurological: Denies dizziness, difficulty with memory, difficulty with speech or problems with balance and coordination.  Psych: Pt reports anxiety. Denies depression,  SI/HI.  No other specific complaints in a complete review of systems (except as listed in HPI above).  Objective:   Physical Exam   BP 122/84   Pulse 89   Temp 98.3 F (36.8 C) (Temporal)   Ht 5' 3.75" (1.619 m)   Wt 280 lb (127 kg)   SpO2 99%   BMI 48.44 kg/m   Wt Readings from Last 3 Encounters:  04/10/19 268 lb (121.6 kg)  12/05/18 262 lb (118.8 kg)  10/07/18 266 lb (120.7 kg)    General: Appears her stated age, obese, in NAD. Skin: Warm, dry and intact. No rashes, lesions or ulcerations noted. HEENT: Head: normal shape and size; Eyes: sclera white, no icterus, conjunctiva pink, PERRLA  and EOMs intact;  Neck:  Neck supple, trachea midline. No masses, lumps or thyromegaly present.  Cardiovascular: Normal rate and rhythm. S1,S2 noted.  No murmur, rubs or gallops noted. No JVD or BLE edema. No carotid bruits noted. Pulmonary/Chest: Normal effort and positive vesicular breath sounds. No respiratory distress. No wheezes, rales or ronchi noted.  Abdomen: Soft and nontender. Normal bowel sounds. No distention or masses noted. Liver, spleen and kidneys non palpable. Musculoskeletal: Strength 5/5 BUE/BLE. No difficulty with gait.  Neurological: Alert and oriented. Cranial nerves II-XII grossly intact. Coordination normal.  Psychiatric: Mood and affect normal. Behavior is normal. Judgment and thought content normal.     BMET    Component Value Date/Time   NA 141 11/07/2018 0930   K 3.7 11/07/2018 0930   CL 106 11/07/2018 0930   CO2 29 11/07/2018 0930   GLUCOSE 90 11/07/2018 0930   BUN 20 11/07/2018 0930   CREATININE 0.96 11/07/2018 0930   CALCIUM 9.3 11/07/2018 0930   GFRNONAA 81.49 04/08/2010 0831   GFRAA 77 05/16/2007 0947    Lipid Panel     Component Value Date/Time   CHOL 180 10/07/2018 1521   TRIG 337.0 (H) 10/07/2018 1521   HDL 33.60 (L) 10/07/2018 1521   CHOLHDL 5 10/07/2018 1521   VLDL 67.4 (H) 10/07/2018 1521   LDLCALC 124 (H) 08/02/2017 1019    CBC     Component Value Date/Time   WBC 8.9 10/07/2018 1521   RBC 4.48 10/07/2018 1521   HGB 14.2 10/07/2018 1521   HCT 42.5 10/07/2018 1521   PLT 260.0 10/07/2018 1521   MCV 94.8 10/07/2018 1521   MCHC 33.4 10/07/2018 1521   RDW 13.5 10/07/2018 1521   LYMPHSABS 3.3 07/04/2013 0827   MONOABS 1.0 07/04/2013 0827   EOSABS 0.5 07/04/2013 0827   BASOSABS 0.1 07/04/2013 0827    Hgb A1C Lab Results  Component Value Date   HGBA1C 5.6 08/02/2017          Assessment & Plan:   Preventative Health Maintenance:  Encouraged her to get a flu shot in the fall Tetanus UTD Pap Smear- will request copy Mammogram ordered, she will call to schedule Colon screening UTD Encouraged him to consume a balanced diet and exercise regimen Advised her to see an eye doctor and dentist annually Will check CBC, CMET, Lipid, A1C and Vit D today  RTC in 1 year, sooner if needed  Webb Silversmith, NP This visit occurred during the SARS-CoV-2 public health emergency.  Safety protocols were in place, including screening questions prior to the visit, additional usage of staff PPE, and extensive cleaning of exam room while observing appropriate contact time as indicated for disinfecting solutions.

## 2019-12-29 NOTE — Assessment & Plan Note (Signed)
Referral to pulmonology placed for further evaluation Continue Symbicort and Albuterol

## 2019-12-29 NOTE — Assessment & Plan Note (Signed)
Will restart Sertraline Support offered today

## 2019-12-29 NOTE — Assessment & Plan Note (Signed)
Encouraged weight loss Continue Diclofenac CMET today

## 2019-12-29 NOTE — Patient Instructions (Signed)

## 2019-12-29 NOTE — Assessment & Plan Note (Signed)
Continue Ditropan Will monitor 

## 2019-12-30 ENCOUNTER — Telehealth: Payer: Self-pay

## 2019-12-30 LAB — CBC
HCT: 41.5 % (ref 36.0–46.0)
Hemoglobin: 14 g/dL (ref 12.0–15.0)
MCHC: 33.8 g/dL (ref 30.0–36.0)
MCV: 94.6 fl (ref 78.0–100.0)
Platelets: 274 10*3/uL (ref 150.0–400.0)
RBC: 4.39 Mil/uL (ref 3.87–5.11)
RDW: 13.4 % (ref 11.5–15.5)
WBC: 11.3 10*3/uL — ABNORMAL HIGH (ref 4.0–10.5)

## 2019-12-30 LAB — LDL CHOLESTEROL, DIRECT: Direct LDL: 134 mg/dL

## 2019-12-30 LAB — LIPID PANEL
Cholesterol: 188 mg/dL (ref 0–200)
HDL: 36.3 mg/dL — ABNORMAL LOW (ref >39.00)
NonHDL: 151.47
Total CHOL/HDL Ratio: 5
Triglycerides: 281 mg/dL — ABNORMAL HIGH (ref 0.0–149.0)
VLDL: 56.2 mg/dL — ABNORMAL HIGH (ref 0.0–40.0)

## 2019-12-30 LAB — COMPREHENSIVE METABOLIC PANEL
ALT: 23 U/L (ref 0–35)
AST: 15 U/L (ref 0–37)
Albumin: 3.9 g/dL (ref 3.5–5.2)
Alkaline Phosphatase: 72 U/L (ref 39–117)
BUN: 20 mg/dL (ref 6–23)
CO2: 28 mEq/L (ref 19–32)
Calcium: 9.5 mg/dL (ref 8.4–10.5)
Chloride: 103 mEq/L (ref 96–112)
Creatinine, Ser: 0.88 mg/dL (ref 0.40–1.20)
GFR: 66.14 mL/min (ref >60.00)
Glucose, Bld: 126 mg/dL — ABNORMAL HIGH (ref 70–99)
Potassium: 4.2 mEq/L (ref 3.5–5.1)
Sodium: 138 mEq/L (ref 135–145)
Total Bilirubin: 0.3 mg/dL (ref 0.2–1.2)
Total Protein: 7.4 g/dL (ref 6.0–8.3)

## 2019-12-30 LAB — VITAMIN D 25 HYDROXY (VIT D DEFICIENCY, FRACTURES): VITD: 29.45 ng/mL — ABNORMAL LOW (ref 30.00–100.00)

## 2019-12-30 LAB — HEMOGLOBIN A1C: Hgb A1c MFr Bld: 6 % (ref 4.6–6.5)

## 2019-12-30 NOTE — Telephone Encounter (Signed)
Pt left v/m requesting refill on her meds; pt said she had a CPX on 12/29/19 and refills were not done. Pt said she forgot to ask.

## 2019-12-31 ENCOUNTER — Encounter: Payer: Self-pay | Admitting: Internal Medicine

## 2020-01-01 ENCOUNTER — Encounter: Payer: Self-pay | Admitting: Internal Medicine

## 2020-01-01 DIAGNOSIS — J3089 Other allergic rhinitis: Secondary | ICD-10-CM

## 2020-01-01 MED ORDER — OXYBUTYNIN CHLORIDE ER 15 MG PO TB24: 15.0000 mg | ORAL_TABLET | Freq: Every day | ORAL | 2 refills | Status: DC

## 2020-01-01 MED ORDER — DICLOFENAC SODIUM 75 MG PO TBEC: 75.0000 mg | DELAYED_RELEASE_TABLET | Freq: Two times a day (BID) | ORAL | 2 refills | Status: DC

## 2020-01-01 MED ORDER — MONTELUKAST SODIUM 10 MG PO TABS: 10.0000 mg | ORAL_TABLET | Freq: Every day | ORAL | 2 refills | Status: DC

## 2020-01-28 ENCOUNTER — Other Ambulatory Visit: Payer: Self-pay | Admitting: Internal Medicine

## 2020-01-28 DIAGNOSIS — J4541 Moderate persistent asthma with (acute) exacerbation: Secondary | ICD-10-CM

## 2020-03-24 ENCOUNTER — Other Ambulatory Visit: Payer: Self-pay | Admitting: Internal Medicine

## 2020-03-25 NOTE — Telephone Encounter (Signed)
Last filled 10/31/2019 with 2 refills.... please advise 

## 2020-03-26 DIAGNOSIS — M170 Bilateral primary osteoarthritis of knee: Secondary | ICD-10-CM | POA: Diagnosis not present

## 2020-03-26 DIAGNOSIS — M25562 Pain in left knee: Secondary | ICD-10-CM | POA: Diagnosis not present

## 2020-03-26 DIAGNOSIS — M25561 Pain in right knee: Secondary | ICD-10-CM | POA: Diagnosis not present

## 2020-04-12 ENCOUNTER — Other Ambulatory Visit: Payer: Self-pay | Admitting: Internal Medicine

## 2020-04-12 DIAGNOSIS — E782 Mixed hyperlipidemia: Secondary | ICD-10-CM

## 2020-04-13 ENCOUNTER — Other Ambulatory Visit (INDEPENDENT_AMBULATORY_CARE_PROVIDER_SITE_OTHER): Payer: BC Managed Care – PPO

## 2020-04-13 DIAGNOSIS — E782 Mixed hyperlipidemia: Secondary | ICD-10-CM | POA: Diagnosis not present

## 2020-04-13 LAB — LIPID PANEL
Cholesterol: 189 mg/dL (ref 0–200)
HDL: 39.3 mg/dL (ref >39.00)
LDL Cholesterol: 124 mg/dL — ABNORMAL HIGH (ref 0–99)
NonHDL: 150.19
Total CHOL/HDL Ratio: 5
Triglycerides: 131 mg/dL (ref 0.0–149.0)
VLDL: 26.2 mg/dL (ref 0.0–40.0)

## 2020-04-22 ENCOUNTER — Other Ambulatory Visit: Payer: Self-pay | Admitting: Internal Medicine

## 2020-04-22 DIAGNOSIS — J4530 Mild persistent asthma, uncomplicated: Secondary | ICD-10-CM

## 2020-04-23 ENCOUNTER — Encounter: Payer: Self-pay | Admitting: Internal Medicine

## 2020-05-19 ENCOUNTER — Other Ambulatory Visit: Payer: Self-pay | Admitting: Internal Medicine

## 2020-06-02 ENCOUNTER — Ambulatory Visit
Admission: RE | Admit: 2020-06-02 | Discharge: 2020-06-02 | Disposition: A | Discharge status: Home / Self Care | Payer: BC Managed Care – PPO | Source: Ambulatory Visit | Attending: Internal Medicine | Admitting: Internal Medicine

## 2020-06-02 ENCOUNTER — Other Ambulatory Visit: Payer: Self-pay

## 2020-06-02 DIAGNOSIS — Z1231 Encounter for screening mammogram for malignant neoplasm of breast: Secondary | ICD-10-CM | POA: Insufficient documentation

## 2020-06-02 DIAGNOSIS — Z0000 Encounter for general adult medical examination without abnormal findings: Secondary | ICD-10-CM

## 2020-06-07 ENCOUNTER — Institutional Professional Consult (permissible substitution): Payer: Self-pay | Admitting: Pulmonary Disease

## 2020-07-02 DIAGNOSIS — M170 Bilateral primary osteoarthritis of knee: Secondary | ICD-10-CM | POA: Diagnosis not present

## 2020-09-21 ENCOUNTER — Other Ambulatory Visit: Payer: Self-pay | Admitting: Internal Medicine

## 2020-10-06 ENCOUNTER — Other Ambulatory Visit: Payer: Self-pay | Admitting: Internal Medicine

## 2020-10-12 ENCOUNTER — Other Ambulatory Visit: Payer: Self-pay | Admitting: Internal Medicine

## 2020-10-13 NOTE — Telephone Encounter (Signed)
Last filled 03/25/2020 with 2 refills... please advise

## 2020-10-21 ENCOUNTER — Other Ambulatory Visit: Payer: Self-pay | Admitting: Internal Medicine

## 2020-12-08 ENCOUNTER — Other Ambulatory Visit: Payer: Self-pay | Admitting: Internal Medicine

## 2020-12-09 ENCOUNTER — Other Ambulatory Visit: Payer: Self-pay | Admitting: Internal Medicine

## 2020-12-16 DIAGNOSIS — M170 Bilateral primary osteoarthritis of knee: Secondary | ICD-10-CM | POA: Diagnosis not present

## 2021-01-06 ENCOUNTER — Other Ambulatory Visit: Payer: Self-pay

## 2021-01-06 ENCOUNTER — Ambulatory Visit (INDEPENDENT_AMBULATORY_CARE_PROVIDER_SITE_OTHER): Payer: BC Managed Care – PPO | Admitting: Internal Medicine

## 2021-01-06 ENCOUNTER — Encounter: Payer: Self-pay | Admitting: Internal Medicine

## 2021-01-06 VITALS — BP 128/84 | HR 84 | Temp 97.8°F | Ht 64.0 in | Wt 270.0 lb

## 2021-01-06 DIAGNOSIS — N3946 Mixed incontinence: Secondary | ICD-10-CM

## 2021-01-06 DIAGNOSIS — Z1159 Encounter for screening for other viral diseases: Secondary | ICD-10-CM | POA: Diagnosis not present

## 2021-01-06 DIAGNOSIS — Z0000 Encounter for general adult medical examination without abnormal findings: Secondary | ICD-10-CM | POA: Diagnosis not present

## 2021-01-06 DIAGNOSIS — G43C1 Periodic headache syndromes in child or adult, intractable: Secondary | ICD-10-CM

## 2021-01-06 DIAGNOSIS — M170 Bilateral primary osteoarthritis of knee: Secondary | ICD-10-CM

## 2021-01-06 DIAGNOSIS — F411 Generalized anxiety disorder: Secondary | ICD-10-CM | POA: Diagnosis not present

## 2021-01-06 DIAGNOSIS — J4530 Mild persistent asthma, uncomplicated: Secondary | ICD-10-CM | POA: Diagnosis not present

## 2021-01-06 DIAGNOSIS — Z0001 Encounter for general adult medical examination with abnormal findings: Secondary | ICD-10-CM

## 2021-01-06 DIAGNOSIS — Z23 Encounter for immunization: Secondary | ICD-10-CM | POA: Diagnosis not present

## 2021-01-06 DIAGNOSIS — K219 Gastro-esophageal reflux disease without esophagitis: Secondary | ICD-10-CM | POA: Diagnosis not present

## 2021-01-06 DIAGNOSIS — Z114 Encounter for screening for human immunodeficiency virus [HIV]: Secondary | ICD-10-CM | POA: Diagnosis not present

## 2021-01-06 LAB — COMPREHENSIVE METABOLIC PANEL
ALT: 20 U/L (ref 0–35)
AST: 12 U/L (ref 0–37)
Albumin: 4.2 g/dL (ref 3.5–5.2)
Alkaline Phosphatase: 68 U/L (ref 39–117)
BUN: 23 mg/dL (ref 6–23)
CO2: 31 mEq/L (ref 19–32)
Calcium: 9.6 mg/dL (ref 8.4–10.5)
Chloride: 104 mEq/L (ref 96–112)
Creatinine, Ser: 0.93 mg/dL (ref 0.40–1.20)
GFR: 67.81 mL/min (ref >60.00)
Glucose, Bld: 99 mg/dL (ref 70–99)
Potassium: 4.5 mEq/L (ref 3.5–5.1)
Sodium: 141 mEq/L (ref 135–145)
Total Bilirubin: 0.3 mg/dL (ref 0.2–1.2)
Total Protein: 7.2 g/dL (ref 6.0–8.3)

## 2021-01-06 LAB — CBC
HCT: 42.8 % (ref 36.0–46.0)
Hemoglobin: 14.4 g/dL (ref 12.0–15.0)
MCHC: 33.5 g/dL (ref 30.0–36.0)
MCV: 93.4 fl (ref 78.0–100.0)
Platelets: 265 10*3/uL (ref 150.0–400.0)
RBC: 4.59 Mil/uL (ref 3.87–5.11)
RDW: 13.6 % (ref 11.5–15.5)
WBC: 9.8 10*3/uL (ref 4.0–10.5)

## 2021-01-06 LAB — LIPID PANEL
Cholesterol: 201 mg/dL — ABNORMAL HIGH (ref 0–200)
HDL: 37.3 mg/dL — ABNORMAL LOW (ref >39.00)
NonHDL: 163.28
Total CHOL/HDL Ratio: 5
Triglycerides: 262 mg/dL — ABNORMAL HIGH (ref 0.0–149.0)
VLDL: 52.4 mg/dL — ABNORMAL HIGH (ref 0.0–40.0)

## 2021-01-06 LAB — LDL CHOLESTEROL, DIRECT: Direct LDL: 132 mg/dL

## 2021-01-06 LAB — TSH: TSH: 3.47 u[IU]/mL (ref 0.35–4.50)

## 2021-01-06 LAB — HEMOGLOBIN A1C: Hgb A1c MFr Bld: 6.2 % (ref 4.6–6.5)

## 2021-01-06 NOTE — Assessment & Plan Note (Signed)
Encouraged ongoing weight loss Continue Diclofenac CMET today She will continue to follow with orthopedics

## 2021-01-06 NOTE — Assessment & Plan Note (Signed)
Controlled with Imitrex as needed Will monitor

## 2021-01-06 NOTE — Assessment & Plan Note (Signed)
Continue Ditropan Encouraged Kegel exercises

## 2021-01-06 NOTE — Assessment & Plan Note (Signed)
Stable on Sertraline Support offered  Will monitor

## 2021-01-06 NOTE — Progress Notes (Signed)
Subjective:    Patient ID: Jamie Rowland, female    DOB: October 23, 1960, 60 y.o.   MRN: [id number]  HPI  Patient presents the clinic today for her annual exam.  She is also due to follow-up chronic conditions.  Asthma: Moderate, persistent.  Managed on Albuterol as needed.  There are no PFTs on file.  She no longer follows with pulmonology.  Migraines: These occur 1-2 times per month.  She takes Imitrex as needed with good relief of symptoms.  She does not follow with neurology.  GERD: Managed with Famotidine as needed.  There is no upper GI on file.  OA: Mainly in her knees. She is undergoing workup for bilateral knee replacements but has to los weight first. She has already lost 15 lbs. She takes Diclofenac as prescribed.  She is  currently following with orthopedics.  OAB: Mainly urinary urgency.  Managed on Ditropan.  She does not follow with urology.  Anxiety: Persistent.  Managed on Sertraline.  She is not currently seeing a therapist.  She denies depression, SI/HI.  Flu: 07/2018 Tetanus: 07/2016 COVID: Pfizer x 2 Pneumovax: 07/2016 Pap smear: ? 2019 Mammogram: 05/2020 Colon screening: 11/2013 Vision screening: yearly Dentist: biannually  Diet: She does eat some meat. She consumes fruits and veggies. She tries to avoid fried foods. She drinks mostly water. Exercise: None  Review of Systems  Past Medical History:  Diagnosis Date  . Asthma   . Bronchitis   . Migraine headache   . Seasonal allergies     Current Outpatient Medications  Medication Sig Dispense Refill  . albuterol (VENTOLIN HFA) 108 (90 Base) MCG/ACT inhaler INHALE 2 PUFFS INTO THE LUNGS EVERY 6 HOURS AS NEEDED FOR WHEEZING OR SHORTNESS OF BREATH. 8.5 g 2  . budesonide-formoterol (SYMBICORT) 80-4.5 MCG/ACT inhaler INHALE 2 PUFFS EVERY 12 HOURS TO PREVENT COUGH/WHEEZE. RINSE, GARGLE, AND SPIT AFTER USE. 10.2 g 5  . diclofenac (VOLTAREN) 75 MG EC tablet TAKE 1 TABLET BY MOUTH 2 TIMES DAILY. 60 tablet 1  .  famotidine (PEPCID) 20 MG tablet Take 1 tablet (20 mg total) by mouth 2 (two) times daily. One at bedtime    . montelukast (SINGULAIR) 10 MG tablet Take 1 tablet (10 mg total) by mouth at bedtime. 90 tablet 2  . oxybutynin (DITROPAN XL) 15 MG 24 hr tablet Take 1 tablet (15 mg total) by mouth at bedtime. 30 tablet 1  . sertraline (ZOLOFT) 25 MG tablet Take 1 tablet (25 mg total) by mouth daily. 90 tablet 3  . SUMAtriptan (IMITREX) 25 MG tablet TAKE 1 TABLET BY MOUTH ONCE WHEN NEEDED FOR MIGRAINE. MAY REPEAT IN 2 HOURS IF HEADACHE PERSISTS OR RECURS. 10 tablet 2   No current facility-administered medications for this visit.    Allergies  Allergen Reactions  . Codeine Sulfate     REACTION: Hives, can't breathe  . Latex Hives    Family History  Problem Relation Age of Onset  . Hypertension Mother   . Diverticulitis Mother   . Fibromyalgia Mother   . Heart failure Mother   . Cancer Sister        uterine  . Diabetes Maternal Uncle   . Heart disease Maternal Uncle   . Asthma Maternal Grandmother   . Heart disease Maternal Grandmother   . Emphysema Maternal Grandmother        smoked  . Colon cancer Neg Hx   . Esophageal cancer Neg Hx   . Rectal cancer Neg Hx   .  Stomach cancer Neg Hx   . Breast cancer Neg Hx     Social History   Socioeconomic History  . Marital status: Married    Spouse name: Not on file  . Number of children: 3  . Years of education: Not on file  . Highest education level: Not on file  Occupational History  . 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 Smoking status: Never Smoker  . Smokeless tobacco: Never Used  Substance and Sexual Activity  . Alcohol use: No  . Drug use: No  . Sexual activity: Yes  Other Topics Concern  . 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 Denies fever, malaise, fatigue, or abrupt weight changes.  HEENT: Denies eye pain, eye redness, ear pain, ringing in the ears, wax buildup, runny nose, nasal congestion, bloody nose, or sore throat. Respiratory: Denies difficulty breathing, shortness of breath, cough or sputum production.   Cardiovascular: Denies chest pain, chest tightness, palpitations or swelling in the hands or feet.  Gastrointestinal: Pt reports intermittent reflux. Denies abdominal pain, bloating, constipation, diarrhea or blood in the stool.  GU: Pt reports urinary urgency. Denies, frequency, pain with urination, burning sensation, blood in urine, odor or discharge. Musculoskeletal: Pt reports bilateral knee pain. Denies decrease in range of motion, difficulty with gait, muscle pain or joint swelling.  Skin: Denies redness, rashes, lesions or ulcercations.  Neurological: Denies dizziness, difficulty with memory, difficulty with speech or problems with balance and coordination.  Psych: Pt has a history of anxiety. Denies depression, SI/HI.  No other specific complaints in a complete review of systems (except as listed in HPI above).     Objective:   Physical Exam  BP 128/84   Pulse 84   Temp 97.8 F (36.6 C) (Temporal)   Ht 5\' 4"  (1.626 m)   Wt 270 lb (122.5 kg)   SpO2 98%   BMI 46.35 kg/m   Wt Readings from Last 3 Encounters:  12/29/19 280 lb (127 kg)  04/10/19 268 lb (121.6 kg)  12/05/18 262 lb (118.8 kg)    General: Appears her stated age,obese, in NAD. Skin: Warm, dry and intact. No rashes noted. HEENT: Head: normal shape and size; Eyes: sclera white, no icterus, conjunctiva  pink, PERRLA and EOMs intact;  Cardiovascular: Normal rate and rhythm. S1,S2 noted.  No murmur, rubs or gallops noted. No JVD or BLE edema. No carotid bruits noted. Pulmonary/Chest: Normal effort and positive vesicular breath sounds. No respiratory distress. No wheezes, rales or ronchi noted.  Abdomen: Soft and nontender. Normal bowel sounds. No distention or masses noted. Liver, spleen and kidneys non palpable. Musculoskeletal: Strength 5/5 BUE/BLE. Joint enlargement noted of right knee but no signs of joint swelling. No difficulty with gait.  Neurological: Alert and oriented. Cranial nerves II-XII grossly intact. Coordination normal.  Psychiatric: Mood and affect normal. Behavior is normal. Judgment and thought content normal.     BMET    Component Value Date/Time  NA 138 12/29/2019 1550   K 4.2 12/29/2019 1550   CL 103 12/29/2019 1550   CO2 28 12/29/2019 1550   GLUCOSE 126 (H) 12/29/2019 1550   BUN 20 12/29/2019 1550   CREATININE 0.88 12/29/2019 1550   CALCIUM 9.5 12/29/2019 1550   GFRNONAA 81.49 04/08/2010 0831   GFRAA 77 05/16/2007 0947    Lipid Panel     Component Value Date/Time   CHOL 189 04/13/2020 0837   TRIG 131.0 04/13/2020 0837   HDL 39.30 04/13/2020 0837   CHOLHDL 5 04/13/2020 0837   VLDL 26.2 04/13/2020 0837   LDLCALC 124 (H) 04/13/2020 0837    CBC    Component Value Date/Time   WBC 11.3 (H) 12/29/2019 1550   RBC 4.39 12/29/2019 1550   HGB 14.0 12/29/2019 1550   HCT 41.5 12/29/2019 1550   PLT 274.0 12/29/2019 1550   MCV 94.6 12/29/2019 1550   MCHC 33.8 12/29/2019 1550   RDW 13.4 12/29/2019 1550   LYMPHSABS 3.3 07/04/2013 0827   MONOABS 1.0 07/04/2013 0827   EOSABS 0.5 07/04/2013 0827   BASOSABS 0.1 07/04/2013 0827    Hgb A1C Lab Results  Component Value Date   HGBA1C 6.0 12/29/2019             Assessment & Plan:   Preventative Health Maintenance:  Flu shot today Tetanus UTD Pneumovax UTD Encouraged her to get her covid  booster Pap smear  UTD- will request copy Mammogram UTD Colon screening UTD Encouraged her to consume a balanced diet and exercise regimen Advised her to consume a balanced diet and exercise regimen  Advised her to see an eye doctor and dentist annually Will check CBC, CMET, TSH, Lipid and A1C today  RTC in 1 year, sooner if needed Nicki Reaper, NP This visit occurred during the SARS-CoV-2 public health emergency.  Safety protocols were in place, including screening questions prior to the visit, additional usage of staff PPE, and extensive cleaning of exam room while observing appropriate contact time as indicated for disinfecting solutions.

## 2021-01-06 NOTE — Assessment & Plan Note (Signed)
Avoid triggers Weight loss can help reduce reflux symptoms Continue Famotidine as needed

## 2021-01-06 NOTE — Patient Instructions (Signed)

## 2021-01-06 NOTE — Assessment & Plan Note (Signed)
Now off Symbicort Continue Albuterol as needed

## 2021-01-07 ENCOUNTER — Encounter: Payer: Self-pay | Admitting: Internal Medicine

## 2021-01-07 DIAGNOSIS — R7303 Prediabetes: Secondary | ICD-10-CM

## 2021-01-07 DIAGNOSIS — E7800 Pure hypercholesterolemia, unspecified: Secondary | ICD-10-CM

## 2021-01-11 NOTE — Telephone Encounter (Signed)
Patient called in regarding cholesterol medication. Please advise.

## 2021-01-12 MED ORDER — SIMVASTATIN 10 MG PO TABS: 10.0000 mg | ORAL_TABLET | Freq: Every day | ORAL | 2 refills | Status: DC

## 2021-01-12 NOTE — Addendum Note (Signed)
Addended by: Littie Deeds Y on: 01/12/2021 05:00 PM   Modules accepted: Orders

## 2021-01-12 NOTE — Addendum Note (Signed)
Addended by: Lorre Munroe on: 01/12/2021 11:57 PM   Modules accepted: Orders

## 2021-01-12 NOTE — Telephone Encounter (Signed)
Simvastatin and future labs ordered.

## 2021-01-13 ENCOUNTER — Other Ambulatory Visit: Payer: Self-pay | Admitting: Internal Medicine

## 2021-01-13 DIAGNOSIS — G43C1 Periodic headache syndromes in child or adult, intractable: Secondary | ICD-10-CM

## 2021-01-13 DIAGNOSIS — N3946 Mixed incontinence: Secondary | ICD-10-CM

## 2021-01-13 DIAGNOSIS — Z0000 Encounter for general adult medical examination without abnormal findings: Secondary | ICD-10-CM

## 2021-01-13 DIAGNOSIS — J3089 Other allergic rhinitis: Secondary | ICD-10-CM

## 2021-01-13 DIAGNOSIS — K219 Gastro-esophageal reflux disease without esophagitis: Secondary | ICD-10-CM

## 2021-02-12 ENCOUNTER — Other Ambulatory Visit: Payer: Self-pay | Admitting: Internal Medicine

## 2021-02-14 ENCOUNTER — Other Ambulatory Visit: Payer: Self-pay

## 2021-02-14 MED ORDER — OXYBUTYNIN CHLORIDE ER 15 MG PO TB24: 15.0000 mg | ORAL_TABLET | Freq: Every day | ORAL | 2 refills | Status: DC

## 2021-02-14 MED ORDER — DICLOFENAC SODIUM 75 MG PO TBEC: 75.0000 mg | DELAYED_RELEASE_TABLET | Freq: Two times a day (BID) | ORAL | 2 refills | Status: DC

## 2021-05-05 ENCOUNTER — Other Ambulatory Visit: Payer: Self-pay | Admitting: Internal Medicine

## 2021-06-01 ENCOUNTER — Other Ambulatory Visit: Payer: Self-pay | Admitting: Internal Medicine

## 2021-06-02 NOTE — Telephone Encounter (Signed)
Refill was sent in 8.5g with 2 rf 4 weeks ago. Patient has not made follow up with anyone to TOC. Ok to refill?

## 2021-06-15 ENCOUNTER — Telehealth: Payer: Self-pay

## 2021-06-15 NOTE — Telephone Encounter (Signed)
Left message for patient to call back to follow up if patient is staying with [facility] or following Nicki Reaper.  Per chart she has not read mychart message from earlier this month in regards to this question

## 2021-09-19 ENCOUNTER — Other Ambulatory Visit: Payer: Self-pay | Admitting: Internal Medicine

## 2021-09-20 NOTE — Telephone Encounter (Signed)
Called [name] and [email or web address] to see if she is staying with [facility] or following Rene Kocher to [facility]

## 2021-09-20 NOTE — Telephone Encounter (Signed)
Pt called stating she is following Rene Koc[facility]. Pt states taht everything should of been transferred to [facility].

## 2021-09-30 ENCOUNTER — Ambulatory Visit
Admission: EM | Admit: 2021-09-30 | Discharge: 2021-09-30 | Disposition: A | Discharge status: Home / Self Care | Payer: BC Managed Care – PPO | Attending: Internal Medicine | Admitting: Internal Medicine

## 2021-09-30 ENCOUNTER — Ambulatory Visit (INDEPENDENT_AMBULATORY_CARE_PROVIDER_SITE_OTHER): Payer: BC Managed Care – PPO

## 2021-09-30 DIAGNOSIS — R0789 Other chest pain: Secondary | ICD-10-CM

## 2021-09-30 DIAGNOSIS — M25512 Pain in left shoulder: Secondary | ICD-10-CM | POA: Diagnosis not present

## 2021-09-30 DIAGNOSIS — M546 Pain in thoracic spine: Secondary | ICD-10-CM

## 2021-09-30 MED ORDER — PREDNISONE 20 MG PO TABS: 40.0000 mg | ORAL_TABLET | Freq: Every day | ORAL | 0 refills | Status: DC

## 2021-09-30 NOTE — ED Triage Notes (Signed)
Pt c/o left chest/left back pain that radiates down left arm and left hand tingles onset 4 days ago.

## 2021-09-30 NOTE — Discharge Instructions (Signed)
Your x-ray was normal.  Your EKG was normal.  You have been prescribed prednisone steroid to decrease inflammation associated with your pain.  Please also alternate ice and heat application to affected area.  Go to the hospital if symptoms do not improve in the next 24 to 48 hours.

## 2021-09-30 NOTE — ED Provider Notes (Signed)
[name]    CSN: [id number] Arrival date & time: 09/30/21  1415      History   Chief Complaint Chief Complaint  Patient presents with   Chest Pain    HPI Jamie Rowland is a 60 y.o. female.   Patient presents with chest pain, left upper back pain, left arm tingling that started approximately 4 days ago.  Patient reports that the symptoms started with left upper back pain that felt like a muscle strain that began to radiate into left arm with associated tingling in the fingers that started approximately 4 days ago.  She reports that the left-sided chest pain started approximately an hour prior to arrival to the urgent care.  Chest pain and back pain is reproducible with movement of the arm.  Denies any apparent injury.  Patient has used ice and heat application as well as Tylenol with minimal improvement in symptoms.  Patient is not able to characterize chest pain.  Denies any history of lung or heart problems.  Denies headache, dizziness, blurred vision, shortness of breath.   Chest Pain  Past Medical History:  Diagnosis Date   Asthma    Bronchitis    Migraine headache    Seasonal allergies     Patient Active Problem List   Diagnosis Date Noted   OA (osteoarthritis) of knee 12/29/2019   GERD (gastroesophageal reflux disease) 07/27/2016   Asthma, chronic 02/17/2014   Seasonal allergies 01/24/2013   URINARY INCONTINENCE, MIXED 04/13/2009   GAD (generalized anxiety disorder) 05/16/2007   Migraine headache 05/10/2007    Past Surgical History:  Procedure Laterality Date   CESAREAN SECTION  6073,7106, 2002   KNEE ARTHROSCOPY Right 2013   LAPAROSCOPIC CHOLECYSTECTOMY  1991   TUBAL LIGATION  2002    OB History   No obstetric history on file.      Home Medications    Prior to Admission medications   Medication Sig Start Date End Date Taking? Authorizing Provider  predniSONE (DELTASONE) 20 MG tablet Take 2 tablets (40 mg total) by mouth daily for 5  days. 09/30/21 10/05/21 Yes Bradon Fester, Rolly Salter E, FNP  albuterol (VENTOLIN HFA) 108 (90 Base) MCG/ACT inhaler INHALE 2 PUFFS INTO THE LUNGS EVERY 6 HOURS AS NEEDED FOR WHEEZING OR SHORTNESS OF BREATH. 06/03/21   Worthy Rancher B, FNP  diclofenac (VOLTAREN) 75 MG EC tablet Take 1 tablet (75 mg total) by mouth 2 (two) times daily. 02/14/21   Lorre Munroe, NP  famotidine (PEPCID) 20 MG tablet Take 1 tablet (20 mg total) by mouth 2 (two) times daily. One at bedtime 06/10/15   Nyoka Cowden, MD  montelukast (SINGULAIR) 10 MG tablet TAKE 1 TABLET BY MOUTH AT BEDTIME. 01/14/21   Lorre Munroe, NP  oxybutynin (DITROPAN XL) 15 MG 24 hr tablet Take 1 tablet (15 mg total) by mouth at bedtime. 02/14/21   Lorre Munroe, NP  sertraline (ZOLOFT) 25 MG tablet TAKE 1 TABLET BY MOUTH DAILY. 01/14/21   Lorre Munroe, NP  simvastatin (ZOCOR) 10 MG tablet Take 1 tablet (10 mg total) by mouth at bedtime. 01/12/21   Lorre Munroe, NP  SUMAtriptan (IMITREX) 25 MG tablet TAKE 1 TABLET BY MOUTH ONCE WHEN NEEDED FOR MIGRAINE. MAY REPEAT IN 2 HOURS IF HEADACHE PERSISTS OR RECURS. 09/21/21   Lorre Munroe, NP    Family History Family History  Problem Relation Age of Onset   Hypertension Mother    Diverticulitis Mother    Fibromyalgia  Mother    Heart failure Mother    Cancer Sister        uterine   Diabetes Maternal Uncle    Heart disease Maternal Uncle    Asthma Maternal Grandmother    Heart disease Maternal Grandmother    Emphysema Maternal Grandmother        smoked   Colon cancer Neg Hx    Esophageal cancer Neg Hx    Rectal cancer Neg Hx    Stomach cancer Neg Hx    Breast cancer Neg Hx     Social History Social History   Tobacco Use   Smoking status: Never   Smokeless tobacco: Never  Substance Use Topics   Alcohol use: No   Drug use: No     Allergies   Codeine sulfate and Latex   Review of Systems Review of Systems Per HPI  Physical Exam Triage Vital Signs ED Triage Vitals [09/30/21 1426]   Enc Vitals Group     BP (!) 164/103     Pulse Rate 91     Resp 18     Temp 98 F (36.7 C)     Temp Source Oral     SpO2 96 %     Weight      Height      Head Circumference      Peak Flow      Pain Score 6     Pain Loc      Pain Edu?      Excl. in GC?    No data found.  Updated Vital Signs BP (!) 164/103 (BP Location: Left Arm)    Pulse 91    Temp 98 F (36.7 C) (Oral)    Resp 18    SpO2 96%   Visual Acuity Right Eye Distance:   Left Eye Distance:   Bilateral Distance:    Right Eye Near:   Left Eye Near:    Bilateral Near:     Physical Exam Constitutional:      General: She is not in acute distress.    Appearance: Normal appearance. She is not toxic-appearing or diaphoretic.  HENT:     Head: Normocephalic and atraumatic.  Eyes:     Extraocular Movements: Extraocular movements intact.     Conjunctiva/sclera: Conjunctivae normal.  Cardiovascular:     Rate and Rhythm: Normal rate and regular rhythm.     Pulses: Normal pulses.     Heart sounds: Normal heart sounds.  Pulmonary:     Effort: Pulmonary effort is normal. No respiratory distress.     Breath sounds: Normal breath sounds.  Chest:     Chest wall: Tenderness present.       Comments: Tenderness to palpation to left upper chest. Musculoskeletal:     Right shoulder: Normal.     Left shoulder: Tenderness and bony tenderness present. No swelling, deformity or crepitus. Decreased range of motion. Normal strength. Normal pulse.     Cervical back: Normal.     Thoracic back: Tenderness present. No swelling, edema or bony tenderness. Normal range of motion.     Lumbar back: Normal.       Back:     Comments: Tenderness to palpation to posterior portion of the left shoulder.  Tenderness to palpation to circled area on diagram to left upper thoracic back.  Chest pain and back pain are elicited with shoulder range of motion.  Patient has full range of motion of shoulder but does have tenderness when shoulder  is at  90 degree angle.  Grip strength 5/5.  Neurovascular intact.  Neurological:     General: No focal deficit present.     Mental Status: She is alert and oriented to person, place, and time. Mental status is at baseline.  Psychiatric:        Mood and Affect: Mood normal.        Behavior: Behavior normal.        Thought Content: Thought content normal.        Judgment: Judgment normal.     UC Treatments / Results  Labs (all labs ordered are listed, but only abnormal results are displayed) Labs Reviewed - No data to display  EKG   Radiology DG Shoulder Left  Result Date: 09/30/2021 CLINICAL DATA:  Left shoulder pain EXAM: LEFT SHOULDER - 2+ VIEW COMPARISON:  None. FINDINGS: There is no evidence of fracture or dislocation. There is no evidence of arthropathy or other focal bone abnormality. Soft tissues are unremarkable. IMPRESSION: Negative. Electronically Signed   By: Allegra Lai M.D.   On: 09/30/2021 15:13    Procedures Procedures (including critical care time)  Medications Ordered in UC Medications - No data to display  Initial Impression / Assessment and Plan / UC Course  I have reviewed the triage vital signs and the nursing notes.  Pertinent labs & imaging results that were available during my care of the patient were reviewed by me and considered in my medical decision making (see chart for details).     EKG completed showing normal sinus rhythm.  Although, do not think that patient's symptoms are cardiac in nature.  Pain is reproducible with palpation so do think that this is a musculoskeletal issue.  Left shoulder x-ray was completed as this seemed to be the origin of pain during physical exam but this was normal and did not show any acute abnormality.  Prednisone x5 days prescribed to decrease inflammation associated with patient's pain.  Unable to prescribe muscle relaxer as this interacts with patient's daily medications.  Patient to alternate ice and heat  application as well.  Discussed very strict precautions for going to the emergency department if pain does not improve in the next 24 to 48 hours or if it worsens.  Patient verbalized understanding and was agreeable with plan. Final Clinical Impressions(s) / UC Diagnoses   Final diagnoses:  Acute left-sided thoracic back pain  Acute pain of left shoulder  Other chest pain     Discharge Instructions      Your x-ray was normal.  Your EKG was normal.  You have been prescribed prednisone steroid to decrease inflammation associated with your pain.  Please also alternate ice and heat application to affected area.  Go to the hospital if symptoms do not improve in the next 24 to 48 hours.    ED Prescriptions     Medication Sig Dispense Auth. Provider   predniSONE (DELTASONE) 20 MG tablet Take 2 tablets (40 mg total) by mouth daily for 5 days. 10 tablet Gustavus Bryant, [place]      PDMP not reviewed this encounter.   Gustavus Bryant, [place] 09/30/21 1540

## 2021-10-05 ENCOUNTER — Ambulatory Visit: Payer: BC Managed Care – PPO | Admitting: Internal Medicine

## 2021-10-05 ENCOUNTER — Other Ambulatory Visit: Payer: Self-pay

## 2021-10-05 ENCOUNTER — Ambulatory Visit
Admission: RE | Admit: 2021-10-05 | Discharge: 2021-10-05 | Disposition: A | Discharge status: Home / Self Care | Payer: BC Managed Care – PPO | Source: Ambulatory Visit | Attending: Internal Medicine | Admitting: Internal Medicine

## 2021-10-05 ENCOUNTER — Encounter: Payer: Self-pay | Admitting: Internal Medicine

## 2021-10-05 VITALS — BP 131/51 | HR 76 | Temp 97.5°F | Resp 18 | Ht 64.0 in | Wt 276.0 lb

## 2021-10-05 DIAGNOSIS — M549 Dorsalgia, unspecified: Secondary | ICD-10-CM

## 2021-10-05 DIAGNOSIS — M542 Cervicalgia: Secondary | ICD-10-CM | POA: Insufficient documentation

## 2021-10-05 DIAGNOSIS — M25512 Pain in left shoulder: Secondary | ICD-10-CM | POA: Diagnosis not present

## 2021-10-05 DIAGNOSIS — R0789 Other chest pain: Secondary | ICD-10-CM | POA: Diagnosis not present

## 2021-10-05 MED ORDER — TRAMADOL HCL 50 MG PO TABS
50.0000 mg | ORAL_TABLET | Freq: Three times a day (TID) | ORAL | 0 refills | Status: AC | PRN
Start: 2021-10-05 — End: 2021-10-10

## 2021-10-05 MED ORDER — CYCLOBENZAPRINE HCL 5 MG PO TABS: 5.0000 mg | ORAL_TABLET | Freq: Three times a day (TID) | ORAL | 0 refills | Status: DC | PRN

## 2021-10-05 NOTE — Progress Notes (Signed)
Subjective:    Patient ID: Jamie Rowland, female    DOB: January 09, 1961, 60 y.o.   MRN: [id number]  HPI  Patient presents the clinic today with complaint of left upper back pain, left shoulder pain and left chest wall pain.  This started 3 weeks ago.  She describes the pain as achy but can be sharp and shooting with certain movements.. She reports associated numbness in her left hand but attributes this to her carpal tunnel which she has had repaired years ago.Marland Kitchen  She was seen at urgent care 2 weeks ago for the same.  X-ray of the left shoulder was negative for acute findings.  ECG was normal.  She was given a prescription for Prednisone and advised to follow-up with her PCP or orthopedics if symptoms not improved.  She reports the prednisone has not provided her any relief.  She has been taking Aleve, dDiclofenac, alternating hot and cold packs with minimal relief of symptoms.  Review of Systems     Past Medical History:  Diagnosis Date   Asthma    Bronchitis    Migraine headache    Seasonal allergies     Current Outpatient Medications  Medication Sig Dispense Refill   albuterol (VENTOLIN HFA) 108 (90 Base) MCG/ACT inhaler INHALE 2 PUFFS INTO THE LUNGS EVERY 6 HOURS AS NEEDED FOR WHEEZING OR SHORTNESS OF BREATH. 8.5 g 2   diclofenac (VOLTAREN) 75 MG EC tablet Take 1 tablet (75 mg total) by mouth 2 (two) times daily. 180 tablet 2   famotidine (PEPCID) 20 MG tablet Take 1 tablet (20 mg total) by mouth 2 (two) times daily. One at bedtime     montelukast (SINGULAIR) 10 MG tablet TAKE 1 TABLET BY MOUTH AT BEDTIME. 90 tablet 3   oxybutynin (DITROPAN XL) 15 MG 24 hr tablet Take 1 tablet (15 mg total) by mouth at bedtime. 90 tablet 2   predniSONE (DELTASONE) 20 MG tablet Take 2 tablets (40 mg total) by mouth daily for 5 days. 10 tablet 0   sertraline (ZOLOFT) 25 MG tablet TAKE 1 TABLET BY MOUTH DAILY. 90 tablet 3   simvastatin (ZOCOR) 10 MG tablet Take 1 tablet (10 mg total) by mouth at bedtime. 30  tablet 2   SUMAtriptan (IMITREX) 25 MG tablet TAKE 1 TABLET BY MOUTH ONCE WHEN NEEDED FOR MIGRAINE. MAY REPEAT IN 2 HOURS IF HEADACHE PERSISTS OR RECURS. 10 tablet 2   No current facility-administered medications for this visit.    Allergies  Allergen Reactions   Codeine Sulfate     REACTION: Hives, can't breathe   Latex Hives    Family History  Problem Relation Age of Onset   Hypertension Mother    Diverticulitis Mother    Fibromyalgia Mother    Heart failure Mother    Cancer Sister        uterine   Diabetes Maternal Uncle    Heart disease Maternal Uncle    Asthma Maternal Grandmother    Heart disease Maternal Grandmother    Emphysema Maternal Grandmother        smoked   Colon cancer Neg Hx    Esophageal cancer Neg Hx    Rectal cancer Neg Hx    Stomach cancer Neg Hx    Breast cancer Neg Hx     Social History   Socioeconomic History   Marital status: Married    Spouse name: Not on file   Number of children: 3   Years of education: Not on  file   Highest education level: Not on file  Occupational History   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  Tobacco Use   Smoking status: Never   Smokeless tobacco: Never  Substance and Sexual Activity   Alcohol use: No   Drug use: No   Sexual activity: Yes  Other Topics Concern   Not on file  Social History Narrative   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  HEENT: Denies eye pain, eye redness, ear pain, ringing in the ears, wax buildup,  runny nose, nasal congestion, bloody nose, or sore throat. Respiratory: Denies difficulty breathing, shortness of breath, cough or sputum production.   Cardiovascular: Denies chest pain, chest tightness, palpitations or swelling in the hands or feet.  Gastrointestinal: Denies abdominal pain, bloating, constipation, diarrhea or blood in the stool.  Musculoskeletal: Pt reports left upper back, left shoulder pain and left chest wall pain. Denies decrease in range of motion, difficulty with gait, muscle pain or joint swelling.  Skin: Denies redness, rashes, lesions or ulcercations.  Neurological: Pt reports left hand numbness. Denies dizziness, difficulty with memory, difficulty with speech or problems with balance and coordination.    No other specific complaints in a complete review of systems (except as listed in HPI above).  Objective:   Physical Exam BP (!) 131/51 (BP Location: Right Arm, Patient Position: Sitting, Cuff Size: Large)    Pulse 76    Temp (!) 97.5 F (36.4 C) (Temporal)    Resp 18    Ht 5\' 4"  (1.626 m)    Wt 276 lb (125.2 kg)    SpO2 99%    BMI 47.38 kg/m   Wt Readings from Last 3 Encounters:  01/06/21 270 lb (122.5 kg)  12/29/19 280 lb (127 kg)  04/10/19 268 lb (121.6 kg)    General: Appears her stated age, obese, in NAD. Skin: Warm, dry and intact.  Cardiovascular: Normal rate and rhythm. S1,S2 noted.  No murmur, rubs or gallops noted.  Pulmonary/Chest: Normal effort and positive vesicular breath sounds. No respiratory distress. No wheezes, rales or ronchi noted.  Musculoskeletal: Pain with extension and rotation to the left of the cervical spine.  Normal flexion, rotation to the right of the cervical spine.  Bony tenderness noted over C7.  Shoulder strength equal.  Normal internal and external rotation of the left shoulder.  No pain with palpation of left shoulder.  Pain with palpation in the left subscapular area and left upper chest wall.  Strength 5/5 BUE.  Handgrips  equal.  No difficulty with gait.  Neurological: Alert and oriented.  Positive Phalen's.  Positive Tinel's.  Coordination normal.    BMET    Component Value Date/Time   NA 141 01/06/2021 1244   K 4.5 01/06/2021 1244   CL 104 01/06/2021 1244   CO2 31 01/06/2021 1244   GLUCOSE 99 01/06/2021 1244   BUN 23 01/06/2021 1244   CREATININE 0.93 01/06/2021 1244   CALCIUM 9.6 01/06/2021 1244   GFRNONAA  81.49 04/08/2010 0831   GFRAA 77 05/16/2007 0947    Lipid Panel     Component Value Date/Time   CHOL 201 (H) 01/06/2021 1244   TRIG 262.0 (H) 01/06/2021 1244   HDL 37.30 (L) 01/06/2021 1244   CHOLHDL 5 01/06/2021 1244   VLDL 52.4 (H) 01/06/2021 1244   LDLCALC 124 (H) 04/13/2020 0837    CBC    Component Value Date/Time   WBC 9.8 01/06/2021 1244   RBC 4.59 01/06/2021 1244   HGB 14.4 01/06/2021 1244   HCT 42.8 01/06/2021 1244   PLT 265.0 01/06/2021 1244   MCV 93.4 01/06/2021 1244   MCHC 33.5 01/06/2021 1244   RDW 13.6 01/06/2021 1244   LYMPHSABS 3.3 07/04/2013 0827   MONOABS 1.0 07/04/2013 0827   EOSABS 0.5 07/04/2013 0827   BASOSABS 0.1 07/04/2013 0827    Hgb A1C Lab Results  Component Value Date   HGBA1C 6.2 01/06/2021            Assessment & Plan:  Urgent care follow-up for Left Upper Back Pain, Left Shoulder Pain, Left Chest Wall Pain, now having Neck Pain:  Urgent care notes and imaging reviewed X-ray cervical spine today Continue Naproxen OTC as needed Rx for Flexeril 5 mg every 8 hours as needed-sedation caution given Rx for Tramadol 50 mg every 8 hours as needed for severe pain Continue to alternate heat and ice.  We will follow-up after imaging with further recommendations and treatment plan  Nicki Reaper, NP This visit occurred during the SARS-CoV-2 public health emergency.  Safety protocols were in place, including screening questions prior to the visit, additional usage of staff PPE, and extensive cleaning of exam room while observing appropriate  contact time as indicated for disinfecting solutions.

## 2021-10-05 NOTE — Patient Instructions (Signed)
Neck Exercises Ask your health care provider which exercises are safe for you. Do exercises exactly as told by your health care provider and adjust them as directed. It is normal to feel mild stretching, pulling, tightness, or discomfort as you do these exercises. Stop right away if you feel sudden pain or your pain gets worse. Do not begin these exercises until told by your health care provider. Neck exercises can be important for many reasons. They can improve strength and maintain flexibility in your neck, which will help your upper back and prevent neck pain. Stretching exercises Rotation neck stretching  Sit in a chair or stand up. Place your feet flat on the floor, shoulder-width apart. Slowly turn your head (rotate) to the right until a slight stretch is felt. Turn it all the way to the right so you can look over your right shoulder. Do not tilt or tip your head. Hold this position for 10-30 seconds. Slowly turn your head (rotate) to the left until a slight stretch is felt. Turn it all the way to the left so you can look over your left shoulder. Do not tilt or tip your head. Hold this position for 10-30 seconds. Repeat __________ times. Complete this exercise __________ times a day. Neck retraction  Sit in a sturdy chair or stand up. Look straight ahead. Do not bend your neck. Use your fingers to push your chin backward (retraction). Do not bend your neck for this movement. Continue to face straight ahead. If you are doing the exercise properly, you will feel a slight sensation in your throat and a stretch at the back of your neck. Hold the stretch for 1-2 seconds. Repeat __________ times. Complete this exercise __________ times a day. Strengthening exercises Neck press  Lie on your back on a firm bed or on the floor with a pillow under your head. Use your neck muscles to push your head down on the pillow and straighten your spine. Hold the position as well as you can. Keep your head  facing up (in a neutral position) and your chin tucked. Slowly count to 5 while holding this position. Repeat __________ times. Complete this exercise __________ times a day. Isometrics These are exercises in which you strengthen the muscles in your neck while keeping your neck still (isometrics). Sit in a supportive chair and place your hand on your forehead. Keep your head and face facing straight ahead. Do not flex or extend your neck while doing isometrics. Push forward with your head and neck while pushing back with your hand. Hold for 10 seconds. Do the sequence again, this time putting your hand against the back of your head. Use your head and neck to push backward against the hand pressure. Finally, do the same exercise on either side of your head, pushing sideways against the pressure of your hand. Repeat __________ times. Complete this exercise __________ times a day. Prone head lifts  Lie face-down (prone position), resting on your elbows so that your chest and upper back are raised. Start with your head facing downward, near your chest. Position your chin either on or near your chest. Slowly lift your head upward. Lift until you are looking straight ahead. Then continue lifting your head as far back as you can comfortably stretch. Hold your head up for 5 seconds. Then slowly lower it to your starting position. Repeat __________ times. Complete this exercise __________ times a day. Supine head lifts  Lie on your back (supine position), bending your knees   to point to the ceiling and keeping your feet flat on the floor. Lift your head slowly off the floor, raising your chin toward your chest. Hold for 5 seconds. Repeat __________ times. Complete this exercise __________ times a day. Scapular retraction  Stand with your arms at your sides. Look straight ahead. Slowly pull both shoulders (scapulae) backward and downward (retraction) until you feel a stretch between your shoulder  blades in your upper back. Hold for 10-30 seconds. Relax and repeat. Repeat __________ times. Complete this exercise __________ times a day. Contact a health care provider if: Your neck pain or discomfort gets worse when you do an exercise. Your neck pain or discomfort does not improve within 2 hours after you exercise. If you have any of these problems, stop exercising right away. Do not do the exercises again unless your health care provider says that you can. Get help right away if: You develop sudden, severe neck pain. If this happens, stop exercising right away. Do not do the exercises again unless your health care provider says that you can. This information is not intended to replace advice given to you by your health care provider. Make sure you discuss any questions you have with your health care provider. Document Revised: 03/29/2021 Document Reviewed: 03/29/2021 Elsevier Patient Education  2022 Elsevier Inc.  

## 2021-10-11 ENCOUNTER — Telehealth: Payer: Self-pay

## 2021-10-11 DIAGNOSIS — M5412 Radiculopathy, cervical region: Secondary | ICD-10-CM

## 2021-10-11 DIAGNOSIS — M542 Cervicalgia: Secondary | ICD-10-CM

## 2021-10-11 NOTE — Telephone Encounter (Signed)
Copied from CRM [id number]. Topic: Referral - Request for Referral >> Oct 11, 2021 11:06 AM McGill, Darlina Rumpf wrote: Pt requesting referral to neurosurgery per notes Rene Kocher, left under her x-ray results from 10/05/2021. Pt requesting appointment for after 10/21/2021.

## 2021-10-11 NOTE — Telephone Encounter (Signed)
Referral placed.

## 2021-10-13 ENCOUNTER — Other Ambulatory Visit: Payer: Self-pay | Admitting: Internal Medicine

## 2021-10-13 NOTE — Telephone Encounter (Signed)
Requested medication (s) are due for refill today: yes  Requested medication (s) are on the active medication list: no  Last refill:  NA  Future visit scheduled: No  Notes to clinic:  Unable to refill per protocol, cannot delegate.      Requested Prescriptions  Pending Prescriptions Disp Refills   traMADol (ULTRAM) 50 MG tablet [Pharmacy Med Name: TRAMADOL HCL 50 MG TABLET 50 Tablet] 15 tablet 0    Sig: TAKE 1 TABLET BY MOUTH EVERY 8 HOURS AS NEEDED FOR UP TO 5 DAYS.     Not Delegated - Analgesics:  Opioid Agonists Failed - 10/13/2021 11:09 AM      Failed - This refill cannot be delegated      Failed - Urine Drug Screen completed in last 360 days      Passed - Valid encounter within last 6 months    Recent Outpatient Visits           1 week ago Neck pain   [facility], [place], [place]

## 2021-11-18 ENCOUNTER — Other Ambulatory Visit: Payer: Self-pay | Admitting: Family

## 2021-11-22 ENCOUNTER — Other Ambulatory Visit: Payer: Self-pay | Admitting: Internal Medicine

## 2021-11-22 NOTE — Telephone Encounter (Signed)
Copied from CRM [id number]. Topic: Quick Communication - Rx Refill/Question >> Nov 22, 2021  3:50 PM Izora Ribas, Everette A wrote: Medication: albuterol (VENTOLIN HFA) 108 (90 Base) MCG/ACT inhaler [[id number]]   Has the patient contacted their pharmacy? Yes.  The patient has been directed to contact their PCP (Agent: If no, request that the patient contact the pharmacy for the refill. If patient does not wish to contact the pharmacy document the reason why and proceed with request.) (Agent: If yes, when and what did the pharmacy advise?)  Preferred Pharmacy (with phone number or street name): [facility] - [place], [place] - [address] [place]e [place] [address] Phone: [phone] Fax: [phone]  Has the patient been seen for an appointment in the last year OR does the patient have an upcoming appointment? Yes.    Agent: Please be advised that RX refills may take up to 3 business days. We ask that you follow-up with your pharmacy.

## 2021-11-23 MED ORDER — ALBUTEROL SULFATE HFA 108 (90 BASE) MCG/ACT IN AERS: 2.0000 | INHALATION_SPRAY | Freq: Four times a day (QID) | RESPIRATORY_TRACT | 2 refills | Status: DC | PRN

## 2021-11-23 NOTE — Telephone Encounter (Signed)
Requested medication (s) are due for refill today:   Not sure  Requested medication (s) are on the active medication list:   Yes  Future visit scheduled:   No   Last ordered: 06/03/2021 8.5 g, 2 refills by Bebe Shaggy, FNP.  There is an order pending for this medication for 11/18/2021 for 8.5 g, with 2 refills by Dutch Quint, FNP.    It looks like pt is staying with [facility].   Provider to review for refill since prescribed by another provider.   Requested Prescriptions  Pending Prescriptions Disp Refills   albuterol (VENTOLIN HFA) 108 (90 Base) MCG/ACT inhaler 8.5 g 2    Sig: Inhale 2 puffs into the lungs every 6 (six) hours as needed for wheezing or shortness of breath.     Pulmonology:  Beta Agonists 2 Passed - 11/22/2021  4:16 PM      Passed - Last BP in normal range    BP Readings from Last 1 Encounters:  10/05/21 (!) 131/51          Passed - Last Heart Rate in normal range    Pulse Readings from Last 1 Encounters:  10/05/21 76          Passed - Valid encounter within last 12 months    Recent Outpatient Visits           1 month ago Neck pain   [facility] [place], Coralie Keens, [place]

## 2021-12-26 ENCOUNTER — Other Ambulatory Visit: Payer: Self-pay | Admitting: Internal Medicine

## 2021-12-26 NOTE — Telephone Encounter (Signed)
Requested Prescriptions  ?Pending Prescriptions Disp Refills  ?? diclofenac (VOLTAREN) 75 MG EC tablet [Pharmacy Med Name: DICLOFENAC SOD DR 75 MG TAB 75 Tablet] 60 tablet 2  ?  Sig: TAKE 1 TABLET BY MOUTH 2 TIMES DAILY.  ?  ? Analgesics:  NSAIDS Failed - 12/26/2021  5:04 PM  ?  ?  Failed - Manual Review: Labs are only required if the patient has taken medication for more than 8 weeks.  ?  ?  Passed - Cr in normal range and within 360 days  ?  Creatinine, Ser  ?Date Value Ref Range Status  ?01/06/2021 0.93 0.40 - 1.20 mg/dL Final  ?   ?  ?  Passed - HGB in normal range and within 360 days  ?  Hemoglobin  ?Date Value Ref Range Status  ?01/06/2021 14.4 12.0 - 15.0 g/dL Final  ?   ?  ?  Passed - PLT in normal range and within 360 days  ?  Platelets  ?Date Value Ref Range Status  ?01/06/2021 265.0 150.0 - 400.0 K/uL Final  ?   ?  ?  Passed - HCT in normal range and within 360 days  ?  HCT  ?Date Value Ref Range Status  ?01/06/2021 42.8 36.0 - 46.0 % Final  ?   ?  ?  Passed - eGFR is 30 or above and within 360 days  ?  GFR calc Af Amer  ?Date Value Ref Range Status  ?05/16/2007 77 mL/min Final  ? ?GFR calc non Af Amer  ?Date Value Ref Range Status  ?04/08/2010 81.49 >60 mL/min Final  ? ?GFR  ?Date Value Ref Range Status  ?01/06/2021 67.81 >60.00 mL/min Final  ?  Comment:  ?  Calculated using the CKD-EPI Creatinine Equation (2021)  ?   ?  ?  Passed - Patient is not pregnant  ?  ?  Passed - Valid encounter within last 12 months  ?  Recent Outpatient Visits   ?      ? 2 months ago Neck pain  ? [facility] [place], Coralie Keens, NP  ?  ?  ? ?  ?  ?  ? ?

## 2022-02-08 ENCOUNTER — Other Ambulatory Visit: Payer: Self-pay | Admitting: Internal Medicine

## 2022-02-08 DIAGNOSIS — N3946 Mixed incontinence: Secondary | ICD-10-CM

## 2022-02-08 DIAGNOSIS — Z0000 Encounter for general adult medical examination without abnormal findings: Secondary | ICD-10-CM

## 2022-02-08 DIAGNOSIS — G43C1 Periodic headache syndromes in child or adult, intractable: Secondary | ICD-10-CM

## 2022-02-08 DIAGNOSIS — K219 Gastro-esophageal reflux disease without esophagitis: Secondary | ICD-10-CM

## 2022-02-09 NOTE — Telephone Encounter (Signed)
Requested medication (s) are due for refill today: Yes ? ?Requested medication (s) are on the active medication list: Yes ? ?Last refill:  01/14/21 ? ?Future visit scheduled: No ? ?Notes to clinic:  Prescription expired. ? ? ? ?Requested Prescriptions  ?Pending Prescriptions Disp Refills  ? sertraline (ZOLOFT) 25 MG tablet [Pharmacy Med Name: SERTRALINE HCL 25 MG TABS 25 Tablet] 90 tablet 3  ?  Sig: TAKE 1 TABLET BY MOUTH DAILY.  ?  ? Not Delegated - Psychiatry:  Antidepressants - SSRI - sertraline Failed - 02/09/2022 11:22 AM  ?  ?  Failed - This refill cannot be delegated  ?  ?  Failed - AST in normal range and within 360 days  ?  AST  ?Date Value Ref Range Status  ?01/06/2021 12 0 - 37 U/L Final  ?  ?  ?  ?  Failed - ALT in normal range and within 360 days  ?  ALT  ?Date Value Ref Range Status  ?01/06/2021 20 0 - 35 U/L Final  ?  ?  ?  ?  Failed - Completed PHQ-2 or PHQ-9 in the last 360 days  ?  ?  Passed - Valid encounter within last 6 months  ?  Recent Outpatient Visits   ? ?      ? 4 months ago Neck pain  ? [facility] [place], Salvadore Oxford, NP  ? ?  ?  ? ? ?  ?  ?  ? ?

## 2022-02-20 ENCOUNTER — Other Ambulatory Visit: Payer: Self-pay | Admitting: Internal Medicine

## 2022-02-20 DIAGNOSIS — Z0000 Encounter for general adult medical examination without abnormal findings: Secondary | ICD-10-CM

## 2022-02-20 DIAGNOSIS — G43C1 Periodic headache syndromes in child or adult, intractable: Secondary | ICD-10-CM

## 2022-02-20 DIAGNOSIS — N3946 Mixed incontinence: Secondary | ICD-10-CM

## 2022-02-20 DIAGNOSIS — K219 Gastro-esophageal reflux disease without esophagitis: Secondary | ICD-10-CM

## 2022-02-21 NOTE — Telephone Encounter (Signed)
Requested medication (s) are due for refill today - expired Rx ? ?Requested medication (s) are on the active medication list -yes ? ?Future visit scheduled -no ? ?Last refill: sertraline- 01/14/21 #90 3RF- refused last request ?                Oxybutynin-02/14/21 #90 2RF- expired Rx ? ?Notes to clinic: Attempted to call patient to schedule appointment- left message to call office, expired Rx ? ?Requested Prescriptions  ?Pending Prescriptions Disp Refills  ? sertraline (ZOLOFT) 25 MG tablet [Pharmacy Med Name: SERTRALINE HCL 25 MG TABS 25 Tablet] 90 tablet 3  ?  Sig: TAKE 1 TABLET BY MOUTH DAILY.  ?  ? Psychiatry:  Antidepressants - SSRI - sertraline Failed - 02/21/2022  2:53 PM  ?  ?  Failed - AST in normal range and within 360 days  ?  AST  ?Date Value Ref Range Status  ?01/06/2021 12 0 - 37 U/L Final  ?  ?  ?  ?  Failed - ALT in normal range and within 360 days  ?  ALT  ?Date Value Ref Range Status  ?01/06/2021 20 0 - 35 U/L Final  ?  ?  ?  ?  Failed - Completed PHQ-2 or PHQ-9 in the last 360 days  ?  ?  Passed - Valid encounter within last 6 months  ?  Recent Outpatient Visits   ? ?      ? 4 months ago Neck pain  ? [facility] [place], [place] W, NP  ? ?  ?  ? ? ?  ?  ?  ? oxybutynin (DITROPAN XL) 15 MG 24 hr tablet [Pharmacy Med Name: OXYBUTYNIN CL ER 15 MG TAB 15 Tablet] 30 tablet 2  ?  Sig: TAKE 1 TABLET BY MOUTH AT BEDTIME.  ?  ? Urology:  Bladder Agents Passed - 02/21/2022  2:53 PM  ?  ?  Passed - Valid encounter within last 12 months  ?  Recent Outpatient Visits   ? ?      ? 4 months ago Neck pain  ? [facility] [place], Salvadore Oxford, NP  ? ?  ?  ? ? ?  ?  ?  ? ? ? ?Requested Prescriptions  ?Pending Prescriptions Disp Refills  ? sertraline (ZOLOFT) 25 MG tablet [Pharmacy Med Name: SERTRALINE HCL 25 MG TABS 25 Tablet] 90 tablet 3  ?  Sig: TAKE 1 TABLET BY MOUTH DAILY.  ?  ? Psychiatry:  Antidepressants - SSRI - sertraline Failed - 02/21/2022  2:53 PM  ?  ?  Failed - AST in normal range and  within 360 days  ?  AST  ?Date Value Ref Range Status  ?01/06/2021 12 0 - 37 U/L Final  ?  ?  ?  ?  Failed - ALT in normal range and within 360 days  ?  ALT  ?Date Value Ref Range Status  ?01/06/2021 20 0 - 35 U/L Final  ?  ?  ?  ?  Failed - Completed PHQ-2 or PHQ-9 in the last 360 days  ?  ?  Passed - Valid encounter within last 6 months  ?  Recent Outpatient Visits   ? ?      ? 4 months ago Neck pain  ? [facility] [place], [place] W, NP  ? ?  ?  ? ? ?  ?  ?  ? oxybutynin (DITROPAN XL) 15 MG 24 hr tablet [  Pharmacy Med Name: OXYBUTYNIN CL ER 15 MG TAB 15 Tablet] 30 tablet 2  ?  Sig: TAKE 1 TABLET BY MOUTH AT BEDTIME.  ?  ? Urology:  Bladder Agents Passed - 02/21/2022  2:53 PM  ?  ?  Passed - Valid encounter within last 12 months  ?  Recent Outpatient Visits   ? ?      ? 4 months ago Neck pain  ? [facility] [place], Salvadore Oxford, NP  ? ?  ?  ? ? ?  ?  ?  ? ? ? ?

## 2022-02-23 ENCOUNTER — Ambulatory Visit: Payer: Self-pay | Admitting: Internal Medicine

## 2022-02-23 ENCOUNTER — Other Ambulatory Visit (HOSPITAL_COMMUNITY)
Admission: RE | Admit: 2022-02-23 | Discharge: 2022-02-23 | Disposition: A | Discharge status: Home / Self Care | Payer: BC Managed Care – PPO | Source: Ambulatory Visit | Attending: Internal Medicine | Admitting: Internal Medicine

## 2022-02-23 ENCOUNTER — Encounter: Payer: Self-pay | Admitting: Internal Medicine

## 2022-02-23 VITALS — BP 134/82 | HR 86 | Temp 97.3°F | Ht 65.0 in | Wt 270.0 lb

## 2022-02-23 DIAGNOSIS — E1169 Type 2 diabetes mellitus with other specified complication: Secondary | ICD-10-CM | POA: Insufficient documentation

## 2022-02-23 DIAGNOSIS — Z6841 Body Mass Index (BMI) 40.0 and over, adult: Secondary | ICD-10-CM | POA: Insufficient documentation

## 2022-02-23 DIAGNOSIS — E785 Hyperlipidemia, unspecified: Secondary | ICD-10-CM | POA: Insufficient documentation

## 2022-02-23 DIAGNOSIS — Z0001 Encounter for general adult medical examination with abnormal findings: Secondary | ICD-10-CM | POA: Diagnosis not present

## 2022-02-23 DIAGNOSIS — R7303 Prediabetes: Secondary | ICD-10-CM | POA: Insufficient documentation

## 2022-02-23 DIAGNOSIS — Z124 Encounter for screening for malignant neoplasm of cervix: Secondary | ICD-10-CM | POA: Diagnosis not present

## 2022-02-23 DIAGNOSIS — Z1231 Encounter for screening mammogram for malignant neoplasm of breast: Secondary | ICD-10-CM

## 2022-02-23 DIAGNOSIS — Z780 Asymptomatic menopausal state: Secondary | ICD-10-CM

## 2022-02-23 DIAGNOSIS — E782 Mixed hyperlipidemia: Secondary | ICD-10-CM

## 2022-02-23 MED ORDER — DICLOFENAC SODIUM 75 MG PO TBEC: 75.0000 mg | DELAYED_RELEASE_TABLET | Freq: Two times a day (BID) | ORAL | 1 refills | Status: DC

## 2022-02-23 MED ORDER — SUMATRIPTAN SUCCINATE 25 MG PO TABS: ORAL_TABLET | ORAL | 5 refills | Status: DC

## 2022-02-23 MED ORDER — MONTELUKAST SODIUM 10 MG PO TABS: 10.0000 mg | ORAL_TABLET | Freq: Every day | ORAL | 1 refills | Status: DC

## 2022-02-23 MED ORDER — MIRABEGRON ER 25 MG PO TB24: 25.0000 mg | ORAL_TABLET | Freq: Every day | ORAL | 1 refills | Status: DC

## 2022-02-23 MED ORDER — SERTRALINE HCL 25 MG PO TABS: 25.0000 mg | ORAL_TABLET | Freq: Every day | ORAL | 1 refills | Status: DC

## 2022-02-23 NOTE — Assessment & Plan Note (Signed)
Encourage diet and exercise for weight loss 

## 2022-02-23 NOTE — Progress Notes (Signed)
? ?Subjective:  ? ? Patient ID: Jamie Rowland, female    DOB: 1961/02/18, 61 y.o.   MRN: [id number] ? ?HPI ? ?Patient presents to clinic today for her annual exam. ? ?Flu: 12/2020 ?Tetanus: 07/2016 ?COVID: Pfizer x2 ?Pneumovax: 07/2016 ?Shingrix: Never ?Pap smear: 02/2015 ?Mammogram: 05/2020 ?Bone density: Never ?Colon screening: 11/2013 ?Vision screening: annually ?Dentist: biannually ? ?Diet: She does eat meat. She consumes fruits and veggies. She tries to avoid fried foods. She drinks mostly water, soda. ?Exercise: None ? ?Review of Systems ? ?   ?Past Medical History:  ?Diagnosis Date  ? Asthma   ? Bronchitis   ? Migraine headache   ? Seasonal allergies   ? ? ?Current Outpatient Medications  ?Medication Sig Dispense Refill  ? albuterol (VENTOLIN HFA) 108 (90 Base) MCG/ACT inhaler Inhale 2 puffs into the lungs every 6 (six) hours as needed for wheezing or shortness of breath. 8.5 g 2  ? cyclobenzaprine (FLEXERIL) 5 MG tablet Take 1 tablet (5 mg total) by mouth 3 (three) times daily as needed for muscle spasms. 30 tablet 0  ? diclofenac (VOLTAREN) 75 MG EC tablet TAKE 1 TABLET BY MOUTH 2 TIMES DAILY. 60 tablet 2  ? montelukast (SINGULAIR) 10 MG tablet TAKE 1 TABLET BY MOUTH AT BEDTIME. 90 tablet 3  ? naproxen sodium (ALEVE) 220 MG tablet Take 220 mg by mouth every 6 (six) hours as needed.    ? oxybutynin (DITROPAN XL) 15 MG 24 hr tablet Take 1 tablet (15 mg total) by mouth at bedtime. 90 tablet 2  ? sertraline (ZOLOFT) 25 MG tablet TAKE 1 TABLET BY MOUTH DAILY. 90 tablet 3  ? SUMAtriptan (IMITREX) 25 MG tablet TAKE 1 TABLET BY MOUTH ONCE WHEN NEEDED FOR MIGRAINE. MAY REPEAT IN 2 HOURS IF HEADACHE PERSISTS OR RECURS. 10 tablet 2  ? ?No current facility-administered medications for this visit.  ? ? ?Allergies  ?Allergen Reactions  ? Codeine Sulfate   ?  REACTION: Hives, can't breathe  ? Latex Hives  ? ? ?Family History  ?Problem Relation Age of Onset  ? Hypertension Mother   ? Diverticulitis Mother   ? Fibromyalgia  Mother   ? Heart failure Mother   ? Cancer Sister   ?     uterine  ? Diabetes Maternal Uncle   ? Heart disease Maternal Uncle   ? Asthma Maternal Grandmother   ? Heart disease Maternal Grandmother   ? Emphysema Maternal Grandmother   ?     smoked  ? Colon cancer Neg Hx   ? Esophageal cancer Neg Hx   ? Rectal cancer Neg Hx   ? Stomach cancer Neg Hx   ? Breast cancer Neg Hx   ? ? ?Social History  ? ?Socioeconomic History  ? Marital status: Married  ?  Spouse name: Not on file  ? Number of children: 3  ? Years of education: Not on file  ? Highest education level: Not on file  ?Occupational History  ? Occupation: Ship broker   ?  Employer: Not Employed  ?Tobacco Use  ? Smoking status: Never  ? Smokeless tobacco: Never  ?Substance and Sexual Activity  ? Alcohol use: No  ? Drug use: No  ? Sexual activity: Yes  ?Other Topics Concern  ? Not on file  ?Social History Narrative  ? 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 Attending [employer] n[employer] program--to start in Sept  ? 06/2009--nsg program  ? ?Social  Determinants of Health  ? ?Financial Resource Strain: Not on file  ?Food Insecurity: Not on file  ?Transportation Needs: Not on file  ?Physical Activity: Not on file  ?Stress: Not on file  ?Social Connections: Not on file  ?Intimate Partner Violence: Not on file  ? ? ? ?Constitutional: Patient reports intermittent headaches.  Denies fever, malaise, fatigue, or abrupt weight changes.  ?HEENT: Denies eye pain, eye redness, ear pain, ringing in the ears, wax buildup, runny nose, nasal congestion, bloody nose, or sore throat. ?Respiratory: Denies difficulty breathing, shortness of breath, cough or sputum production.   ?Cardiovascular: Denies chest pain, chest tightness, palpitations or swelling in the hands or feet.  ?Gastrointestinal: Denies abdominal pain, bloating, constipation, diarrhea or blood in the stool.  ?GU: Pt reports mixed incontinence. Denies pain with urination, burning  sensation, blood in urine, odor or discharge. ?Musculoskeletal: Patient reports intermittent joint pain, left knee pain.  Denies decrease in range of motion, difficulty with gait, muscle pain or joint swelling.  ?Skin: Denies redness, rashes, lesions or ulcercations.  ?Neurological: Denies dizziness, difficulty with memory, difficulty with speech or problems with balance and coordination.  ?Psych: Patient has a history of anxiety.  Denies depression, SI/HI. ? ?No other specific complaints in a complete review of systems (except as listed in HPI above). ? ?Objective:  ? Physical Exam ?BP 134/82 (BP Location: Left Arm, Patient Position: Sitting, Cuff Size: Large)   Pulse 86   Temp (!) 97.3 ?F (36.3 ?C) (Temporal)   Ht '5\' 5"'  (1.651 m)   Wt 270 lb (122.5 kg)   SpO2 99%   BMI 44.93 kg/m?  ? ?Wt Readings from Last 3 Encounters:  ?10/05/21 276 lb (125.2 kg)  ?01/06/21 270 lb (122.5 kg)  ?12/29/19 280 lb (127 kg)  ? ? ?General: Appears her stated age, obese, in NAD. ?Skin: Warm, dry and intact.  ?HEENT: Head: normal shape and size; Eyes: sclera white, no icterus, conjunctiva pink, PERRLA and EOMs intact;  ?Neck:  Neck supple, trachea midline. No masses, lumps or thyromegaly present.  ?Cardiovascular: Normal rate and rhythm. S1,S2 noted.  No murmur, rubs or gallops noted. No JVD or BLE edema. No carotid bruits noted. ?Pulmonary/Chest: Normal effort and positive vesicular breath sounds. No respiratory distress. No wheezes, rales or ronchi noted.  ?Abdomen: Soft and nontender. Normal bowel sounds.  ?Pelvic: Normal female anatomy.  Cervix without mass or lesion.  No CMT.  Adnexa nonpalpable. ?Musculoskeletal: Strength 5/5 BUE/BLE.  No difficulty with gait.  ?Neurological: Alert and oriented. Cranial nerves II-XII grossly intact. Coordination normal.  ?Psychiatric: Mood and affect normal. Behavior is normal. Judgment and thought content normal.  ? ? ? ?BMET ?   ?Component Value Date/Time  ? NA 141 01/06/2021 1244  ? K 4.5  01/06/2021 1244  ? CL 104 01/06/2021 1244  ? CO2 31 01/06/2021 1244  ? GLUCOSE 99 01/06/2021 1244  ? BUN 23 01/06/2021 1244  ? CREATININE 0.93 01/06/2021 1244  ? CALCIUM 9.6 01/06/2021 1244  ? GFRNONAA 81.49 04/08/2010 0831  ? GFRAA 77 05/16/2007 0947  ? ? ?Lipid Panel  ?   ?Component Value Date/Time  ? CHOL 201 (H) 01/06/2021 1244  ? TRIG 262.0 (H) 01/06/2021 1244  ? HDL 37.30 (L) 01/06/2021 1244  ? CHOLHDL 5 01/06/2021 1244  ? VLDL 52.4 (H) 01/06/2021 1244  ? Lamar 124 (H) 04/13/2020 1610  ? ? ?CBC ?   ?Component Value Date/Time  ? WBC 9.8 01/06/2021 1244  ? RBC 4.59 01/06/2021 1244  ?  HGB 14.4 01/06/2021 1244  ? HCT 42.8 01/06/2021 1244  ? PLT 265.0 01/06/2021 1244  ? MCV 93.4 01/06/2021 1244  ? MCHC 33.5 01/06/2021 1244  ? RDW 13.6 01/06/2021 1244  ? LYMPHSABS 3.3 07/04/2013 0827  ? MONOABS 1.0 07/04/2013 0827  ? EOSABS 0.5 07/04/2013 0827  ? BASOSABS 0.1 07/04/2013 0827  ? ? ?Hgb A1C ?Lab Results  ?Component Value Date  ? HGBA1C 6.2 01/06/2021  ? ? ? ? ? ? ?   ?Assessment & Plan:  ? ?Preventative Health Maintenance: ? ?Encouraged her to get a flu shot in the fall ?Tetanus UTD ?Encouraged her to get her COVID booster ?Will not need another pneumonia vaccine until she is 29 ?Discussed Shingrix vaccine, she will check coverage with her insurance company and schedule a nurse visit if she would like to get this done ?Pap smear today, she declines STD screening ?Mammogram and bone density ordered-she will call to schedule ?Colon screening UTD ?Encouraged her to consume a balanced diet and exercise regimen ?Advised her to see an eye doctor and dentist annually ?We will check CBC, c-Met, lipid, A1c today ? ?RTC in 6 months, follow-up chronic conditions ?[facility], NP ? ?

## 2022-02-23 NOTE — Patient Instructions (Signed)
Health Maintenance for Postmenopausal Women Menopause is a normal process in which your ability to get pregnant comes to an end. This process happens slowly over many months or years, usually between the ages of 48 and 55. Menopause is complete when you have missed your menstrual period for 12 months. It is important to talk with your health care provider about some of the most common conditions that affect women after menopause (postmenopausal women). These include heart disease, cancer, and bone loss (osteoporosis). Adopting a healthy lifestyle and getting preventive care can help to promote your health and wellness. The actions you take can also lower your chances of developing some of these common conditions. What are the signs and symptoms of menopause? During menopause, you may have the following symptoms: Hot flashes. These can be moderate or severe. Night sweats. Decrease in sex drive. Mood swings. Headaches. Tiredness (fatigue). Irritability. Memory problems. Problems falling asleep or staying asleep. Talk with your health care provider about treatment options for your symptoms. Do I need hormone replacement therapy? Hormone replacement therapy is effective in treating symptoms that are caused by menopause, such as hot flashes and night sweats. Hormone replacement carries certain risks, especially as you become older. If you are thinking about using estrogen or estrogen with progestin, discuss the benefits and risks with your health care provider. How can I reduce my risk for heart disease and stroke? The risk of heart disease, heart attack, and stroke increases as you age. One of the causes may be a change in the body's hormones during menopause. This can affect how your body uses dietary fats, triglycerides, and cholesterol. Heart attack and stroke are medical emergencies. There are many things that you can do to help prevent heart disease and stroke. Watch your blood pressure High  blood pressure causes heart disease and increases the risk of stroke. This is more likely to develop in people who have high blood pressure readings or are overweight. Have your blood pressure checked: Every 3-5 years if you are 18-39 years of age. Every year if you are 40 years old or older. Eat a healthy diet  Eat a diet that includes plenty of vegetables, fruits, low-fat dairy products, and lean protein. Do not eat a lot of foods that are high in solid fats, added sugars, or sodium. Get regular exercise Get regular exercise. This is one of the most important things you can do for your health. Most adults should: Try to exercise for at least 150 minutes each week. The exercise should increase your heart rate and make you sweat (moderate-intensity exercise). Try to do strengthening exercises at least twice each week. Do these in addition to the moderate-intensity exercise. Spend less time sitting. Even light physical activity can be beneficial. Other tips Work with your health care provider to achieve or maintain a healthy weight. Do not use any products that contain nicotine or tobacco. These products include cigarettes, chewing tobacco, and vaping devices, such as e-cigarettes. If you need help quitting, ask your health care provider. Know your numbers. Ask your health care provider to check your cholesterol and your blood sugar (glucose). Continue to have your blood tested as directed by your health care provider. Do I need screening for cancer? Depending on your health history and family history, you may need to have cancer screenings at different stages of your life. This may include screening for: Breast cancer. Cervical cancer. Lung cancer. Colorectal cancer. What is my risk for osteoporosis? After menopause, you may be   at increased risk for osteoporosis. Osteoporosis is a condition in which bone destruction happens more quickly than new bone creation. To help prevent osteoporosis or  the bone fractures that can happen because of osteoporosis, you may take the following actions: If you are 19-50 years old, get at least 1,000 mg of calcium and at least 600 international units (IU) of vitamin D per day. If you are older than age 50 but younger than age 70, get at least 1,200 mg of calcium and at least 600 international units (IU) of vitamin D per day. If you are older than age 70, get at least 1,200 mg of calcium and at least 800 international units (IU) of vitamin D per day. Smoking and drinking excessive alcohol increase the risk of osteoporosis. Eat foods that are rich in calcium and vitamin D, and do weight-bearing exercises several times each week as directed by your health care provider. How does menopause affect my mental health? Depression may occur at any age, but it is more common as you become older. Common symptoms of depression include: Feeling depressed. Changes in sleep patterns. Changes in appetite or eating patterns. Feeling an overall lack of motivation or enjoyment of activities that you previously enjoyed. Frequent crying spells. Talk with your health care provider if you think that you are experiencing any of these symptoms. General instructions See your health care provider for regular wellness exams and vaccines. This may include: Scheduling regular health, dental, and eye exams. Getting and maintaining your vaccines. These include: Influenza vaccine. Get this vaccine each year before the flu season begins. Pneumonia vaccine. Shingles vaccine. Tetanus, diphtheria, and pertussis (Tdap) booster vaccine. Your health care provider may also recommend other immunizations. Tell your health care provider if you have ever been abused or do not feel safe at home. Summary Menopause is a normal process in which your ability to get pregnant comes to an end. This condition causes hot flashes, night sweats, decreased interest in sex, mood swings, headaches, or lack  of sleep. Treatment for this condition may include hormone replacement therapy. Take actions to keep yourself healthy, including exercising regularly, eating a healthy diet, watching your weight, and checking your blood pressure and blood sugar levels. Get screened for cancer and depression. Make sure that you are up to date with all your vaccines. This information is not intended to replace advice given to you by your health care provider. Make sure you discuss any questions you have with your health care provider. Document Revised: 02/21/2021 Document Reviewed: 02/21/2021 Elsevier Patient Education  2023 Elsevier Inc.  

## 2022-02-24 LAB — LIPID PANEL
Cholesterol: 179 mg/dL (ref <200)
HDL: 40 mg/dL — ABNORMAL LOW (ref >=50)
LDL Cholesterol (Calc): 108 mg/dL (calc) — ABNORMAL HIGH
Non-HDL Cholesterol (Calc): 139 mg/dL (calc) — ABNORMAL HIGH (ref <130)
Total CHOL/HDL Ratio: 4.5 (calc) (ref <5.0)
Triglycerides: 184 mg/dL — ABNORMAL HIGH (ref <150)

## 2022-02-24 LAB — CBC
HCT: 45 % (ref 35.0–45.0)
Hemoglobin: 14.5 g/dL (ref 11.7–15.5)
MCH: 31 pg (ref 27.0–33.0)
MCHC: 32.2 g/dL (ref 32.0–36.0)
MCV: 96.2 fL (ref 80.0–100.0)
MPV: 11.7 fL (ref 7.5–12.5)
Platelets: 289 10*3/uL (ref 140–400)
RBC: 4.68 10*6/uL (ref 3.80–5.10)
RDW: 12.7 % (ref 11.0–15.0)
WBC: 9.3 10*3/uL (ref 3.8–10.8)

## 2022-02-24 LAB — HEMOGLOBIN A1C
Hgb A1c MFr Bld: 5.8 % of total Hgb — ABNORMAL HIGH (ref <5.7)
Mean Plasma Glucose: 120 mg/dL
eAG (mmol/L): 6.6 mmol/L

## 2022-02-24 LAB — COMPLETE METABOLIC PANEL WITH GFR
AG Ratio: 1.4 (calc) (ref 1.0–2.5)
ALT: 16 U/L (ref 6–29)
AST: 12 U/L (ref 10–35)
Albumin: 4 g/dL (ref 3.6–5.1)
Alkaline phosphatase (APISO): 69 U/L (ref 37–153)
BUN: 20 mg/dL (ref 7–25)
CO2: 27 mmol/L (ref 20–32)
Calcium: 9.3 mg/dL (ref 8.6–10.4)
Chloride: 105 mmol/L (ref 98–110)
Creat: 0.76 mg/dL (ref 0.50–1.03)
Globulin: 2.9 g/dL (calc) (ref 1.9–3.7)
Glucose, Bld: 158 mg/dL — ABNORMAL HIGH (ref 65–139)
Potassium: 4.3 mmol/L (ref 3.5–5.3)
Sodium: 141 mmol/L (ref 135–146)
Total Bilirubin: 0.4 mg/dL (ref 0.2–1.2)
Total Protein: 6.9 g/dL (ref 6.1–8.1)
eGFR: 90 mL/min/{1.73_m2} (ref >=60)

## 2022-02-28 MED ORDER — ATORVASTATIN CALCIUM 10 MG PO TABS: 10.0000 mg | ORAL_TABLET | ORAL | 0 refills | Status: DC

## 2022-02-28 NOTE — Addendum Note (Signed)
Addended by: Lorre Munroe on: 02/28/2022 09:19 AM ? ? Modules accepted: Orders ? ?

## 2022-03-01 LAB — CYTOLOGY - PAP
Adequacy: ABSENT
Diagnosis: NEGATIVE

## 2022-03-22 DIAGNOSIS — Z124 Encounter for screening for malignant neoplasm of cervix: Secondary | ICD-10-CM | POA: Diagnosis not present

## 2022-03-22 DIAGNOSIS — Z0001 Encounter for general adult medical examination with abnormal findings: Secondary | ICD-10-CM | POA: Diagnosis not present

## 2022-03-22 DIAGNOSIS — Z23 Encounter for immunization: Secondary | ICD-10-CM | POA: Diagnosis not present

## 2022-03-22 DIAGNOSIS — E6609 Other obesity due to excess calories: Secondary | ICD-10-CM | POA: Diagnosis not present

## 2022-03-22 DIAGNOSIS — E782 Mixed hyperlipidemia: Secondary | ICD-10-CM | POA: Diagnosis not present

## 2022-05-08 ENCOUNTER — Encounter: Payer: Self-pay | Admitting: Internal Medicine

## 2022-05-25 IMAGING — DX DG CERVICAL SPINE COMPLETE 4+V
6 series · 6 of 6 positions shown · non-contrast
Comparison: None.

CLINICAL DATA: Neck pain

EXAM:
CERVICAL SPINE - COMPLETE 4+ VIEW

[c-spine lat]
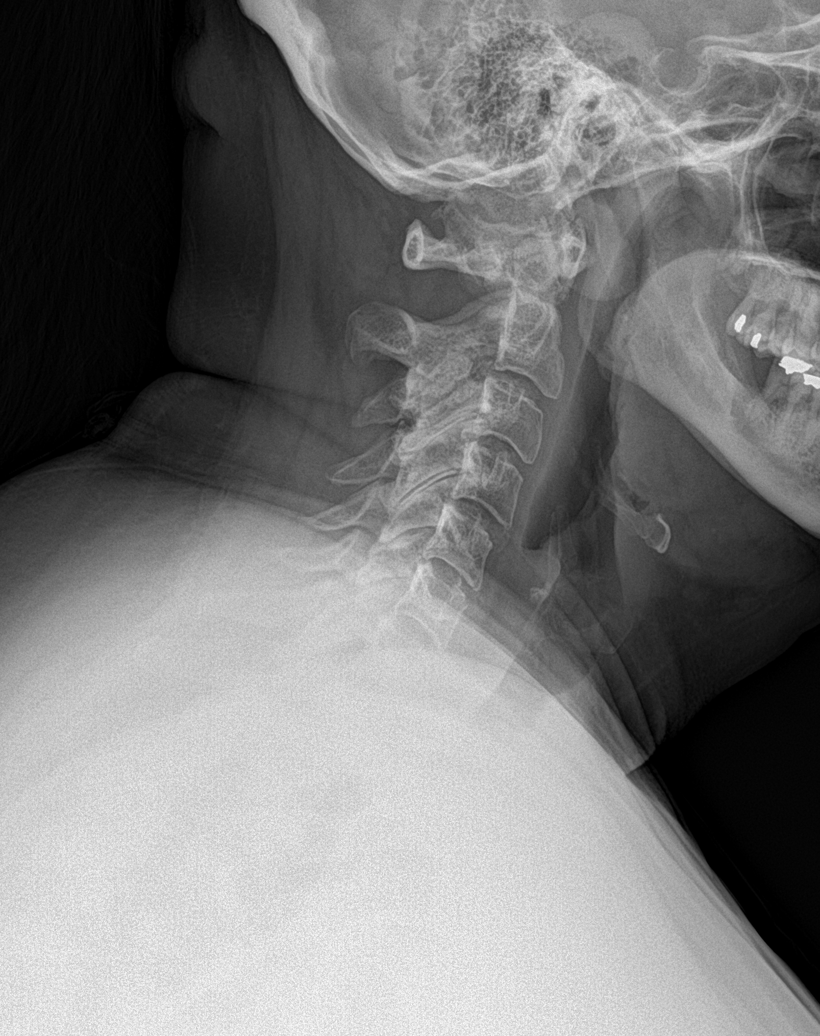

[c-spine obl (1 of 2)]
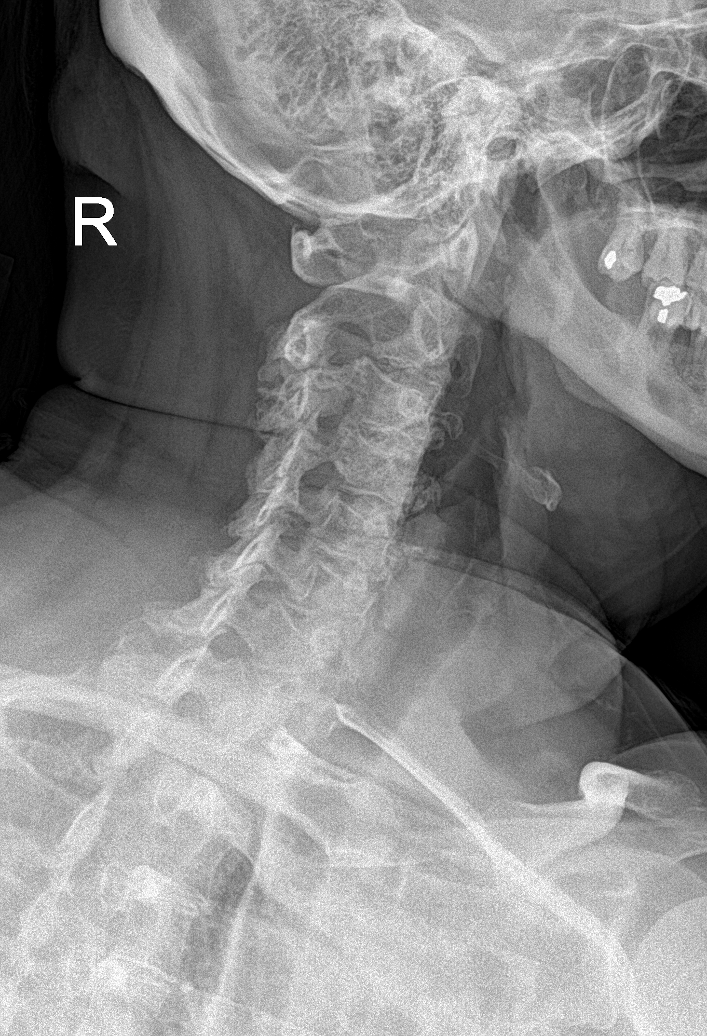

[c-spine obl (2 of 2)]
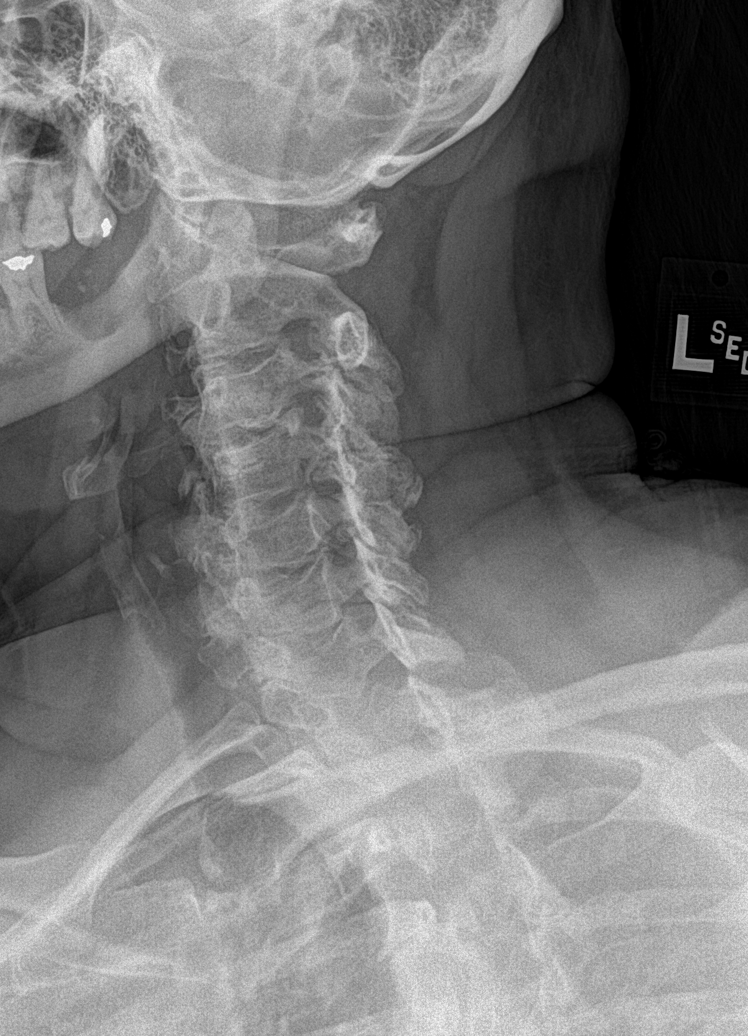

[c-spine ap]
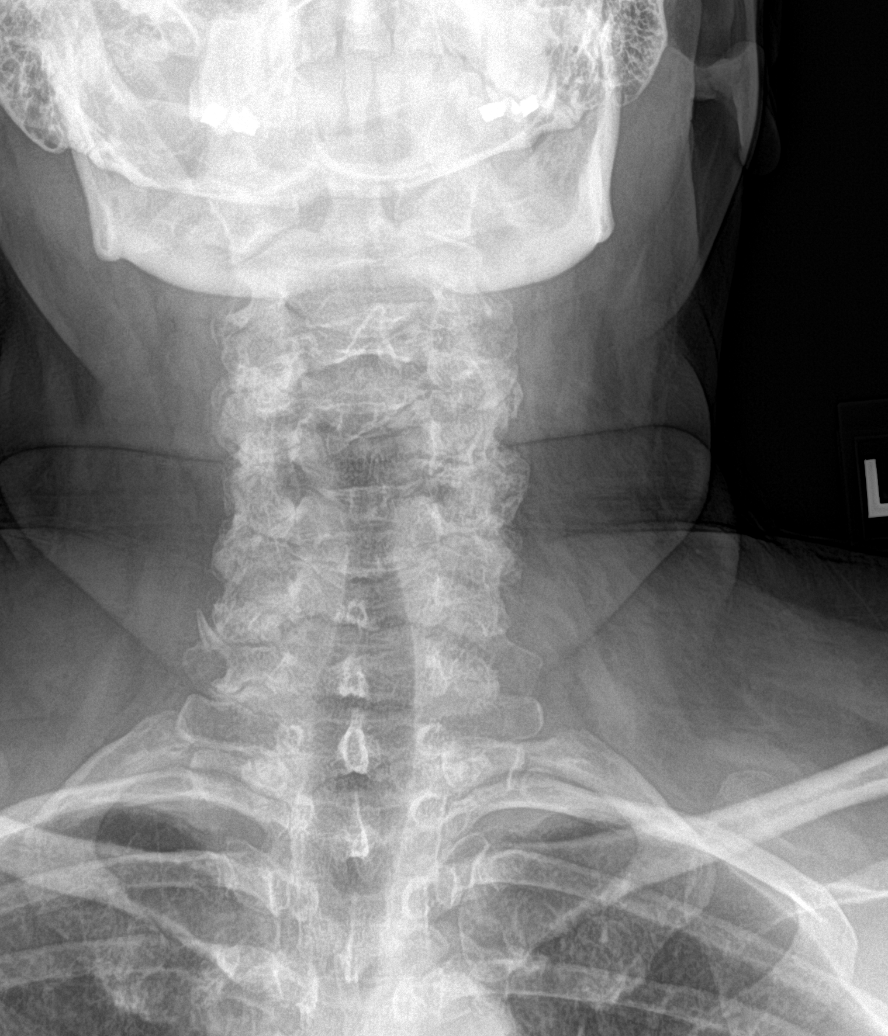

[c-spine open mouth]
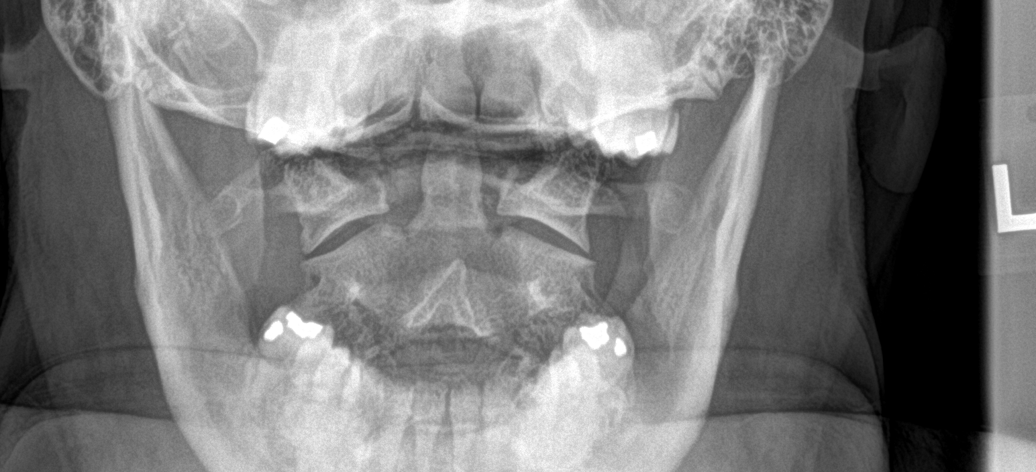

[c-spine swimmers]
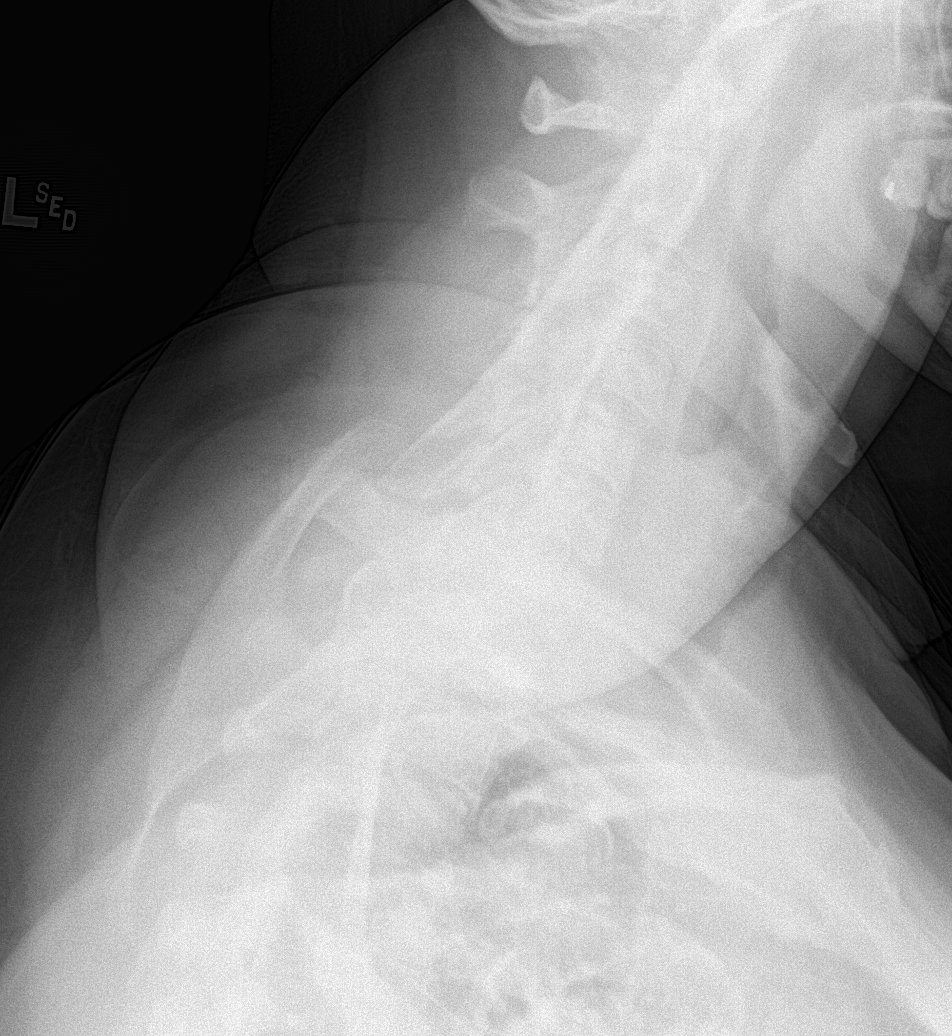

[6 of 6 positions shown; findings below may reference images not displayed]

FINDINGS: Normal cervical lordosis. No acute fracture or listhesis of the
cervical spine. Vertebral body height and intervertebral disc
heights are preserved. The prevertebral soft tissues are not
thickened. Spinal canal is widely patent. Oblique views demonstrate
multilevel facet and uncovertebral arthrosis on the right, most
severe at C2-3 and C3-4 with resultant mild neuroforaminal narrowing
at C3-4. On the left, multilevel uncovertebral and facet arthrosis
is again identified, with severe facet arthrosis noted at C2-C6.
There is resultant moderate neuroforaminal narrowing at C4-5. Mild
neuroforaminal narrowing at C2-3, C3-4 and C5-6.
IMPRESSION: Multilevel degenerative joint disease resulting in bilateral
neuroforaminal narrowing as outlined above.

No acute fracture or listhesis.

## 2022-06-28 ENCOUNTER — Other Ambulatory Visit: Payer: Self-pay | Admitting: Internal Medicine

## 2022-06-29 NOTE — Telephone Encounter (Signed)
Requested Prescriptions  Pending Prescriptions Disp Refills  . albuterol (VENTOLIN HFA) 108 (90 Base) MCG/ACT inhaler [Pharmacy Med Name: ALBUTEROL SULFATE HFA 108 108 (90 BAS Aerosol] 8.5 g 0    Sig: INHALE 2 PUFFS INTO THE LUNGS EVERY 6 HOURS AS NEEDED FOR WHEEZING OR SHORTNESS OF BREATH.     Pulmonology:  Beta Agonists 2 Passed - 06/28/2022  6:16 PM      Passed - Last BP in normal range    BP Readings from Last 1 Encounters:  02/23/22 134/82         Passed - Last Heart Rate in normal range    Pulse Readings from Last 1 Encounters:  02/23/22 86         Passed - Valid encounter within last 12 months    Recent Outpatient Visits          4 months ago Encounter for general adult medical examination with abnormal findings   [facility] [place], Salvadore Oxford, NP   8 months ago Neck pain   [facility] [place], Salvadore Oxford, [place]

## 2022-08-16 ENCOUNTER — Ambulatory Visit
Admission: RE | Admit: 2022-08-16 | Discharge: 2022-08-16 | Disposition: A | Discharge status: Home / Self Care | Payer: BC Managed Care – PPO | Source: Ambulatory Visit | Attending: Internal Medicine | Admitting: Internal Medicine

## 2022-08-16 DIAGNOSIS — Z1231 Encounter for screening mammogram for malignant neoplasm of breast: Secondary | ICD-10-CM | POA: Diagnosis not present

## 2022-08-16 DIAGNOSIS — Z780 Asymptomatic menopausal state: Secondary | ICD-10-CM

## 2022-08-23 ENCOUNTER — Other Ambulatory Visit: Payer: Self-pay | Admitting: Internal Medicine

## 2022-08-23 NOTE — Telephone Encounter (Signed)
Requested Prescriptions  Pending Prescriptions Disp Refills   albuterol (VENTOLIN HFA) 108 (90 Base) MCG/ACT inhaler [Pharmacy Med Name: ALBUTEROL SULF HFA 90 MCG 108 (90 BAS Aerosol] 8.5 g 0    Sig: INHALE 2 PUFFS INTO THE LUNGS EVERY 6 HOURS AS NEEDED FOR WHEEZING OR SHORTNESS OF BREATH.     Pulmonology:  Beta Agonists 2 Passed - 08/23/2022 12:57 PM      Passed - Last BP in normal range    BP Readings from Last 1 Encounters:  02/23/22 134/82         Passed - Last Heart Rate in normal range    Pulse Readings from Last 1 Encounters:  02/23/22 86         Passed - Valid encounter within last 12 months    Recent Outpatient Visits           6 months ago Encounter for general adult medical examination with abnormal findings   [facility] [place], Salvadore Oxford, NP   10 months ago Neck pain   [facility] [place], Salvadore Oxford, [place]

## 2022-09-21 ENCOUNTER — Other Ambulatory Visit: Payer: Self-pay | Admitting: Internal Medicine

## 2022-09-21 DIAGNOSIS — Z0001 Encounter for general adult medical examination with abnormal findings: Secondary | ICD-10-CM

## 2022-09-21 NOTE — Telephone Encounter (Signed)
Requested Prescriptions  Pending Prescriptions Disp Refills   sertraline (ZOLOFT) 25 MG tablet [Pharmacy Med Name: SERTRALINE HCL 25 MG TABS 25 Tablet] 90 tablet 0    Sig: TAKE 1 TABLET (25 MG TOTAL) BY MOUTH DAILY.     Psychiatry:  Antidepressants - SSRI - sertraline Failed - 09/21/2022  9:49 AM      Failed - Valid encounter within last 6 months    Recent Outpatient Visits           7 months ago Encounter for general adult medical examination with abnormal findings   [facility] [place], Salvadore Oxford, NP   11 months ago Neck pain   [facility] [place], [place] W, NP              Passed - AST in normal range and within 360 days    AST  Date Value Ref Range Status  02/23/2022 12 10 - 35 U/L Final         Passed - ALT in normal range and within 360 days    ALT  Date Value Ref Range Status  02/23/2022 16 6 - 29 U/L Final         Passed - Completed PHQ-2 or PHQ-9 in the last 360 days

## 2022-09-21 NOTE — Telephone Encounter (Signed)
Pt needs appt. Left VM to call back to secure appt. 

## 2022-09-22 ENCOUNTER — Other Ambulatory Visit: Payer: Self-pay | Admitting: Internal Medicine

## 2022-09-22 DIAGNOSIS — Z0001 Encounter for general adult medical examination with abnormal findings: Secondary | ICD-10-CM

## 2022-09-25 NOTE — Telephone Encounter (Signed)
Requested Prescriptions  Pending Prescriptions Disp Refills   diclofenac (VOLTAREN) 75 MG EC tablet [Pharmacy Med Name: DICLOFENAC SOD DR 75 MG TAB 75 Tablet] 180 tablet 0    Sig: TAKE 1 TABLET BY MOUTH 2 TIMES DAILY.     Analgesics:  NSAIDS Failed - 09/22/2022  4:33 PM      Failed - Manual Review: Labs are only required if the patient has taken medication for more than 8 weeks.      Passed - Cr in normal range and within 360 days    Creat  Date Value Ref Range Status  02/23/2022 0.76 0.50 - 1.03 mg/dL Final         Passed - HGB in normal range and within 360 days    Hemoglobin  Date Value Ref Range Status  02/23/2022 14.5 11.7 - 15.5 g/dL Final         Passed - PLT in normal range and within 360 days    Platelets  Date Value Ref Range Status  02/23/2022 289 140 - 400 Thousand/uL Final         Passed - HCT in normal range and within 360 days    HCT  Date Value Ref Range Status  02/23/2022 45.0 35.0 - 45.0 % Final         Passed - eGFR is 30 or above and within 360 days    GFR calc Af Amer  Date Value Ref Range Status  05/16/2007 77 mL/min Final   GFR calc non Af Amer  Date Value Ref Range Status  04/08/2010 81.49 >60 mL/min Final   GFR  Date Value Ref Range Status  01/06/2021 67.81 >60.00 mL/min Final    Comment:    Calculated using the CKD-EPI Creatinine Equation (2021)   eGFR  Date Value Ref Range Status  02/23/2022 90 > OR = 60 mL/min/1.64m Final    Comment:    The eGFR is based on the CKD-EPI 2021 equation. To calculate  the new eGFR from a previous Creatinine or Cystatin C result, go to https://www.kidney.org/professionals/ kdoqi/gfr%5Fcalculator          Passed - Patient is not pregnant      Passed - Valid encounter within last 12 months    Recent Outpatient Visits           7 months ago Encounter for general adult medical examination with abnormal findings   [place]   11 months ago [facility]   [facility] [place]

## 2022-10-10 ENCOUNTER — Other Ambulatory Visit: Payer: Self-pay | Admitting: Internal Medicine

## 2022-10-11 ENCOUNTER — Other Ambulatory Visit: Payer: Self-pay | Admitting: Internal Medicine

## 2022-10-11 NOTE — Telephone Encounter (Signed)
Requested Prescriptions  Pending Prescriptions Disp Refills   albuterol (VENTOLIN HFA) 108 (90 Base) MCG/ACT inhaler [Pharmacy Med Name: ALBUTEROL SULF HFA 90 MCG 108 (90 BAS Aerosol] 8.5 g 0    Sig: INHALE 2 PUFFS INTO THE LUNGS EVERY 6 HOURS AS NEEDED FOR WHEEZING OR SHORTNESS OF BREATH.     Pulmonology:  Beta Agonists 2 Passed - 10/10/2022 12:59 PM      Passed - Last BP in normal range    BP Readings from Last 1 Encounters:  02/23/22 134/82         Passed - Last Heart Rate in normal range    Pulse Readings from Last 1 Encounters:  02/23/22 86         Passed - Valid encounter within last 12 months    Recent Outpatient Visits           7 months ago Encounter for general adult medical examination with abnormal findings   [facility] [place], Salvadore Oxford, NP   1 year ago Neck pain   [facility] [place], Salvadore Oxford, [place]

## 2022-10-27 ENCOUNTER — Encounter: Payer: Self-pay | Admitting: Internal Medicine

## 2022-11-07 ENCOUNTER — Other Ambulatory Visit: Payer: Self-pay | Admitting: Internal Medicine

## 2022-11-07 DIAGNOSIS — Z0001 Encounter for general adult medical examination with abnormal findings: Secondary | ICD-10-CM

## 2022-11-18 ENCOUNTER — Other Ambulatory Visit: Payer: Self-pay | Admitting: Internal Medicine

## 2022-11-20 NOTE — Telephone Encounter (Signed)
Requested medication (s) are due for refill today: no   Requested medication (s) are on the active medication list: yes   Last refill:  10/11/22 #8.5g 0 refills  Future visit scheduled: no   Notes to clinic:  no refills remain. Do you want to refill Rx?     Requested Prescriptions  Pending Prescriptions Disp Refills   albuterol (VENTOLIN HFA) 108 (90 Base) MCG/ACT inhaler [Pharmacy Med Name: ALBUTEROL SULF HFA 90 MCG 108 (90 BAS Aerosol] 8.5 g 0    Sig: INHALE 2 PUFFS INTO THE LUNGS EVERY 6 HOURS AS NEEDED FOR WHEEZING OR SHORTNESS OF BREATH.     Pulmonology:  Beta Agonists 2 Passed - 11/18/2022  9:02 AM      Passed - Last BP in normal range    BP Readings from Last 1 Encounters:  02/23/22 134/82         Passed - Last Heart Rate in normal range    Pulse Readings from Last 1 Encounters:  02/23/22 86         Passed - Valid encounter within last 12 months    Recent Outpatient Visits           9 months ago Encounter for general adult medical examination with abnormal findings   [facility] [place], Coralie Keens, NP   1 year ago Neck pain   [facility] [place], Coralie Keens, [place]

## 2022-11-24 DIAGNOSIS — M25562 Pain in left knee: Secondary | ICD-10-CM | POA: Diagnosis not present

## 2022-11-24 DIAGNOSIS — M170 Bilateral primary osteoarthritis of knee: Secondary | ICD-10-CM | POA: Diagnosis not present

## 2022-11-24 DIAGNOSIS — M25561 Pain in right knee: Secondary | ICD-10-CM | POA: Diagnosis not present

## 2022-12-13 ENCOUNTER — Other Ambulatory Visit: Payer: Self-pay | Admitting: Internal Medicine

## 2022-12-13 DIAGNOSIS — Z0001 Encounter for general adult medical examination with abnormal findings: Secondary | ICD-10-CM

## 2022-12-13 NOTE — Telephone Encounter (Signed)
Requested Prescriptions  Pending Prescriptions Disp Refills   diclofenac (VOLTAREN) 75 MG EC tablet [Pharmacy Med Name: DICLOFENAC SOD DR 75 MG TAB 75 Tablet] 180 tablet 0    Sig: TAKE 1 TABLET BY MOUTH 2 TIMES DAILY.     Analgesics:  NSAIDS Failed - 12/13/2022 11:29 AM      Failed - Manual Review: Labs are only required if the patient has taken medication for more than 8 weeks.      Passed - Cr in normal range and within 360 days    Creat  Date Value Ref Range Status  02/23/2022 0.76 0.50 - 1.03 mg/dL Final         Passed - HGB in normal range and within 360 days    Hemoglobin  Date Value Ref Range Status  02/23/2022 14.5 11.7 - 15.5 g/dL Final         Passed - PLT in normal range and within 360 days    Platelets  Date Value Ref Range Status  02/23/2022 289 140 - 400 Thousand/uL Final         Passed - HCT in normal range and within 360 days    HCT  Date Value Ref Range Status  02/23/2022 45.0 35.0 - 45.0 % Final         Passed - eGFR is 30 or above and within 360 days    GFR calc Af Amer  Date Value Ref Range Status  05/16/2007 77 mL/min Final   GFR calc non Af Amer  Date Value Ref Range Status  04/08/2010 81.49 >60 mL/min Final   GFR  Date Value Ref Range Status  01/06/2021 67.81 >60.00 mL/min Final    Comment:    Calculated using the CKD-EPI Creatinine Equation (2021)   eGFR  Date Value Ref Range Status  02/23/2022 90 > OR = 60 mL/min/1.43m Final    Comment:    The eGFR is based on the CKD-EPI 2021 equation. To calculate  the new eGFR from a previous Creatinine or Cystatin C result, go to https://www.kidney.org/professionals/ kdoqi/gfr%5Fcalculator          Passed - Patient is not pregnant      Passed - Valid encounter within last 12 months    Recent Outpatient Visits           9 months ago Encounter for general adult medical examination with abnormal findings   [facility]   1 year ago [facility]ck pain    [facility] [place]

## 2023-01-10 ENCOUNTER — Other Ambulatory Visit: Payer: Self-pay | Admitting: Internal Medicine

## 2023-01-10 DIAGNOSIS — Z0001 Encounter for general adult medical examination with abnormal findings: Secondary | ICD-10-CM

## 2023-01-10 NOTE — Telephone Encounter (Signed)
Requested medication (s) are due for refill today: yes  Requested medication (s) are on the active medication list: yes  Last refill:  09/21/22 #90 0 refills  Future visit scheduled: no   Notes to clinic:   protocol failed. No valid encounter within 10 months. Last OV 02/23/22 note recommended 6 month f/u. No refills remain. Do you want to give courtesy refill? Called patient to schedule appt no answer, LVMTCB     Requested Prescriptions  Pending Prescriptions Disp Refills   sertraline (ZOLOFT) 25 MG tablet [Pharmacy Med Name: SERTRALINE HCL 25 MG TABS 25 Tablet] 90 tablet 0    Sig: TAKE 1 TABLET (25 MG TOTAL) BY MOUTH DAILY.     Psychiatry:  Antidepressants - SSRI - sertraline Failed - 01/10/2023  9:27 AM      Failed - Valid encounter within last 6 months    Recent Outpatient Visits           10 months ago Encounter for general adult medical examination with abnormal findings   [facility] [place], Coralie Keens, NP   1 year ago Neck pain   [facility] [place], [place] W, NP              Passed - AST in normal range and within 360 days    AST  Date Value Ref Range Status  02/23/2022 12 10 - 35 U/L Final         Passed - ALT in normal range and within 360 days    ALT  Date Value Ref Range Status  02/23/2022 16 6 - 29 U/L Final         Passed - Completed PHQ-2 or PHQ-9 in the last 360 days

## 2023-01-10 NOTE — Telephone Encounter (Signed)
Called patient to scheduled appt for medication refills and f/u visit for chronic conditions. No answer, LVMTCB [phone].

## 2023-01-29 ENCOUNTER — Ambulatory Visit: Payer: Self-pay | Admitting: Internal Medicine

## 2023-01-29 NOTE — Progress Notes (Deleted)
Subjective:    Patient ID: Jamie Rowland, female    DOB: [birth year], 61 y.o.   MRN: [id number]  HPI  Patient presents to clinic today with complaint of a rash around her ankle.  She noticed this.  Review of Systems     Past Medical History:  Diagnosis Date   Asthma    Bronchitis    Migraine headache    Seasonal allergies     Current Outpatient Medications  Medication Sig Dispense Refill   albuterol (VENTOLIN HFA) 108 (90 Base) MCG/ACT inhaler INHALE 2 PUFFS INTO THE LUNGS EVERY 6 HOURS AS NEEDED FOR WHEEZING OR SHORTNESS OF BREATH. 8.5 g 0   atorvastatin (LIPITOR) 10 MG tablet Take 1 tablet (10 mg total) by mouth 3 (three) times a week. 36 tablet 0   diclofenac (VOLTAREN) 75 MG EC tablet TAKE 1 TABLET BY MOUTH 2 TIMES DAILY. 180 tablet 0   montelukast (SINGULAIR) 10 MG tablet TAKE 1 TABLET (10 MG TOTAL) BY MOUTH AT BEDTIME. 90 tablet 1   MYRBETRIQ 25 MG TB24 tablet TAKE 1 TABLET (25 MG TOTAL) BY MOUTH DAILY. 30 tablet 1   sertraline (ZOLOFT) 25 MG tablet TAKE 1 TABLET (25 MG TOTAL) BY MOUTH DAILY. 90 tablet 0   SUMAtriptan (IMITREX) 25 MG tablet May repeat in 2 hours if headache persists or recurs. 10 tablet 5   No current facility-administered medications for this visit.    Allergies  Allergen Reactions   Codeine Sulfate     REACTION: Hives, can't breathe   Latex Hives    Family History  Problem Relation Age of Onset   Hypertension Mother    Diverticulitis Mother    Fibromyalgia Mother    Heart failure Mother    Uterine cancer Sister        uterine   Asthma Maternal Grandmother    Heart disease Maternal Grandmother    Emphysema Maternal Grandmother        smoked   Diabetes Maternal Uncle    Heart disease Maternal Uncle    Colon cancer Neg Hx    Esophageal cancer Neg Hx    Rectal cancer Neg Hx    Stomach cancer Neg Hx    Breast cancer Neg Hx     Social History   Socioeconomic History   Marital status: Married    Spouse name: Not on file   Number of  children: 3   Years of education: Not on file   Highest education level: Not on file  Occupational History   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  Tobacco Use   Smoking status: Never   Smokeless tobacco: Never  Vaping Use   Vaping Use: Never used  Substance and Sexual Activity   Alcohol use: No   Drug use: No   Sexual activity: Yes  Other Topics Concern   Not on file  Social History Narrative   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  HEENT: Denies eye pain, eye redness,  ear pain, ringing in the ears, wax buildup, runny nose, nasal congestion, bloody nose, or sore throat. Respiratory: Denies difficulty breathing, shortness of breath, cough or sputum production.   Cardiovascular: Denies chest pain, chest tightness, palpitations or swelling in the hands or feet.  Gastrointestinal: Denies abdominal pain, bloating, constipation, diarrhea or blood in the stool.  GU: Denies urgency, frequency, pain with urination, burning sensation, blood in urine, odor or discharge. Musculoskeletal: Denies decrease in range of motion, difficulty with gait, muscle pain or joint pain and swelling.  Skin: Patient reports rash around ankles.  Denies ulcercations.  Neurological: Denies dizziness, difficulty with memory, difficulty with speech or problems with balance and coordination.  Psych: Denies anxiety, depression, SI/HI.  No other specific complaints in a complete review of systems (except as listed in HPI  above).  Objective:   Physical Exam   There were no vitals taken for this visit. Wt Readings from Last 3 Encounters:  02/23/22 270 lb (122.5 kg)  10/05/21 276 lb (125.2 kg)  01/06/21 270 lb (122.5 kg)    General: Appears their stated age, well developed, well nourished in NAD. Skin: Warm, dry and intact. No rashes, lesions or ulcerations noted. HEENT: Head: normal shape and size; Eyes: sclera white, no icterus, conjunctiva pink, PERRLA and EOMs intact; Ears: Tm's gray and intact, normal light reflex; Nose: mucosa pink and moist, septum midline; Throat/Mouth: Teeth present, mucosa pink and moist, no exudate, lesions or ulcerations noted.  Neck:  Neck supple, trachea midline. No masses, lumps or thyromegaly present.  Cardiovascular: Normal rate and rhythm. S1,S2 noted.  No murmur, rubs or gallops noted. No JVD or BLE edema. No carotid bruits noted. Pulmonary/Chest: Normal effort and positive vesicular breath sounds. No respiratory distress. No wheezes, rales or ronchi noted.  Abdomen: Soft and nontender. Normal bowel sounds. No distention or masses noted. Liver, spleen and kidneys non palpable. Musculoskeletal: Normal range of motion. No signs of joint swelling. No difficulty with gait.  Neurological: Alert and oriented. Cranial nerves II-XII grossly intact. Coordination normal.  Psychiatric: Mood and affect normal. Behavior is normal. Judgment and thought content normal.    BMET    Component Value Date/Time   NA 141 02/23/2022 0843   K 4.3 02/23/2022 0843   CL 105 02/23/2022 0843   CO2 27 02/23/2022 0843   GLUCOSE 158 (H) 02/23/2022 0843   BUN 20 02/23/2022 0843   CREATININE 0.76 02/23/2022 0843   CALCIUM 9.3 02/23/2022 0843   GFRNONAA 81.49 04/08/2010 0831   GFRAA 77 05/16/2007 0947    Lipid Panel     Component Value Date/Time   CHOL 179 02/23/2022 0843   TRIG 184 (H) 02/23/2022 0843   HDL 40 (L) 02/23/2022 0843   CHOLHDL 4.5 02/23/2022 0843   VLDL 52.4 (H) 01/06/2021  1244   LDLCALC 108 (H) 02/23/2022 0843    CBC    Component Value Date/Time   WBC 9.3 02/23/2022 0843   RBC 4.68 02/23/2022 0843   HGB 14.5 02/23/2022 0843   HCT 45.0 02/23/2022 0843   PLT 289 02/23/2022 0843   MCV 96.2 02/23/2022 0843   MCH 31.0 02/23/2022 0843   MCHC 32.2 02/23/2022 0843   RDW 12.7 02/23/2022 0843   LYMPHSABS 3.3 07/04/2013 0827   MONOABS 1.0 07/04/2013 0827   EOSABS 0.5 07/04/2013 0827   BASOSABS 0.1 07/04/2013 0827    Hgb A1C Lab Results  Component Value Date   HGBA1C 5.8 (H) 02/23/2022  Assessment & Plan:    Schedule appointment for follow-up of chronic conditions Nicki Reaper, NP

## 2023-02-01 ENCOUNTER — Telehealth: Payer: Self-pay | Admitting: Internal Medicine

## 2023-02-01 ENCOUNTER — Telehealth: Payer: Self-pay

## 2023-02-01 NOTE — Telephone Encounter (Signed)
Called patient to initiate virtual visit.  Patient request visit get canceled because she is taking her niece to the hospital.  Advised patient she is due for a follow up appointment.  Patient refused because she does not want to pay for a follow up and annual.  Advised patient it is recommended to have follow up and annual and that most times insurance does not cover both at the same time.  Patient refused appointment because she does not want to pay for 2 visits.

## 2023-02-01 NOTE — Progress Notes (Deleted)
Virtual Visit via Video Note  I connected with Jamie Rowland on 02/01/23 at 11:20 AM EDT by a video enabled telemedicine application and verified that I am speaking with the correct person using two identifiers.  Location: Patient: *** Provider: Office  Persons participating in this video call: Nicki Reaper, NP and Philis Nettle   I discussed the limitations of evaluation and management by telemedicine and the availability of in person appointments. The patient expressed understanding and agreed to proceed.  History of Present Illness:  Patient reports a rash around her ankle.  She noticed this.   Past Medical History:  Diagnosis Date   Asthma    Bronchitis    Migraine headache    Seasonal allergies     Current Outpatient Medications  Medication Sig Dispense Refill   albuterol (VENTOLIN HFA) 108 (90 Base) MCG/ACT inhaler INHALE 2 PUFFS INTO THE LUNGS EVERY 6 HOURS AS NEEDED FOR WHEEZING OR SHORTNESS OF BREATH. 8.5 g 0   atorvastatin (LIPITOR) 10 MG tablet Take 1 tablet (10 mg total) by mouth 3 (three) times a week. 36 tablet 0   diclofenac (VOLTAREN) 75 MG EC tablet TAKE 1 TABLET BY MOUTH 2 TIMES DAILY. 180 tablet 0   montelukast (SINGULAIR) 10 MG tablet TAKE 1 TABLET (10 MG TOTAL) BY MOUTH AT BEDTIME. 90 tablet 1   MYRBETRIQ 25 MG TB24 tablet TAKE 1 TABLET (25 MG TOTAL) BY MOUTH DAILY. 30 tablet 1   sertraline (ZOLOFT) 25 MG tablet TAKE 1 TABLET (25 MG TOTAL) BY MOUTH DAILY. 90 tablet 0   SUMAtriptan (IMITREX) 25 MG tablet May repeat in 2 hours if headache persists or recurs. 10 tablet 5   No current facility-administered medications for this visit.    Allergies  Allergen Reactions   Codeine Sulfate     REACTION: Hives, can't breathe   Latex Hives    Family History  Problem Relation Age of Onset   Hypertension Mother    Diverticulitis Mother    Fibromyalgia Mother    Heart failure Mother    Uterine cancer Sister        uterine   Asthma Maternal Grandmother     Heart disease Maternal Grandmother    Emphysema Maternal Grandmother        smoked   Diabetes Maternal Uncle    Heart disease Maternal Uncle    Colon cancer Neg Hx    Esophageal cancer Neg Hx    Rectal cancer Neg Hx    Stomach cancer Neg Hx    Breast cancer Neg Hx     Social History   Socioeconomic History   Marital status: Married    Spouse name: Not on file   Number of children: 3   Years of education: Not on file   Highest education level: Not on file  Occupational History   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  Tobacco Use   Smoking status: Never   Smokeless tobacco: Never  Vaping Use   Vaping Use: Never used  Substance and Sexual Activity   Alcohol use: No   Drug use: No   Sexual activity: Yes  Other Topics Concern   Not on file  Social History Narrative   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  HEENT: Denies eye pain, eye redness, ear pain, ringing in the ears, wax buildup, runny nose, nasal congestion, bloody nose, or sore throat. Respiratory: Denies difficulty breathing, shortness of breath, cough or sputum production.   Cardiovascular: Denies chest pain, chest tightness, palpitations or swelling in the hands or feet.  Gastrointestinal: Denies abdominal pain, bloating, constipation, diarrhea or blood in the stool.  GU: Denies urgency, frequency, pain with urination, burning sensation, blood in urine, odor or discharge. Musculoskeletal: Denies decrease in range of motion,  difficulty with gait, muscle pain or joint pain and swelling.  Skin: Patient reports rash around her ankle.  Denies redness, lesions or ulcercations.  Neurological: Denies dizziness, difficulty with memory, difficulty with speech or problems with balance and coordination.  Psych: Denies anxiety, depression, SI/HI.  No other specific complaints in a complete review of systems (except as listed in HPI above).    Observations/Objective: There were no vitals taken for this visit. Wt Readings from Last 3 Encounters:  02/23/22 270 lb (122.5 kg)  10/05/21 276 lb (125.2 kg)  01/06/21 270 lb (122.5 kg)    General: Appears her stated age, obese in NAD. Skin:  Pulmonary/Chest: Normal effort . No respiratory distress.  Neurological: Alert and oriented.   BMET    Component Value Date/Time   NA 141 02/23/2022 0843   K 4.3 02/23/2022 0843   CL 105 02/23/2022 0843   CO2 27 02/23/2022 0843   GLUCOSE 158 (H) 02/23/2022 0843   BUN 20 02/23/2022 0843   CREATININE 0.76 02/23/2022 0843   CALCIUM 9.3 02/23/2022 0843   GFRNONAA 81.49 04/08/2010 0831   GFRAA 77 05/16/2007 0947    Lipid Panel     Component Value Date/Time   CHOL 179 02/23/2022 0843   TRIG 184 (H) 02/23/2022 0843   HDL 40 (L) 02/23/2022 0843   CHOLHDL 4.5 02/23/2022 0843   VLDL 52.4 (H) 01/06/2021 1244   LDLCALC 108 (H) 02/23/2022 0843    CBC    Component Value Date/Time   WBC 9.3 02/23/2022 0843   RBC 4.68 02/23/2022 0843   HGB 14.5 02/23/2022 0843   HCT 45.0 02/23/2022 0843   PLT 289 02/23/2022 0843   MCV 96.2 02/23/2022 0843   MCH 31.0 02/23/2022 0843   MCHC 32.2 02/23/2022 0843   RDW 12.7 02/23/2022 0843   LYMPHSABS 3.3 07/04/2013 0827   MONOABS 1.0 07/04/2013 0827   EOSABS 0.5 07/04/2013 0827   BASOSABS 0.1 07/04/2013 0827    Hgb A1C Lab Results  Component Value Date   HGBA1C 5.8 (H) 02/23/2022        Assessment and Plan:  Schedule an appointment for follow-up of chronic conditions Follow Up  Instructions:    I discussed the assessment and treatment plan with the patient. The patient was provided an opportunity to ask questions and all were answered. The patient agreed with the plan and demonstrated an understanding of the instructions.   The patient was advised to call back or seek an in-person evaluation if the symptoms worsen or if the condition fails to improve as anticipated.    Nicki Reaper, NP

## 2023-02-13 ENCOUNTER — Encounter: Payer: Self-pay | Admitting: Internal Medicine

## 2023-02-13 ENCOUNTER — Ambulatory Visit: Payer: BC Managed Care – PPO | Admitting: Internal Medicine

## 2023-02-13 VITALS — BP 136/82 | HR 102 | Temp 96.5°F | Wt 295.0 lb

## 2023-02-13 DIAGNOSIS — Z0001 Encounter for general adult medical examination with abnormal findings: Secondary | ICD-10-CM

## 2023-02-13 DIAGNOSIS — Z6841 Body Mass Index (BMI) 40.0 and over, adult: Secondary | ICD-10-CM

## 2023-02-13 DIAGNOSIS — E782 Mixed hyperlipidemia: Secondary | ICD-10-CM | POA: Diagnosis not present

## 2023-02-13 DIAGNOSIS — K219 Gastro-esophageal reflux disease without esophagitis: Secondary | ICD-10-CM | POA: Diagnosis not present

## 2023-02-13 DIAGNOSIS — F411 Generalized anxiety disorder: Secondary | ICD-10-CM | POA: Diagnosis not present

## 2023-02-13 DIAGNOSIS — J4530 Mild persistent asthma, uncomplicated: Secondary | ICD-10-CM | POA: Diagnosis not present

## 2023-02-13 DIAGNOSIS — N3946 Mixed incontinence: Secondary | ICD-10-CM

## 2023-02-13 DIAGNOSIS — R7303 Prediabetes: Secondary | ICD-10-CM | POA: Diagnosis not present

## 2023-02-13 DIAGNOSIS — G43C1 Periodic headache syndromes in child or adult, intractable: Secondary | ICD-10-CM

## 2023-02-13 DIAGNOSIS — M170 Bilateral primary osteoarthritis of knee: Secondary | ICD-10-CM

## 2023-02-13 MED ORDER — DICLOFENAC SODIUM 75 MG PO TBEC: 75.0000 mg | DELAYED_RELEASE_TABLET | Freq: Two times a day (BID) | ORAL | 1 refills | Status: DC

## 2023-02-13 MED ORDER — SUMATRIPTAN SUCCINATE 25 MG PO TABS
ORAL_TABLET | ORAL | 5 refills | Status: DC
Start: 2023-02-13 — End: 2023-10-02

## 2023-02-13 MED ORDER — MONTELUKAST SODIUM 10 MG PO TABS
10.0000 mg | ORAL_TABLET | Freq: Every day | ORAL | 1 refills | Status: DC
Start: 2023-02-13 — End: 2023-12-28

## 2023-02-13 MED ORDER — ATORVASTATIN CALCIUM 10 MG PO TABS: 10.0000 mg | ORAL_TABLET | ORAL | 1 refills | Status: DC

## 2023-02-13 MED ORDER — ALBUTEROL SULFATE HFA 108 (90 BASE) MCG/ACT IN AERS: 2.0000 | INHALATION_SPRAY | Freq: Four times a day (QID) | RESPIRATORY_TRACT | 2 refills | Status: DC | PRN

## 2023-02-13 MED ORDER — SERTRALINE HCL 25 MG PO TABS
25.0000 mg | ORAL_TABLET | Freq: Every day | ORAL | 1 refills | Status: DC
Start: 2023-02-13 — End: 2023-09-06

## 2023-02-13 NOTE — Assessment & Plan Note (Signed)
Continue montelukast and albuterol

## 2023-02-13 NOTE — Assessment & Plan Note (Signed)
A1c today Encourage low-carb diet and exercise for weight loss 

## 2023-02-13 NOTE — Patient Instructions (Signed)

## 2023-02-13 NOTE — Assessment & Plan Note (Signed)
Managing this off Myrbetriq Hard time voiding and Kegel exercises

## 2023-02-13 NOTE — Assessment & Plan Note (Signed)
Avoid foods that trigger reflux Encouraged weight loss as this can help reduce reflux symptoms Continue famotidine as needed

## 2023-02-13 NOTE — Assessment & Plan Note (Signed)
Trying to avoid knee replacements Encourage weight loss as this can help reduce joint pain Continue diclofenac

## 2023-02-13 NOTE — Assessment & Plan Note (Signed)
Try to identify triggers and avoid them Imitrex refilled today

## 2023-02-13 NOTE — Assessment & Plan Note (Signed)
Would like to restart sertraline, refill today Support offered

## 2023-02-13 NOTE — Progress Notes (Signed)
Subjective:    Patient ID: Jamie Rowland, female    DOB: 1961/05/06, 62 y.o.   MRN: [id number]  HPI  Patient presents to clinic today for follow-up of chronic conditions.  Asthma: Moderate, persistent.  Managed on Albuterol as needed.  There are no PFTs on file.  She no longer follows with pulmonology.  Migraines: These occur once every 2 months.  She is not sure what triggers. She takes Imitrex as needed with good relief of symptoms.  She does not follow with neurology.  GERD: Triggered by spicy and greasy foods.  She takes Famotidine as needed with good results.  There is no upper GI on file.  OA: Mainly in her knees.  She takes Diclofenac as needed with good results.  She follows with orthopedics.  OAB: She reports mainly urinary urgency.  She is no longer taking Myrbetriq as prescribed.  She does not follow with urology.  Anxiety: Persistent, managed on Sertraline. But she reports he has been out for the last month.  She is not currently seeing a therapist.  She denies depression, SI/HI.  HLD: Her last LDL was 108, triglycerides 184, 02/2022.  She denies myalgias on Atorvastatin.  She tries to consume low-fat diet.  Prediabetes: Her last A1c was 5.8%, 02/2022.  She is not taking any oral diabetic medication at this time.  She does not check her sugars.  Review of Systems     Past Medical History:  Diagnosis Date   Asthma    Bronchitis    Migraine headache    Seasonal allergies     Current Outpatient Medications  Medication Sig Dispense Refill   albuterol (VENTOLIN HFA) 108 (90 Base) MCG/ACT inhaler INHALE 2 PUFFS INTO THE LUNGS EVERY 6 HOURS AS NEEDED FOR WHEEZING OR SHORTNESS OF BREATH. 8.5 g 0   atorvastatin (LIPITOR) 10 MG tablet Take 1 tablet (10 mg total) by mouth 3 (three) times a week. 36 tablet 0   diclofenac (VOLTAREN) 75 MG EC tablet TAKE 1 TABLET BY MOUTH 2 TIMES DAILY. 180 tablet 0   montelukast (SINGULAIR) 10 MG tablet TAKE 1 TABLET (10 MG TOTAL) BY MOUTH AT  BEDTIME. 90 tablet 1   MYRBETRIQ 25 MG TB24 tablet TAKE 1 TABLET (25 MG TOTAL) BY MOUTH DAILY. 30 tablet 1   sertraline (ZOLOFT) 25 MG tablet TAKE 1 TABLET (25 MG TOTAL) BY MOUTH DAILY. 90 tablet 0   SUMAtriptan (IMITREX) 25 MG tablet May repeat in 2 hours if headache persists or recurs. 10 tablet 5   No current facility-administered medications for this visit.    Allergies  Allergen Reactions   Codeine Sulfate     REACTION: Hives, can't breathe   Latex Hives    Family History  Problem Relation Age of Onset   Hypertension Mother    Diverticulitis Mother    Fibromyalgia Mother    Heart failure Mother    Uterine cancer Sister        uterine   Asthma Maternal Grandmother    Heart disease Maternal Grandmother    Emphysema Maternal Grandmother        smoked   Diabetes Maternal Uncle    Heart disease Maternal Uncle    Colon cancer Neg Hx    Esophageal cancer Neg Hx    Rectal cancer Neg Hx    Stomach cancer Neg Hx    Breast cancer Neg Hx     Social History   Socioeconomic History   Marital status: Married  Spouse name: Not on file   Number of children: 3   Years of education: Not on file   Highest education level: Not on file  Occupational History   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  Tobacco Use   Smoking status: Never   Smokeless tobacco: Never  Vaping Use   Vaping Use: Never used  Substance and Sexual Activity   Alcohol use: No   Drug use: No   Sexual activity: Yes  Other Topics Concern   Not on file  Social History Narrative   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  Denies fever, malaise, fatigue, or abrupt weight changes.  HEENT: Denies eye pain, eye redness, ear pain, ringing in the ears, wax buildup, runny nose, nasal congestion, bloody nose, or sore throat. Respiratory: Denies difficulty breathing, shortness of breath, cough or sputum production.   Cardiovascular: Pt reports intermittent swelling in feet. Denies chest pain, chest tightness, palpitations or swelling in the hands.  Gastrointestinal: Denies abdominal pain, bloating, constipation, diarrhea or blood in the stool.  GU: Patient reports urinary urgency.  Denies frequency, pain with urination, burning sensation, blood in urine, odor or discharge. Musculoskeletal: Patient reports knee pain.  Denies decrease in range of motion, difficulty with gait, muscle pain or joint swelling.  Skin: Denies redness, rashes, lesions or ulcercations.  Neurological: Denies dizziness, difficulty with memory, difficulty with speech or problems with balance and coordination.  Psych: Patient has a history of anxiety.  Denies depression, SI/HI.  No other specific complaints in a complete review of systems (except as listed in HPI above).  Objective:   Physical Exam   BP 136/82 (BP Location: Left Arm, Patient Position: Sitting, Cuff Size: Large)   Pulse (!) 102   Temp (!) 96.5 F (35.8 C) (Temporal)   Wt 295 lb (133.8 kg)   SpO2 96%   BMI 49.09 kg/m   Wt Readings from Last 3 Encounters:  02/23/22 270 lb (122.5 kg)  10/05/21 276 lb (125.2 kg)  01/06/21 270 lb (122.5 kg)    General: Appears her stated age, obese in NAD. Skin: Warm, dry and intact. HEENT: Head: normal shape and size; Eyes: sclera white, no icterus, conjunctiva pink, PERRLA and EOMs intact;  Cardiovascular: With normal rhythm. S1,S2 noted.  No murmur, rubs or gallops noted. No JVD.  Trace RLE edema. No carotid bruits noted. Pulmonary/Chest: Normal effort and positive  vesicular breath sounds. No respiratory distress. No wheezes, rales or ronchi noted.  Abdomen: Soft and nontender. Normal bowel sounds.  Musculoskeletal: Joint swelling of bilateral knees noted.  No difficulty with gait.  Neurological: Alert and oriented. Cranial nerves II-XII grossly intact. Coordination normal.  Psychiatric: Mood and affect normal. Behavior is normal. Judgment and thought content normal.    BMET    Component Value Date/Time   NA 141 02/23/2022 0843   K 4.3 02/23/2022 0843   CL 105 02/23/2022 0843   CO2 27 02/23/2022 0843   GLUCOSE 158 (H) 02/23/2022 0843   BUN 20 02/23/2022 0843  CREATININE 0.76 02/23/2022 0843   CALCIUM 9.3 02/23/2022 0843   GFRNONAA 81.49 04/08/2010 0831   GFRAA 77 05/16/2007 0947    Lipid Panel     Component Value Date/Time   CHOL 179 02/23/2022 0843   TRIG 184 (H) 02/23/2022 0843   HDL 40 (L) 02/23/2022 0843   CHOLHDL 4.5 02/23/2022 0843   VLDL 52.4 (H) 01/06/2021 1244   LDLCALC 108 (H) 02/23/2022 0843    CBC    Component Value Date/Time   WBC 9.3 02/23/2022 0843   RBC 4.68 02/23/2022 0843   HGB 14.5 02/23/2022 0843   HCT 45.0 02/23/2022 0843   PLT 289 02/23/2022 0843   MCV 96.2 02/23/2022 0843   MCH 31.0 02/23/2022 0843   MCHC 32.2 02/23/2022 0843   RDW 12.7 02/23/2022 0843   LYMPHSABS 3.3 07/04/2013 0827   MONOABS 1.0 07/04/2013 0827   EOSABS 0.5 07/04/2013 0827   BASOSABS 0.1 07/04/2013 0827    Hgb A1C Lab Results  Component Value Date   HGBA1C 5.8 (H) 02/23/2022           Assessment & Plan:    RTC in 6 months for annual exam Nicki Reaper, NP

## 2023-02-13 NOTE — Assessment & Plan Note (Signed)
Encouraged diet and exercise for weight loss ?

## 2023-02-13 NOTE — Assessment & Plan Note (Signed)
C-Met and lipid profile today Encouraged her to consume low-fat diet Continue atorvastatin 

## 2023-02-14 ENCOUNTER — Encounter: Payer: Self-pay | Admitting: Internal Medicine

## 2023-02-14 LAB — COMPLETE METABOLIC PANEL WITH GFR
AG Ratio: 1.4 (calc) (ref 1.0–2.5)
ALT: 18 U/L (ref 6–29)
AST: 10 U/L (ref 10–35)
Albumin: 4 g/dL (ref 3.6–5.1)
Alkaline phosphatase (APISO): 63 U/L (ref 37–153)
BUN: 20 mg/dL (ref 7–25)
CO2: 30 mmol/L (ref 20–32)
Calcium: 9.5 mg/dL (ref 8.6–10.4)
Chloride: 105 mmol/L (ref 98–110)
Creat: 0.91 mg/dL (ref 0.50–1.05)
Globulin: 2.9 g/dL (calc) (ref 1.9–3.7)
Glucose, Bld: 138 mg/dL (ref 65–139)
Potassium: 4.3 mmol/L (ref 3.5–5.3)
Sodium: 142 mmol/L (ref 135–146)
Total Bilirubin: 0.4 mg/dL (ref 0.2–1.2)
Total Protein: 6.9 g/dL (ref 6.1–8.1)
eGFR: 72 mL/min/{1.73_m2} (ref >=60)

## 2023-02-14 LAB — LIPID PANEL
Cholesterol: 144 mg/dL (ref <200)
HDL: 40 mg/dL — ABNORMAL LOW (ref >=50)
LDL Cholesterol (Calc): 75 mg/dL (calc)
Non-HDL Cholesterol (Calc): 104 mg/dL (calc) (ref <130)
Total CHOL/HDL Ratio: 3.6 (calc) (ref <5.0)
Triglycerides: 196 mg/dL — ABNORMAL HIGH (ref <150)

## 2023-02-14 LAB — HEMOGLOBIN A1C
Hgb A1c MFr Bld: 6.3 % of total Hgb — ABNORMAL HIGH (ref <5.7)
Mean Plasma Glucose: 134 mg/dL
eAG (mmol/L): 7.4 mmol/L

## 2023-02-14 LAB — CBC
HCT: 42.3 % (ref 35.0–45.0)
Hemoglobin: 14 g/dL (ref 11.7–15.5)
MCH: 30.9 pg (ref 27.0–33.0)
MCHC: 33.1 g/dL (ref 32.0–36.0)
MCV: 93.4 fL (ref 80.0–100.0)
MPV: 11.5 fL (ref 7.5–12.5)
Platelets: 291 10*3/uL (ref 140–400)
RBC: 4.53 10*6/uL (ref 3.80–5.10)
RDW: 13 % (ref 11.0–15.0)
WBC: 10.1 10*3/uL (ref 3.8–10.8)

## 2023-02-14 MED ORDER — ATORVASTATIN CALCIUM 20 MG PO TABS: 20.0000 mg | ORAL_TABLET | ORAL | 1 refills | Status: DC

## 2023-03-07 ENCOUNTER — Ambulatory Visit (INDEPENDENT_AMBULATORY_CARE_PROVIDER_SITE_OTHER): Payer: BC Managed Care – PPO | Admitting: Internal Medicine

## 2023-03-07 ENCOUNTER — Encounter: Payer: Self-pay | Admitting: Internal Medicine

## 2023-03-07 VITALS — BP 134/86 | HR 89 | Temp 96.5°F | Ht 65.0 in | Wt 293.0 lb

## 2023-03-07 DIAGNOSIS — Z23 Encounter for immunization: Secondary | ICD-10-CM | POA: Diagnosis not present

## 2023-03-07 DIAGNOSIS — Z6841 Body Mass Index (BMI) 40.0 and over, adult: Secondary | ICD-10-CM

## 2023-03-07 DIAGNOSIS — Z0001 Encounter for general adult medical examination with abnormal findings: Secondary | ICD-10-CM

## 2023-03-07 NOTE — Addendum Note (Signed)
Addended by: Kavin Leech E on: 03/07/2023 09:07 AM   Modules accepted: Orders

## 2023-03-07 NOTE — Patient Instructions (Signed)
Health Maintenance for Postmenopausal Women Menopause is a normal process in which your ability to get pregnant comes to an end. This process happens slowly over many months or years, usually between the ages of 48 and 55. Menopause is complete when you have missed your menstrual period for 12 months. It is important to talk with your health care provider about some of the most common conditions that affect women after menopause (postmenopausal women). These include heart disease, cancer, and bone loss (osteoporosis). Adopting a healthy lifestyle and getting preventive care can help to promote your health and wellness. The actions you take can also lower your chances of developing some of these common conditions. What are the signs and symptoms of menopause? During menopause, you may have the following symptoms: Hot flashes. These can be moderate or severe. Night sweats. Decrease in sex drive. Mood swings. Headaches. Tiredness (fatigue). Irritability. Memory problems. Problems falling asleep or staying asleep. Talk with your health care provider about treatment options for your symptoms. Do I need hormone replacement therapy? Hormone replacement therapy is effective in treating symptoms that are caused by menopause, such as hot flashes and night sweats. Hormone replacement carries certain risks, especially as you become older. If you are thinking about using estrogen or estrogen with progestin, discuss the benefits and risks with your health care provider. How can I reduce my risk for heart disease and stroke? The risk of heart disease, heart attack, and stroke increases as you age. One of the causes may be a change in the body's hormones during menopause. This can affect how your body uses dietary fats, triglycerides, and cholesterol. Heart attack and stroke are medical emergencies. There are many things that you can do to help prevent heart disease and stroke. Watch your blood pressure High  blood pressure causes heart disease and increases the risk of stroke. This is more likely to develop in people who have high blood pressure readings or are overweight. Have your blood pressure checked: Every 3-5 years if you are 18-39 years of age. Every year if you are 40 years old or older. Eat a healthy diet  Eat a diet that includes plenty of vegetables, fruits, low-fat dairy products, and lean protein. Do not eat a lot of foods that are high in solid fats, added sugars, or sodium. Get regular exercise Get regular exercise. This is one of the most important things you can do for your health. Most adults should: Try to exercise for at least 150 minutes each week. The exercise should increase your heart rate and make you sweat (moderate-intensity exercise). Try to do strengthening exercises at least twice each week. Do these in addition to the moderate-intensity exercise. Spend less time sitting. Even light physical activity can be beneficial. Other tips Work with your health care provider to achieve or maintain a healthy weight. Do not use any products that contain nicotine or tobacco. These products include cigarettes, chewing tobacco, and vaping devices, such as e-cigarettes. If you need help quitting, ask your health care provider. Know your numbers. Ask your health care provider to check your cholesterol and your blood sugar (glucose). Continue to have your blood tested as directed by your health care provider. Do I need screening for cancer? Depending on your health history and family history, you may need to have cancer screenings at different stages of your life. This may include screening for: Breast cancer. Cervical cancer. Lung cancer. Colorectal cancer. What is my risk for osteoporosis? After menopause, you may be   at increased risk for osteoporosis. Osteoporosis is a condition in which bone destruction happens more quickly than new bone creation. To help prevent osteoporosis or  the bone fractures that can happen because of osteoporosis, you may take the following actions: If you are 19-50 years old, get at least 1,000 mg of calcium and at least 600 international units (IU) of vitamin D per day. If you are older than age 50 but younger than age 70, get at least 1,200 mg of calcium and at least 600 international units (IU) of vitamin D per day. If you are older than age 70, get at least 1,200 mg of calcium and at least 800 international units (IU) of vitamin D per day. Smoking and drinking excessive alcohol increase the risk of osteoporosis. Eat foods that are rich in calcium and vitamin D, and do weight-bearing exercises several times each week as directed by your health care provider. How does menopause affect my mental health? Depression may occur at any age, but it is more common as you become older. Common symptoms of depression include: Feeling depressed. Changes in sleep patterns. Changes in appetite or eating patterns. Feeling an overall lack of motivation or enjoyment of activities that you previously enjoyed. Frequent crying spells. Talk with your health care provider if you think that you are experiencing any of these symptoms. General instructions See your health care provider for regular wellness exams and vaccines. This may include: Scheduling regular health, dental, and eye exams. Getting and maintaining your vaccines. These include: Influenza vaccine. Get this vaccine each year before the flu season begins. Pneumonia vaccine. Shingles vaccine. Tetanus, diphtheria, and pertussis (Tdap) booster vaccine. Your health care provider may also recommend other immunizations. Tell your health care provider if you have ever been abused or do not feel safe at home. Summary Menopause is a normal process in which your ability to get pregnant comes to an end. This condition causes hot flashes, night sweats, decreased interest in sex, mood swings, headaches, or lack  of sleep. Treatment for this condition may include hormone replacement therapy. Take actions to keep yourself healthy, including exercising regularly, eating a healthy diet, watching your weight, and checking your blood pressure and blood sugar levels. Get screened for cancer and depression. Make sure that you are up to date with all your vaccines. This information is not intended to replace advice given to you by your health care provider. Make sure you discuss any questions you have with your health care provider. Document Revised: 02/21/2021 Document Reviewed: 02/21/2021 Elsevier Patient Education  2023 Elsevier Inc.  

## 2023-03-07 NOTE — Progress Notes (Signed)
Subjective:    Patient ID: Jamie Rowland, female    DOB: 11-29-1960, 62 y.o.   MRN: [id number]  HPI  Patient presents to clinic today for her annual exam.  Flu: 07/2022 Tetanus: 07/2016 COVID: Pfizer x 2 Pneumovax: 07/2016 Shingrix: Never Pap smear: 02/2022 Mammogram: 08/2022 Bone density: 08/2022 Colon screening: 07/2014 Vision screening: annually Dentist: biannually  Diet: She does eat lean meat. She consumes fruits and veggies. She tries to avoid fried foods. She drinks mostly water. Exercise: None  Review of Systems     Past Medical History:  Diagnosis Date   Asthma    Bronchitis    Migraine headache    Seasonal allergies     Current Outpatient Medications  Medication Sig Dispense Refill   albuterol (VENTOLIN HFA) 108 (90 Base) MCG/ACT inhaler Inhale 2 puffs into the lungs every 6 (six) hours as needed for wheezing or shortness of breath. 8.5 g 2   atorvastatin (LIPITOR) 20 MG tablet Take 1 tablet (20 mg total) by mouth 3 (three) times a week. 45 tablet 1   diclofenac (VOLTAREN) 75 MG EC tablet Take 1 tablet (75 mg total) by mouth 2 (two) times daily. 180 tablet 1   montelukast (SINGULAIR) 10 MG tablet Take 1 tablet (10 mg total) by mouth at bedtime. 90 tablet 1   sertraline (ZOLOFT) 25 MG tablet Take 1 tablet (25 mg total) by mouth daily. 90 tablet 1   SUMAtriptan (IMITREX) 25 MG tablet May repeat in 2 hours if headache persists or recurs. 10 tablet 5   No current facility-administered medications for this visit.    Allergies  Allergen Reactions   Codeine Sulfate     REACTION: Hives, can't breathe   Latex Hives    Family History  Problem Relation Age of Onset   Hypertension Mother    Diverticulitis Mother    Fibromyalgia Mother    Heart failure Mother    Uterine cancer Sister        uterine   Asthma Maternal Grandmother    Heart disease Maternal Grandmother    Emphysema Maternal Grandmother        smoked   Diabetes Maternal Uncle    Heart  disease Maternal Uncle    Colon cancer Neg Hx    Esophageal cancer Neg Hx    Rectal cancer Neg Hx    Stomach cancer Neg Hx    Breast cancer Neg Hx     Social History   Socioeconomic History   Marital status: Married    Spouse name: Not on file   Number of children: 3   Years of education: Not on file   Highest education level: Not on file  Occupational History   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  Tobacco Use   Smoking status: Never   Smokeless tobacco: Never  Vaping Use   Vaping Use: Never used  Substance and Sexual Activity   Alcohol use: No   Drug use: No   Sexual activity: Yes  Other Topics Concern   Not on file  Social History Narrative   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  Denies fever, malaise, fatigue, or abrupt weight changes.  HEENT: Denies eye pain, eye redness, ear pain, ringing in the ears, wax buildup, runny nose, nasal congestion, bloody nose, or sore throat. Respiratory: Denies difficulty breathing, shortness of breath, cough or sputum production.   Cardiovascular: Denies chest pain, chest tightness, palpitations or swelling in the hands or feet.  Gastrointestinal: Denies abdominal pain, bloating, constipation, diarrhea or blood in the stool.  GU: Patient reports urinary urgency and frequency.  Denies pain with urination, burning sensation, blood in urine, odor or discharge. Musculoskeletal: Patient reports knee pain.  Denies decrease in range of motion, difficulty with gait, muscle pain or joint  swelling.  Skin: Denies redness, rashes, lesions or ulcercations.  Neurological: Denies dizziness, difficulty with memory, difficulty with speech or problems with balance and coordination.  Psych: Patient has a history of anxiety.  Denies depression, SI/HI.  No other specific complaints in a complete review of systems (except as listed in HPI above).  Objective:   Physical Exam  BP 134/86 (BP Location: Left Arm, Patient Position: Sitting, Cuff Size: Large)   Pulse 89   Temp (!) 96.5 F (35.8 C) (Temporal)   Ht 5\' 5"  (1.651 m)   Wt 293 lb (132.9 kg)   SpO2 97%   BMI 48.76 kg/m   Wt Readings from Last 3 Encounters:  02/13/23 295 lb (133.8 kg)  02/23/22 270 lb (122.5 kg)  10/05/21 276 lb (125.2 kg)    General: Appears her stated age, obese, in NAD. Skin: Warm, dry and intact.  HEENT: Head: normal shape and size; Eyes: sclera white, no icterus, conjunctiva pink, PERRLA and EOMs intact;  Neck:  Neck supple, trachea midline. No masses, lumps or thyromegaly present.  Cardiovascular: Normal rate and rhythm. S1,S2 noted.  No murmur, rubs or gallops noted. No JVD or BLE edema. No carotid bruits noted. Pulmonary/Chest: Normal effort and positive vesicular breath sounds. No respiratory distress. No wheezes, rales or ronchi noted.  Abdomen: Soft and nontender. Normal bowel sounds.  Musculoskeletal: Strength 5/5 BUE/BLE. Joint enlargement noted in knees. No difficulty with gait.  Neurological: Alert and oriented. Cranial nerves II-XII grossly intact. Coordination normal.  Psychiatric: Mood and affect normal. Behavior is normal. Judgment and thought content normal.    BMET    Component Value Date/Time   NA 142 02/13/2023 1332   K 4.3 02/13/2023 1332   CL 105 02/13/2023 1332   CO2 30 02/13/2023 1332   GLUCOSE 138 02/13/2023 1332   BUN 20 02/13/2023 1332   CREATININE 0.91 02/13/2023 1332   CALCIUM 9.5 02/13/2023 1332   GFRNONAA 81.49 04/08/2010 0831   GFRAA 77 05/16/2007 0947     Lipid Panel     Component Value Date/Time   CHOL 144 02/13/2023 1332   TRIG 196 (H) 02/13/2023 1332   HDL 40 (L) 02/13/2023 1332   CHOLHDL 3.6 02/13/2023 1332   VLDL 52.4 (H) 01/06/2021 1244   LDLCALC 75 02/13/2023 1332    CBC    Component Value Date/Time   WBC 10.1 02/13/2023 1332   RBC 4.53 02/13/2023 1332   HGB 14.0 02/13/2023 1332   HCT 42.3 02/13/2023 1332   PLT 291 02/13/2023 1332   MCV 93.4 02/13/2023 1332   MCH 30.9 02/13/2023 1332   MCHC 33.1 02/13/2023 1332   RDW 13.0 02/13/2023 1332   LYMPHSABS 3.3 07/04/2013 0827   MONOABS 1.0  07/04/2013 0827   EOSABS 0.5 07/04/2013 0827   BASOSABS 0.1 07/04/2013 0827    Hgb A1C Lab Results  Component Value Date   HGBA1C 6.3 (H) 02/13/2023           Assessment & Plan:   Preventative Health Maintenance:  Encouraged her to get a flu shot in the fall Tetanus UTD COVID-vaccine UTD Pneumovax today Discussed Shingrix vaccine, she will check coverage with her insurance company and schedule visit if she would like to have this done Pap smear UTD Mammogram and bone density UTD Colon screening UTD Encouraged her to consume a balanced diet and exercise regimen Advised her to see an eye doctor and dentist annually Recent labs reviewed  RTC in 6 months, follow-up chronic conditions Nicki Reaper, NP

## 2023-03-07 NOTE — Assessment & Plan Note (Signed)
Encouraged diet and exercise for weight loss ?

## 2023-05-25 ENCOUNTER — Encounter: Payer: Self-pay | Admitting: Internal Medicine

## 2023-05-25 DIAGNOSIS — M170 Bilateral primary osteoarthritis of knee: Secondary | ICD-10-CM | POA: Diagnosis not present

## 2023-06-15 ENCOUNTER — Ambulatory Visit: Payer: BC Managed Care – PPO | Admitting: Internal Medicine

## 2023-07-17 ENCOUNTER — Ambulatory Visit: Payer: BC Managed Care – PPO | Admitting: Internal Medicine

## 2023-07-26 ENCOUNTER — Ambulatory Visit: Payer: BC Managed Care – PPO | Admitting: Internal Medicine

## 2023-07-26 ENCOUNTER — Encounter: Payer: Self-pay | Admitting: Internal Medicine

## 2023-07-26 VITALS — BP 124/78 | HR 103 | Temp 95.5°F | Ht 65.0 in | Wt 303.0 lb

## 2023-07-26 DIAGNOSIS — Z0289 Encounter for other administrative examinations: Secondary | ICD-10-CM

## 2023-07-26 DIAGNOSIS — R7303 Prediabetes: Secondary | ICD-10-CM | POA: Diagnosis not present

## 2023-07-26 DIAGNOSIS — K219 Gastro-esophageal reflux disease without esophagitis: Secondary | ICD-10-CM

## 2023-07-26 DIAGNOSIS — E782 Mixed hyperlipidemia: Secondary | ICD-10-CM

## 2023-07-26 DIAGNOSIS — M170 Bilateral primary osteoarthritis of knee: Secondary | ICD-10-CM

## 2023-07-26 DIAGNOSIS — N3946 Mixed incontinence: Secondary | ICD-10-CM

## 2023-07-26 DIAGNOSIS — Z23 Encounter for immunization: Secondary | ICD-10-CM | POA: Diagnosis not present

## 2023-07-26 DIAGNOSIS — G43C1 Periodic headache syndromes in child or adult, intractable: Secondary | ICD-10-CM

## 2023-07-26 DIAGNOSIS — F411 Generalized anxiety disorder: Secondary | ICD-10-CM

## 2023-07-26 DIAGNOSIS — J4530 Mild persistent asthma, uncomplicated: Secondary | ICD-10-CM

## 2023-07-26 NOTE — Assessment & Plan Note (Signed)
Continue montelukast and albuterol as prescribed

## 2023-07-26 NOTE — Patient Instructions (Signed)
Calorie Counting for Weight Loss Calories are units of energy. Your body needs a certain number of calories from food to keep going throughout the day. When you eat or drink more calories than your body needs, your body stores the extra calories mostly as fat. When you eat or drink fewer calories than your body needs, your body burns fat to get the energy it needs. Calorie counting means keeping track of how many calories you eat and drink each day. Calorie counting can be helpful if you need to lose weight. If you eat fewer calories than your body needs, you should lose weight. Ask your health care provider what a healthy weight is for you. For calorie counting to work, you will need to eat the right number of calories each day to lose a healthy amount of weight per week. A dietitian can help you figure out how many calories you need in a day and will suggest ways to reach your calorie goal. A healthy amount of weight to lose each week is usually 1-2 lb (0.5-0.9 kg). This usually means that your daily calorie intake should be reduced by 500-750 calories. Eating 1,200-1,500 calories a day can help most women lose weight. Eating 1,500-1,800 calories a day can help most men lose weight. What do I need to know about calorie counting? Work with your health care provider or dietitian to determine how many calories you should get each day. To meet your daily calorie goal, you will need to: Find out how many calories are in each food that you would like to eat. Try to do this before you eat. Decide how much of the food you plan to eat. Keep a food log. Do this by writing down what you ate and how many calories it had. To successfully lose weight, it is important to balance calorie counting with a healthy lifestyle that includes regular activity. Where do I find calorie information?  The number of calories in a food can be found on a Nutrition Facts label. If a food does not have a Nutrition Facts label, try  to look up the calories online or ask your dietitian for help. Remember that calories are listed per serving. If you choose to have more than one serving of a food, you will have to multiply the calories per serving by the number of servings you plan to eat. For example, the label on a package of bread might say that a serving size is 1 slice and that there are 90 calories in a serving. If you eat 1 slice, you will have eaten 90 calories. If you eat 2 slices, you will have eaten 180 calories. How do I keep a food log? After each time that you eat, record the following in your food log as soon as possible: What you ate. Be sure to include toppings, sauces, and other extras on the food. How much you ate. This can be measured in cups, ounces, or number of items. How many calories were in each food and drink. The total number of calories in the food you ate. Keep your food log near you, such as in a pocket-sized notebook or on an app or website on your mobile phone. Some programs will calculate calories for you and show you how many calories you have left to meet your daily goal. What are some portion-control tips? Know how many calories are in a serving. This will help you know how many servings you can have of a certain   food. Use a measuring cup to measure serving sizes. You could also try weighing out portions on a kitchen scale. With time, you will be able to estimate serving sizes for some foods. Take time to put servings of different foods on your favorite plates or in your favorite bowls and cups so you know what a serving looks like. Try not to eat straight from a food's packaging, such as from a bag or box. Eating straight from the package makes it hard to see how much you are eating and can lead to overeating. Put the amount you would like to eat in a cup or on a plate to make sure you are eating the right portion. Use smaller plates, glasses, and bowls for smaller portions and to prevent  overeating. Try not to multitask. For example, avoid watching TV or using your computer while eating. If it is time to eat, sit down at a table and enjoy your food. This will help you recognize when you are full. It will also help you be more mindful of what and how much you are eating. What are tips for following this plan? Reading food labels Check the calorie count compared with the serving size. The serving size may be smaller than what you are used to eating. Check the source of the calories. Try to choose foods that are high in protein, fiber, and vitamins, and low in saturated fat, trans fat, and sodium. Shopping Read nutrition labels while you shop. This will help you make healthy decisions about which foods to buy. Pay attention to nutrition labels for low-fat or fat-free foods. These foods sometimes have the same number of calories or more calories than the full-fat versions. They also often have added sugar, starch, or salt to make up for flavor that was removed with the fat. Make a grocery list of lower-calorie foods and stick to it. Cooking Try to cook your favorite foods in a healthier way. For example, try baking instead of frying. Use low-fat dairy products. Meal planning Use more fruits and vegetables. One-half of your plate should be fruits and vegetables. Include lean proteins, such as chicken, turkey, and fish. Lifestyle Each week, aim to do one of the following: 150 minutes of moderate exercise, such as walking. 75 minutes of vigorous exercise, such as running. General information Know how many calories are in the foods you eat most often. This will help you calculate calorie counts faster. Find a way of tracking calories that works for you. Get creative. Try different apps or programs if writing down calories does not work for you. What foods should I eat?  Eat nutritious foods. It is better to have a nutritious, high-calorie food, such as an avocado, than a food with  few nutrients, such as a bag of potato chips. Use your calories on foods and drinks that will fill you up and will not leave you hungry soon after eating. Examples of foods that fill you up are nuts and nut butters, vegetables, lean proteins, and high-fiber foods such as whole grains. High-fiber foods are foods with more than 5 g of fiber per serving. Pay attention to calories in drinks. Low-calorie drinks include water and unsweetened drinks. The items listed above may not be a complete list of foods and beverages you can eat. Contact a dietitian for more information. What foods should I limit? Limit foods or drinks that are not good sources of vitamins, minerals, or protein or that are high in unhealthy fats. These   include: Candy. Other sweets. Sodas, specialty coffee drinks, alcohol, and juice. The items listed above may not be a complete list of foods and beverages you should avoid. Contact a dietitian for more information. How do I count calories when eating out? Pay attention to portions. Often, portions are much larger when eating out. Try these tips to keep portions smaller: Consider sharing a meal instead of getting your own. If you get your own meal, eat only half of it. Before you start eating, ask for a container and put half of your meal into it. When available, consider ordering smaller portions from the menu instead of full portions. Pay attention to your food and drink choices. Knowing the way food is cooked and what is included with the meal can help you eat fewer calories. If calories are listed on the menu, choose the lower-calorie options. Choose dishes that include vegetables, fruits, whole grains, low-fat dairy products, and lean proteins. Choose items that are boiled, broiled, grilled, or steamed. Avoid items that are buttered, battered, fried, or served with cream sauce. Items labeled as crispy are usually fried, unless stated otherwise. Choose water, low-fat milk,  unsweetened iced tea, or other drinks without added sugar. If you want an alcoholic beverage, choose a lower-calorie option, such as a glass of wine or light beer. Ask for dressings, sauces, and syrups on the side. These are usually high in calories, so you should limit the amount you eat. If you want a salad, choose a garden salad and ask for grilled meats. Avoid extra toppings such as bacon, cheese, or fried items. Ask for the dressing on the side, or ask for olive oil and vinegar or lemon to use as dressing. Estimate how many servings of a food you are given. Knowing serving sizes will help you be aware of how much food you are eating at restaurants. Where to find more information Centers for Disease Control and Prevention: www.cdc.gov U.S. Department of Agriculture: myplate.gov Summary Calorie counting means keeping track of how many calories you eat and drink each day. If you eat fewer calories than your body needs, you should lose weight. A healthy amount of weight to lose per week is usually 1-2 lb (0.5-0.9 kg). This usually means reducing your daily calorie intake by 500-750 calories. The number of calories in a food can be found on a Nutrition Facts label. If a food does not have a Nutrition Facts label, try to look up the calories online or ask your dietitian for help. Use smaller plates, glasses, and bowls for smaller portions and to prevent overeating. Use your calories on foods and drinks that will fill you up and not leave you hungry shortly after a meal. This information is not intended to replace advice given to you by your health care provider. Make sure you discuss any questions you have with your health care provider. Document Revised: 11/13/2019 Document Reviewed: 11/13/2019 Elsevier Patient Education  2023 Elsevier Inc.  

## 2023-07-26 NOTE — Assessment & Plan Note (Signed)
C-Met and lipid profile today Encouraged her to consume a low-fat diet 

## 2023-07-26 NOTE — Assessment & Plan Note (Signed)
Try to identify and avoid triggers  continue Imitrex as needed

## 2023-07-26 NOTE — Assessment & Plan Note (Signed)
Actively working on weight loss so that she is able to get a knee replacement Continue Celebrex per orthopedics

## 2023-07-26 NOTE — Assessment & Plan Note (Signed)
Stable on her current dose of sertraline Support offered

## 2023-07-26 NOTE — Assessment & Plan Note (Signed)
Avoid foods that trigger reflux Encourage weight loss as this can help reduce reflux symptoms Okay to take Tums OTC as needed

## 2023-07-26 NOTE — Assessment & Plan Note (Signed)
Will check A1c today She will check insurance coverage on [place], [place], [place], [place] She is currently consuming a keto diet, advised her to cut out fruit altogether Encouraged exercise in the form of water aerobics or stationary bike

## 2023-07-26 NOTE — Assessment & Plan Note (Signed)
Encourage Kegel exercises and timed voiding Not medicated

## 2023-07-26 NOTE — Assessment & Plan Note (Signed)
A1c today Encourage low-carb diet and exercise for weight loss

## 2023-07-26 NOTE — Progress Notes (Signed)
Subjective:    Patient ID: Jamie Rowland, female    DOB: Jun 29, 1961, 62 y.o.   MRN: [id number]  HPI  Patient presents to clinic today for follow-up of chronic conditions.  Asthma: Moderate, persistent, worse with the change in the weather. Managed on montelukast and albuterol as needed.  There are no PFTs on file.  She no longer follows with pulmonology.    Migraines: These occur 2 x month.  She is not sure what triggers them.  She takes imitrex as needed with good relief of symptoms.  She does not follow with neurology.  GERD: Triggered by spicy and greasy foods.  She has not had an issue with this lately and is not currently taking any medications for this. There is no upper GI on file.  OA: Mainly in her knees.  She takes celbrex as needed with good relief of symptoms. She plans to have a right knee replacement but she has to lose 50 lbs first. She follows with orthopedics.  OAB: She reports mainly urinary urgency.  She is not currently taking any medications for this.  She does not follow with urology.  Anxiety: Chronic, managed on sertraline.  She is not currently seeing a therapist.  She denies depression, SI/HI.  HLD: Her last LDL was 75, triglycerides 956, 02/2023.  She denies myalgias on atorvastatin.  She tries to consume a low-fat diet.  Prediabetes: Her last A1c was 6.3%, 02/2019.  She is not taking any oral diabetic medication at this time.  She does not check her sugars.  She is also interested in medication for weight loss.  Her weight today is 303 lbs with a BMI of 50.42. She has been trying a keto diet for the last 3 months. She has gained 7 lbs in that time. She is not counting calories, but she is trying to cut out carbohydrates. Breakfast: baked bacon, fried egg with avocado butter, yogurt. Lunch: salad. Dinner: Meat and green veggies or salad. Snacks: fruit (grapes or strawberries). She drinks mostly water. She has a stationary bike and she uses this daily for 30 minutes.  She reports about 4 years ago she was able to lose about 20 lb on the keto diet but was unable to keep this.   She would also like her handicap placard filled out today.  Review of Systems  Past Medical History:  Diagnosis Date   Asthma    Bronchitis    Migraine headache    Seasonal allergies     Current Outpatient Medications  Medication Sig Dispense Refill   albuterol (VENTOLIN HFA) 108 (90 Base) MCG/ACT inhaler Inhale 2 puffs into the lungs every 6 (six) hours as needed for wheezing or shortness of breath. 8.5 g 2   atorvastatin (LIPITOR) 20 MG tablet Take 1 tablet (20 mg total) by mouth 3 (three) times a week. 45 tablet 1   diclofenac (VOLTAREN) 75 MG EC tablet Take 1 tablet (75 mg total) by mouth 2 (two) times daily. 180 tablet 1   montelukast (SINGULAIR) 10 MG tablet Take 1 tablet (10 mg total) by mouth at bedtime. 90 tablet 1   sertraline (ZOLOFT) 25 MG tablet Take 1 tablet (25 mg total) by mouth daily. 90 tablet 1   SUMAtriptan (IMITREX) 25 MG tablet May repeat in 2 hours if headache persists or recurs. 10 tablet 5   No current facility-administered medications for this visit.    Allergies  Allergen Reactions   Codeine Sulfate     REACTION:  Hives, can't breathe   Latex Hives    Family History  Problem Relation Age of Onset   Hypertension Mother    Diverticulitis Mother    Fibromyalgia Mother    Heart failure Mother    Uterine cancer Sister    Asthma Maternal Grandmother    Heart disease Maternal Grandmother    Emphysema Maternal Grandmother        smoked   Diabetes Maternal Uncle    Heart disease Maternal Uncle    Colon cancer Neg Hx    Esophageal cancer Neg Hx    Rectal cancer Neg Hx    Stomach cancer Neg Hx    Breast cancer Neg Hx     Social History   Socioeconomic History   Marital status: Married    Spouse name: Not on file   Number of children: 3   Years of education: Not on file   Highest education level: Not on file  Occupational History    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  Tobacco Use   Smoking status: Never   Smokeless tobacco: Never  Vaping Use   Vaping status: Never Used  Substance and Sexual Activity   Alcohol use: No   Drug use: No   Sexual activity: Yes  Other Topics Concern   Not on file  Social History Narrative   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  Denies fever, malaise, fatigue.  HEENT: Denies eye pain, eye redness, ear pain, ringing in the ears, wax buildup, runny nose, nasal congestion, bloody nose, or sore throat. Respiratory: Denies difficulty breathing, shortness of breath, cough or sputum production.   Cardiovascular: Denies chest pain, chest tightness, palpitations or swelling in the hands or feet.  Gastrointestinal: Patient reports intermittent reflux.  Denies abdominal pain, bloating, constipation, diarrhea or blood in the stool.  GU: Patient reports urinary urgency.  Denies frequency, pain with urination, burning sensation, blood in urine, odor or discharge. Musculoskeletal: Patient reports knee pain.  Denies decrease in range of motion, difficulty with gait, muscle pain or joint swelling.  Skin: Denies redness, rashes, lesions or ulcercations.  Neurological: Denies dizziness, difficulty with memory, difficulty with speech or problems with balance and coordination.  Psych: Patient has a history of anxiety.  Denies depression, SI/HI.  No other specific complaints in a complete review of systems (except as listed  in HPI above).     Objective:   Physical Exam   BP 124/78 (BP Location: Left Wrist, Patient Position: Sitting, Cuff Size: Normal)   Pulse (!) 103   Temp (!) 95.5 F (35.3 C) (Temporal)   Ht 5\' 5"  (1.651 m)   Wt (!) 303 lb (137.4 kg)   SpO2 95%   BMI 50.42 kg/m   Wt Readings from Last 3 Encounters:  03/07/23 293 lb (132.9 kg)  02/13/23 295 lb (133.8 kg)  02/23/22 270 lb (122.5 kg)    General: Appears her stated age, obese, in NAD. Skin: Warm, dry and intact.  HEENT: Head: normal shape  and size; Eyes: sclera white, no icterus, conjunctiva pink, PERRLA and EOMs intact;  Cardiovascular: Tachycardic with normal rhythm. S1,S2 noted.  No murmur, rubs or gallops noted. No JVD.  Trace BLE edema. No carotid bruits noted. Pulmonary/Chest: Normal effort and positive vesicular breath sounds. No respiratory distress. No wheezes, rales or ronchi noted.  Abdomen: Soft and nontender. Normal bowel sounds.  Musculoskeletal: Gait slow and steady without device. Neurological: Alert and oriented. Coordination normal.  Psychiatric: Mood and affect normal. Behavior is normal. Judgment and thought content normal.    BMET    Component Value Date/Time   NA 142 02/13/2023 1332   K 4.3 02/13/2023 1332   CL 105 02/13/2023 1332   CO2 30 02/13/2023 1332   GLUCOSE 138 02/13/2023 1332   BUN 20 02/13/2023 1332   CREATININE 0.91 02/13/2023 1332   CALCIUM 9.5 02/13/2023 1332   GFRNONAA 81.49 04/08/2010 0831   GFRAA 77 05/16/2007 0947    Lipid Panel     Component Value Date/Time   CHOL 144 02/13/2023 1332   TRIG 196 (H) 02/13/2023 1332   HDL 40 (L) 02/13/2023 1332   CHOLHDL 3.6 02/13/2023 1332   VLDL 52.4 (H) 01/06/2021 1244   LDLCALC 75 02/13/2023 1332    CBC    Component Value Date/Time   WBC 10.1 02/13/2023 1332   RBC 4.53 02/13/2023 1332   HGB 14.0 02/13/2023 1332   HCT 42.3 02/13/2023 1332   PLT 291 02/13/2023 1332   MCV 93.4 02/13/2023 1332   MCH 30.9 02/13/2023 1332   MCHC 33.1  02/13/2023 1332   RDW 13.0 02/13/2023 1332   LYMPHSABS 3.3 07/04/2013 0827   MONOABS 1.0 07/04/2013 0827   EOSABS 0.5 07/04/2013 0827   BASOSABS 0.1 07/04/2013 0827    Hgb A1C Lab Results  Component Value Date   HGBA1C 6.3 (H) 02/13/2023           Assessment & Plan:       RTC in 7 months for annual exam Nicki Reaper, NP

## 2023-07-27 ENCOUNTER — Encounter: Payer: Self-pay | Admitting: Internal Medicine

## 2023-07-27 LAB — COMPLETE METABOLIC PANEL WITH GFR
AG Ratio: 1.3 (calc) (ref 1.0–2.5)
ALT: 16 U/L (ref 6–29)
AST: 11 U/L (ref 10–35)
Albumin: 4 g/dL (ref 3.6–5.1)
Alkaline phosphatase (APISO): 71 U/L (ref 37–153)
BUN: 19 mg/dL (ref 7–25)
CO2: 28 mmol/L (ref 20–32)
Calcium: 9.9 mg/dL (ref 8.6–10.4)
Chloride: 103 mmol/L (ref 98–110)
Creat: 0.82 mg/dL (ref 0.50–1.05)
Globulin: 3 g/dL (ref 1.9–3.7)
Glucose, Bld: 190 mg/dL — ABNORMAL HIGH (ref 65–99)
Potassium: 4.1 mmol/L (ref 3.5–5.3)
Sodium: 140 mmol/L (ref 135–146)
Total Bilirubin: 0.3 mg/dL (ref 0.2–1.2)
Total Protein: 7 g/dL (ref 6.1–8.1)
eGFR: 82 mL/min/{1.73_m2} (ref >=60)

## 2023-07-27 LAB — LIPID PANEL
Cholesterol: 158 mg/dL (ref <200)
HDL: 43 mg/dL — ABNORMAL LOW (ref >=50)
LDL Cholesterol (Calc): 87 mg/dL
Non-HDL Cholesterol (Calc): 115 mg/dL (ref <130)
Total CHOL/HDL Ratio: 3.7 (calc) (ref <5.0)
Triglycerides: 181 mg/dL — ABNORMAL HIGH (ref <150)

## 2023-07-27 LAB — HEMOGLOBIN A1C
Hgb A1c MFr Bld: 6.7 %{Hb} — ABNORMAL HIGH (ref <5.7)
Mean Plasma Glucose: 146 mg/dL
eAG (mmol/L): 8.1 mmol/L

## 2023-07-27 NOTE — Telephone Encounter (Signed)
Have her set up an appointment to discuss this.  Marcelline Deist is not used as monotherapy and diabetes.

## 2023-07-30 MED ORDER — METFORMIN HCL 500 MG PO TABS: 500.0000 mg | ORAL_TABLET | Freq: Every day | ORAL | 1 refills | Status: DC

## 2023-07-31 ENCOUNTER — Telehealth: Payer: Self-pay

## 2023-07-31 NOTE — Telephone Encounter (Signed)
-----   Message from [facility] sent at 07/27/2023  8:15 AM EDT ----- Please have her set up an appointment to discuss labs.  New onset diabetes.

## 2023-07-31 NOTE — Telephone Encounter (Signed)
Left message for patient to call and schedule an appointment to discuss lab results.

## 2023-08-02 NOTE — Telephone Encounter (Signed)
Do you still want patient to be seen next week or can she wait 3 months since she was prescribed metformin.

## 2023-08-03 NOTE — Telephone Encounter (Signed)
Fine to wait 3 months at this point

## 2023-08-08 ENCOUNTER — Ambulatory Visit: Payer: BC Managed Care – PPO | Admitting: Internal Medicine

## 2023-08-09 MED ORDER — TIRZEPATIDE 2.5 MG/0.5ML ~~LOC~~ SOAJ: 2.5000 mg | SUBCUTANEOUS | 0 refills | Status: DC

## 2023-08-09 NOTE — Addendum Note (Signed)
Addended by: Lorre Munroe on: 08/09/2023 01:11 PM   Modules accepted: Orders

## 2023-08-10 NOTE — Progress Notes (Signed)
(  Key: [id number])  form thumbnail [employer] [place] is processing your PA request and will respond shortly with next steps. You may close this dialog, return to your dashboard, and perform other tasks. To check for an update later, open this request again from your dashboard.  If you need assistance, please chat with CoverMyMeds or call us at [phone].

## 2023-08-13 NOTE — Progress Notes (Signed)
(  [employer]) PA Case ID #: [id number] Rx #: [id number] Need Help? Call us at [phone] Status sent iconSent to Plan today Drug Mounjaro 2.5MG /0.5ML auto-injectors ePA cloud logo Form [employer] Commercial Electronic Request Form Original Claim Info 75 The following information is provided by CoverMyMeds.  Patient Assistance Patient assistance and financial support may be available for eligible commercially insured patients through the manufacturer's patient savings program. For more information, and to see program requirements, follow the link below:  Patient Savings Program: click here.  Diagnosis  Mounjaro, an injectable prescription medicine, is indicated as an adjunct to diet and exercise to improve glycemic control in adults with type 2 diabetes mellitus.   Limitations of Use: Greggory Keen has not been studied in patients with a history of pancreatitis. Greggory Keen is not indicated for use in patients with type 1 diabetes mellitus.  WARNING: RISK OF THYROID C-CELL TUMORS  In both female and female rats, tirzepatide causes dose-dependent and treatment-duration-dependent thyroid C-cell tumors at clinically relevant exposures. It is unknown whether Mounjaro causes thyroid C-cell tumors, including medullary thyroid carcinoma (MTC), in humans as human relevance of tirzepatide-induced rodent thyroid C-cell tumors has not been determined.  Greggory Keen is contraindicated in patients with a personal or family history of MTC or in patients with Multiple Endocrine Neoplasia syndrome type 2 (MEN 2). Counsel patients regarding the potential risk for MTC with the use of Mounjaro and inform them of symptoms of thyroid tumors (e.g., a mass in the neck, dysphagia, dyspnea, persistent hoarseness). Routine monitoring of serum calcitonin or using thyroid ultrasound is of uncertain value for early detection of MTC in patients treated with Mounjaro.  Commonly used ICD-10 diagnosis code(s): E11.* (Type 2  diabetes mellitus)   * Indicates multiple available codes within a diagnosis category   These codes are presented for informational purposes only. They represent no statement, promise, or guarantee concerning coverage and/or levels of reimbursement, payment, or charge, and are not intended to increase or maximize reimbursement by any payer. It is the responsibility of the healthcare provider to determine appropriate code(s) for services provided to their patient.  Prescribing Information: click here. Important Safety Information: click here.  PP-TR-US-1223 04/2022 [employer] [place], [place] 4132. All rights reserved.

## 2023-08-14 NOTE — Progress Notes (Signed)
Mounjaro prior Jamie Rowland has been denied.  Ref #[id number].  The request does not meet the definitiono f medical necessity found in the members benefit booklet.

## 2023-08-15 NOTE — Progress Notes (Signed)
Do they not cover this for diabetes type 2?  This is a new diagnosis.  Do they prefer Ozempic?

## 2023-08-15 NOTE — Progress Notes (Signed)
(  Key: [id number])   Your appeal has been sent to [facility]. Records sent.

## 2023-08-23 NOTE — Progress Notes (Signed)
Jamie Rowland has been approved through 08/14/2024

## 2023-09-05 ENCOUNTER — Other Ambulatory Visit: Payer: Self-pay | Admitting: Internal Medicine

## 2023-09-05 DIAGNOSIS — Z0001 Encounter for general adult medical examination with abnormal findings: Secondary | ICD-10-CM

## 2023-09-06 MED ORDER — DICLOFENAC SODIUM 75 MG PO TBEC: 75.0000 mg | DELAYED_RELEASE_TABLET | Freq: Two times a day (BID) | ORAL | 0 refills | Status: DC

## 2023-09-06 NOTE — Addendum Note (Signed)
Addended by: Lorre Munroe on: 09/06/2023 09:17 AM   Modules accepted: Orders

## 2023-09-06 NOTE — Telephone Encounter (Signed)
Requested medication (s) are due for refill today: No  Requested medication (s) are on the active medication list: Yes  Last refill:  09/06/23  Future visit scheduled:   Notes to clinic:  Manual review.    Requested Prescriptions  Pending Prescriptions Disp Refills   diclofenac (VOLTAREN) 75 MG EC tablet [Pharmacy Med Name: DICLOFENAC SOD DR 75 MG TAB 75 Tablet] 180 tablet 1    Sig: TAKE 1 TABLET BY MOUTH 2 TIMES DAILY.     Analgesics:  NSAIDS Failed - 09/05/2023 11:26 AM      Failed - Manual Review: Labs are only required if the patient has taken medication for more than 8 weeks.      Passed - Cr in normal range and within 360 days    Creat  Date Value Ref Range Status  07/26/2023 0.82 0.50 - 1.05 mg/dL Final         Passed - HGB in normal range and within 360 days    Hemoglobin  Date Value Ref Range Status  02/13/2023 14.0 11.7 - 15.5 g/dL Final         Passed - PLT in normal range and within 360 days    Platelets  Date Value Ref Range Status  02/13/2023 291 140 - 400 Thousand/uL Final         Passed - HCT in normal range and within 360 days    HCT  Date Value Ref Range Status  02/13/2023 42.3 35.0 - 45.0 % Final         Passed - eGFR is 30 or above and within 360 days    GFR calc Af Amer  Date Value Ref Range Status  05/16/2007 77 mL/min Final   GFR calc non Af Amer  Date Value Ref Range Status  04/08/2010 81.49 >60 mL/min Final   GFR  Date Value Ref Range Status  01/06/2021 67.81 >60.00 mL/min Final    Comment:    Calculated using the CKD-EPI Creatinine Equation (2021)   eGFR  Date Value Ref Range Status  07/26/2023 82 > OR = 60 mL/min/1.16m2 Final         Passed - Patient is not pregnant      Passed - Valid encounter within last 12 months    Recent Outpatient Visits           1 month ago Prediabetes   [place] [facility] [place], Salvadore Oxford, NP   6 months ago Encounter for general adult medical examination with abnormal  findings   [facility] [place], [place] W, NP   6 months ago Mild persistent chronic asthma without complication   [place] [facility] [place], Salvadore Oxford, NP   1 year ago Encounter for general adult medical examination with abnormal findings   [place] [facility] [place], Salvadore Oxford, NP   1 year ago Neck pain   [place] [place], Salvadore Oxford, NP       Future Appointments             In 6 months Baity, Salvadore Oxford, NP [place] [facility]            Signed Prescriptions Disp Refills   sertraline (ZOLOFT) 25 MG tablet 90 tablet 1    Sig: TAKE 1 TABLET (25 MG TOTAL) BY MOUTH DAILY.     Psychiatry:  Antidepressants - SSRI - sertraline Passed -  09/05/2023 11:26 AM      Passed - AST in normal range and within 360 days    AST  Date Value Ref Range Status  07/26/2023 11 10 - 35 U/L Final         Passed - ALT in normal range and within 360 days    ALT  Date Value Ref Range Status  07/26/2023 16 6 - 29 U/L Final         Passed - Completed PHQ-2 or PHQ-9 in the last 360 days      Passed - Valid encounter within last 6 months    Recent Outpatient Visits           1 month ago Prediabetes   [place] [place], Salvadore Oxford, NP   6 months ago Encounter for general adult medical examination with abnormal findings   [place] [facility] [place], [place] W, NP   6 months ago Mild persistent chronic asthma without complication   [place] [facility] [place], Salvadore Oxford, NP   1 year ago Encounter for general adult medical examination with abnormal findings   [place] [facility] [place], Salvadore Oxford, NP   1 year ago Neck pain   [place] [facility] [place], Salvadore Oxford, NP       Future Appointments             In 6 months Baity, Salvadore Oxford, NP [place] [facility]              atorvastatin (LIPITOR) 20 MG tablet 45 tablet 1    Sig: TAKE 1 TABLET BY MOUTH 3 TIMES A WEEK.     Cardiovascular:  Antilipid - Statins Failed - 09/05/2023 11:26 AM      Failed - Lipid Panel in normal range within the last 12 months    Cholesterol  Date Value Ref Range Status  07/26/2023 158 <200 mg/dL Final   LDL Cholesterol (Calc)  Date Value Ref Range Status  07/26/2023 87 mg/dL (calc) Final    Comment:    Reference range: <100 . Desirable range <100 mg/dL for primary prevention;   <70 mg/dL for patients with CHD or diabetic patients  with > or = 2 CHD risk factors. Marland Kitchen LDL-C is now calculated using the Martin-Hopkins  calculation, which is a validated novel method providing  better accuracy than the Friedewald equation in the  estimation of LDL-C.  Horald Pollen et al. Lenox Ahr. 1610;960(45): 2061-2068  (http://education.[facility].com/faq/FAQ164)    Direct LDL  Date Value Ref Range Status  01/06/2021 132.0 mg/dL Final    Comment:    Optimal:  <100 mg/dLNear or Above Optimal:  100-129 mg/dLBorderline High:  130-159 mg/dLHigh:  160-189 mg/dLVery High:  >190 mg/dL   HDL  Date Value Ref Range Status  07/26/2023 43 (L) > OR = 50 mg/dL Final   Triglycerides  Date Value Ref Range Status  07/26/2023 181 (H) <150 mg/dL Final         Passed - Patient is not pregnant      Passed - Valid encounter within last 12 months    Recent Outpatient Visits           1 month ago Prediabetes   [place] [facility] [place], Salvadore Oxford, NP   6 months ago Encounter for general adult medical examination with abnormal findings   [facility]  [place], NP   6 months ago Mild persistent chronic asthma without complication   [place] [facility] [place], Salvadore Oxford, NP   1 year ago Encounter for general adult medical examination with abnormal findings   [place] [facility] [place], Salvadore Oxford, NP    1 year ago Neck pain   [place] [facility] [place], Salvadore Oxford, NP       Future Appointments             In 6 months Baity, Salvadore Oxford, NP [place] [facility], [facility]

## 2023-09-06 NOTE — Telephone Encounter (Signed)
Requested Prescriptions  Pending Prescriptions Disp Refills   sertraline (ZOLOFT) 25 MG tablet [Pharmacy Med Name: SERTRALINE HCL 25 MG TABS 25 Tablet] 90 tablet 1    Sig: TAKE 1 TABLET (25 MG TOTAL) BY MOUTH DAILY.     Psychiatry:  Antidepressants - SSRI - sertraline Passed - 09/05/2023 11:26 AM      Passed - AST in normal range and within 360 days    AST  Date Value Ref Range Status  07/26/2023 11 10 - 35 U/L Final         Passed - ALT in normal range and within 360 days    ALT  Date Value Ref Range Status  07/26/2023 16 6 - 29 U/L Final         Passed - Completed PHQ-2 or PHQ-9 in the last 360 days      Passed - Valid encounter within last 6 months    Recent Outpatient Visits           1 month ago Prediabetes   [facility] [facility] [place], Salvadore Oxford, NP   6 months ago Encounter for general adult medical examination with abnormal findings   [place] [facility] [place], [place] W, NP   6 months ago Mild persistent chronic asthma without complication   [place] [facility] [place], Salvadore Oxford, NP   1 year ago Encounter for general adult medical examination with abnormal findings   [place] [facility] [place], Salvadore Oxford, NP   1 year ago Neck pain   [facility] [place], Salvadore Oxford, NP       Future Appointments             In 6 months Baity, Salvadore Oxford, NP  [facility], [facility]             diclofenac (VOLTAREN) 75 MG EC tablet [Pharmacy Med Name: DICLOFENAC SOD DR 75 MG TAB 75 Tablet] 180 tablet 1    Sig: TAKE 1 TABLET BY MOUTH 2 TIMES DAILY.     Analgesics:  NSAIDS Failed - 09/05/2023 11:26 AM      Failed - Manual Review: Labs are only required if the patient has taken medication for more than 8 weeks.      Passed - Cr in normal range and within 360 days    Creat  Date Value Ref Range Status  07/26/2023 0.82 0.50 - 1.05 mg/dL Final         Passed -  HGB in normal range and within 360 days    Hemoglobin  Date Value Ref Range Status  02/13/2023 14.0 11.7 - 15.5 g/dL Final         Passed - PLT in normal range and within 360 days    Platelets  Date Value Ref Range Status  02/13/2023 291 140 - 400 Thousand/uL Final         Passed - HCT in normal range and within 360 days    HCT  Date Value Ref Range Status  02/13/2023 42.3 35.0 - 45.0 % Final         Passed - eGFR is 30 or above and within 360 days    GFR calc Af Amer  Date Value Ref Range Status  05/16/2007 77 mL/min Final   GFR calc non Af Amer  Date Value Ref Range Status  04/08/2010 81.49 >60 mL/min Final   GFR  Date  Value Ref Range Status  01/06/2021 67.81 >60.00 mL/min Final    Comment:    Calculated using the CKD-EPI Creatinine Equation (2021)   eGFR  Date Value Ref Range Status  07/26/2023 82 > OR = 60 mL/min/1.33m2 Final         Passed - Patient is not pregnant      Passed - Valid encounter within last 12 months    Recent Outpatient Visits           1 month ago Prediabetes   [place] [facility] [place], Salvadore Oxford, NP   6 months ago Encounter for general adult medical examination with abnormal findings   [place] [facility] [place], [place] W, NP   6 months ago Mild persistent chronic asthma without complication   [place] [place], Salvadore Oxford, NP   1 year ago Encounter for general adult medical examination with abnormal findings   [place] [facility] [place], Salvadore Oxford, NP   1 year ago Neck pain   [place] [facility] [place], Salvadore Oxford, NP       Future Appointments             In 6 months Baity, Salvadore Oxford, NP [facility]             atorvastatin (LIPITOR) 20 MG tablet [Pharmacy Med Name: ATORVASTATIN 20 MG TAB 20 Tablet] 45 tablet 1    Sig: TAKE 1 TABLET BY MOUTH 3 TIMES A WEEK.     Cardiovascular:  Antilipid -  Statins Failed - 09/05/2023 11:26 AM      Failed - Lipid Panel in normal range within the last 12 months    Cholesterol  Date Value Ref Range Status  07/26/2023 158 <200 mg/dL Final   LDL Cholesterol (Calc)  Date Value Ref Range Status  07/26/2023 87 mg/dL (calc) Final    Comment:    Reference range: <100 . Desirable range <100 mg/dL for primary prevention;   <70 mg/dL for patients with CHD or diabetic patients  with > or = 2 CHD risk factors. Marland Kitchen LDL-C is now calculated using the Martin-Hopkins  calculation, which is a validated novel method providing  better accuracy than the Friedewald equation in the  estimation of LDL-C.  Horald Pollen et al. Lenox Ahr. 4098;119(14): 2061-2068  (http://education.[facility].com/faq/FAQ164)    Direct LDL  Date Value Ref Range Status  01/06/2021 132.0 mg/dL Final    Comment:    Optimal:  <100 mg/dLNear or Above Optimal:  100-129 mg/dLBorderline High:  130-159 mg/dLHigh:  160-189 mg/dLVery High:  >190 mg/dL   HDL  Date Value Ref Range Status  07/26/2023 43 (L) > OR = 50 mg/dL Final   Triglycerides  Date Value Ref Range Status  07/26/2023 181 (H) <150 mg/dL Final         Passed - Patient is not pregnant      Passed - Valid encounter within last 12 months    Recent Outpatient Visits           1 month ago Prediabetes    [facility] [place], Salvadore Oxford, NP   6 months ago Encounter for general adult medical examination with abnormal findings    [facility] [place], [place] W, NP   6 months ago Mild persistent chronic asthma without complication   [facility] [place], [place],  NP   1 year ago Encounter for general adult medical examination with abnormal findings   [place] [facility] [place], Salvadore Oxford, NP   1 year ago Neck pain   [place] [facility] [place], Salvadore Oxford, NP       Future Appointments             In 6 months  Baity, Salvadore Oxford, NP [place] [facility]

## 2023-09-07 ENCOUNTER — Ambulatory Visit: Payer: BC Managed Care – PPO | Admitting: Internal Medicine

## 2023-09-11 ENCOUNTER — Other Ambulatory Visit: Payer: Self-pay | Admitting: Internal Medicine

## 2023-09-12 ENCOUNTER — Other Ambulatory Visit: Payer: Self-pay | Admitting: Internal Medicine

## 2023-09-12 NOTE — Telephone Encounter (Signed)
Requested Prescriptions  Pending Prescriptions Disp Refills   albuterol (VENTOLIN HFA) 108 (90 Base) MCG/ACT inhaler [Pharmacy Med Name: ALBUTEROL SULF HFA 90 MCG 108 (90 BAS Aerosol] 8.5 g 2    Sig: INHALE 2 PUFFS INTO THE LUNGS EVERY 6 HOURS AS NEEDED FOR WHEEZING OR SHORTNESS OF BREATH.     Pulmonology:  Beta Agonists 2 Passed - 09/11/2023 11:08 AM      Passed - Last BP in normal range    BP Readings from Last 1 Encounters:  07/26/23 124/78         Passed - Last Heart Rate in normal range    Pulse Readings from Last 1 Encounters:  07/26/23 (!) 103         Passed - Valid encounter within last 12 months    Recent Outpatient Visits           1 month ago Prediabetes   [place] [facility] [place], Salvadore Oxford, NP   6 months ago Encounter for general adult medical examination with abnormal findings   [place] [facility] [place], [place], NP   7 months ago Mild persistent chronic asthma without complication   [place] [facility] [place], Salvadore Oxford, NP   1 year ago Encounter for general adult medical examination with abnormal findings   [place] [place], Salvadore Oxford, NP   1 year ago Neck pain   [place] [facility] [place], Salvadore Oxford, NP       Future Appointments             In 5 months Baity, Salvadore Oxford, NP  [facility], [facility]

## 2023-09-12 NOTE — Telephone Encounter (Signed)
Requested Prescriptions  Pending Prescriptions Disp Refills   MOUNJARO 2.5 MG/0.5ML Pen [Pharmacy Med Name: MOUNJARO 2.5 MG/0.5ML SOPN 2.5 Solution Auto-injector] 2 mL 0    Sig: INJECT 2.5 MG INTO THE SKIN ONCE A WEEK.     Off-Protocol Failed - 09/12/2023  5:30 PM      Failed - Medication not assigned to a protocol, review manually.      Passed - Valid encounter within last 12 months    Recent Outpatient Visits           1 month ago Prediabetes   [facility] [place], Salvadore Oxford, NP   6 months ago Encounter for general adult medical examination with abnormal findings   [place] [facility] [place], [place], NP   7 months ago Mild persistent chronic asthma without complication   [place] [facility] [place], Salvadore Oxford, NP   1 year ago Encounter for general adult medical examination with abnormal findings   [place] [facility], Salvadore Oxford, NP   1 year ago Neck pain    [facility] [place], Salvadore Oxford, NP       Future Appointments             In 5 months Baity, Salvadore Oxford, NP  [facility], [facility]

## 2023-09-18 ENCOUNTER — Telehealth: Payer: Self-pay

## 2023-09-18 NOTE — Telephone Encounter (Signed)
Jamie Rowland (Key: [id number])  Mounjaro 2.5MG /0.5ML  Your information has been sent to [employer] [place].

## 2023-09-18 NOTE — Telephone Encounter (Signed)
Authorization On File.Authorization On File.Please note that an authorization is already on file for the requested medication and is effective through (08/14/2024). If you would like another copy of the approval letter, please call [phone] option 3, 3. Thank you.

## 2023-09-20 NOTE — Telephone Encounter (Signed)
Pt called in states spoke with her insurance and received, rejection of PA for monjauro. She states they are saying it says once a month and not once a week. I let her know of the info of PA being approved already until Oct 2025, but she spoke with insurance today, and is still be rejected.Please cb for further discussion

## 2023-09-26 NOTE — Telephone Encounter (Signed)
Mounjaro 2.5mg /0.5ML has been approved with [employer] from 08/15/23-08/14/24

## 2023-10-01 ENCOUNTER — Other Ambulatory Visit: Payer: Self-pay | Admitting: Internal Medicine

## 2023-10-01 DIAGNOSIS — Z0001 Encounter for general adult medical examination with abnormal findings: Secondary | ICD-10-CM

## 2023-10-02 NOTE — Telephone Encounter (Signed)
Requested Prescriptions  Pending Prescriptions Disp Refills   SUMAtriptan (IMITREX) 25 MG tablet [Pharmacy Med Name: SUMATRIPTAN SUCCINATE 25 MG 25 Tablet] 10 tablet 5    Sig: TAKE 1 TABLET BY MOUTH AT ONSET, MAY REPEAT IN 2 HOURS IF HEADACHE PERSISTS OR RECURS.     Neurology:  Migraine Therapy - Triptan Passed - 10/02/2023 12:10 PM      Passed - Last BP in normal range    BP Readings from Last 1 Encounters:  07/26/23 124/78         Passed - Valid encounter within last 12 months    Recent Outpatient Visits           2 months ago Prediabetes   [place] [facility] [place], Salvadore Oxford, NP   6 months ago Encounter for general adult medical examination with abnormal findings   [facility] [place], [place], NP   7 months ago Mild persistent chronic asthma without complication   [place] [facility] [place], Salvadore Oxford, NP   1 year ago Encounter for general adult medical examination with abnormal findings   [place] [facility] [place], Salvadore Oxford, NP   1 year ago Neck pain   [place] [facility] [place], Salvadore Oxford, NP       Future Appointments             In 5 months Baity, Salvadore Oxford, NP  [facility], [facility]

## 2023-10-03 ENCOUNTER — Telehealth: Payer: Self-pay | Admitting: Internal Medicine

## 2023-10-03 NOTE — Telephone Encounter (Signed)
Pt states the insurance company will not refill her  tirzepatide [facility]) 2.5 MG/0.5ML Pen b/c they have the Rx as 180 days to refill, which is 1 pen/mo for 4 months.  This is the Rx [employer] company has. The pharmacy has this info.  No one ever got the revision. Pt states Amber told it was approved. Please call insurance company.  Please call [phone]  Ref no: [id number]

## 2023-10-03 NOTE — Telephone Encounter (Signed)
Should patient increase mounjaro to 5mg ?  Insurance has put a hold on 2.5.  If she is to continue on 2.5 then a quantity limit form needs to be processed.

## 2023-10-04 MED ORDER — TIRZEPATIDE 5 MG/0.5ML ~~LOC~~ SOAJ: 5.0000 mg | SUBCUTANEOUS | 0 refills | Status: DC

## 2023-10-04 NOTE — Telephone Encounter (Signed)
Updated rx for mounjaro sent to pharmacy.

## 2023-10-04 NOTE — Telephone Encounter (Signed)
She should increase to 5 mg weekly as long as she is not having significant side effects

## 2023-10-04 NOTE — Addendum Note (Signed)
Addended by: Judd Gaudier on: 10/04/2023 09:07 AM   Modules accepted: Orders

## 2023-12-10 ENCOUNTER — Other Ambulatory Visit: Payer: Self-pay | Admitting: Internal Medicine

## 2023-12-11 NOTE — Telephone Encounter (Unsigned)
 Copied from CRM [id number]. Topic: Clinical - Prescription Issue >> Dec 11, 2023 11:27 AM Gery Pray wrote: Reason for CRM: Prescription was called in by [facility] for patient's pain medication of diclofenac (VOLTAREN) 75 MG EC tablet. Patient would like a confirmation call once this prescription has been sent in tot the pharmacy. Patient's callback [phone]

## 2023-12-11 NOTE — Telephone Encounter (Signed)
 Requested medication (s) are due for refill today: yes  Requested medication (s) are on the active medication list: yes  Last refill:  09/06/23 #180 0 refills   Future visit scheduled: yes in 2 months  Notes to clinic:  no refills remain. Do you want to refill Rx? Patient requesting call when Rx sent to pharmacy      Requested Prescriptions  Pending Prescriptions Disp Refills   diclofenac (VOLTAREN) 75 MG EC tablet [Pharmacy Med Name: DICLOFENAC SODIUM 75 MG TAB 75 Tablet] 180 tablet 1    Sig: TAKE 1 TABLET BY MOUTH 2 TIMES DAILY.     Analgesics:  NSAIDS Failed - 12/11/2023 12:32 PM      Failed - Manual Review: Labs are only required if the patient has taken medication for more than 8 weeks.      Passed - Cr in normal range and within 360 days    Creat  Date Value Ref Range Status  07/26/2023 0.82 0.50 - 1.05 mg/dL Final         Passed - HGB in normal range and within 360 days    Hemoglobin  Date Value Ref Range Status  02/13/2023 14.0 11.7 - 15.5 g/dL Final         Passed - PLT in normal range and within 360 days    Platelets  Date Value Ref Range Status  02/13/2023 291 140 - 400 Thousand/uL Final         Passed - HCT in normal range and within 360 days    HCT  Date Value Ref Range Status  02/13/2023 42.3 35.0 - 45.0 % Final         Passed - eGFR is 30 or above and within 360 days    GFR calc Af Amer  Date Value Ref Range Status  05/16/2007 77 mL/min Final   GFR calc non Af Amer  Date Value Ref Range Status  04/08/2010 81.49 >60 mL/min Final   GFR  Date Value Ref Range Status  01/06/2021 67.81 >60.00 mL/min Final    Comment:    Calculated using the CKD-EPI Creatinine Equation (2021)   eGFR  Date Value Ref Range Status  07/26/2023 82 > OR = 60 mL/min/1.16m2 Final         Passed - Patient is not pregnant      Passed - Valid encounter within last 12 months    Recent Outpatient Visits           4 months ago Prediabetes   [facility] [place], Salvadore Oxford, NP   9 months ago Encounter for general adult medical examination with abnormal findings   [place] [facility] [place], [place], NP   10 months ago Mild persistent chronic asthma without complication   [place] [facility] [place], Salvadore Oxford, NP   1 year ago Encounter for general adult medical examination with abnormal findings   [place] [facility] [place], Salvadore Oxford, NP   2 years ago Neck pain   [place] [facility] [place], Salvadore Oxford, NP       Future Appointments             In 2 months Baity, Salvadore Oxford, NP [place], [facility]

## 2023-12-12 ENCOUNTER — Telehealth: Payer: Self-pay

## 2023-12-12 MED ORDER — DICLOFENAC SODIUM 75 MG PO TBEC: 75.0000 mg | DELAYED_RELEASE_TABLET | Freq: Two times a day (BID) | ORAL | 0 refills | Status: DC

## 2023-12-12 NOTE — Telephone Encounter (Signed)
 Copied from CRM [id number]. Topic: Clinical - Prescription Issue >> Dec 11, 2023 11:27 AM Gery Pray wrote: Reason for CRM: Prescription was called in by [facility] for patient's pain medication of diclofenac (VOLTAREN) 75 MG EC tablet. Patient would like a confirmation call once this prescription has been sent in tot the pharmacy. Patient's callback [phone]

## 2023-12-12 NOTE — Telephone Encounter (Signed)
 Rx has been sent to pharmacy and patient was notified.

## 2023-12-27 ENCOUNTER — Other Ambulatory Visit: Payer: Self-pay | Admitting: Internal Medicine

## 2023-12-27 DIAGNOSIS — Z0001 Encounter for general adult medical examination with abnormal findings: Secondary | ICD-10-CM

## 2023-12-28 NOTE — Telephone Encounter (Signed)
 Requested Prescriptions  Pending Prescriptions Disp Refills   montelukast (SINGULAIR) 10 MG tablet [Pharmacy Med Name: MONTELUKAST SODIUM 10 MG TA 10 Tablet] 90 tablet 1    Sig: TAKE 1 TABLET (10 MG TOTAL) BY MOUTH AT BEDTIME.     Pulmonology:  Leukotriene Inhibitors Passed - 12/28/2023 11:17 AM      Passed - Valid encounter within last 12 months    Recent Outpatient Visits           5 months ago Prediabetes   [place] [facility] [place], Salvadore Oxford, NP   9 months ago Encounter for general adult medical examination with abnormal findings   [place] [facility] [place], [place], NP   10 months ago Mild persistent chronic asthma without complication   [place] [facility] [place], Salvadore Oxford, NP   1 year ago Encounter for general adult medical examination with abnormal findings   [place] [facility] [place], Salvadore Oxford, NP   2 years ago Neck pain   [place] [facility] [place], Salvadore [place], [place]

## 2024-01-01 ENCOUNTER — Other Ambulatory Visit: Payer: Self-pay | Admitting: Internal Medicine

## 2024-01-01 NOTE — Telephone Encounter (Signed)
 Copied from CRM [id number]. Topic: Clinical - Medication Refill >> Jan 01, 2024 12:53 PM Gaetano Hawthorne wrote: Most Recent Primary Care Visit:  Provider: Lorre Munroe  Department: [id number]  Visit Type: OFFICE VISIT  Date: 07/26/2023  Medication: tirzepatide [facility]) 5 MG/0.5ML Pen  Has the patient contacted their pharmacy? No, this medication needs to go to a different pharmacy. (Agent: If no, request that the patient contact the pharmacy for the refill. If patient does not wish to contact the pharmacy document the reason why and proceed with request.) (Agent: If yes, when and what did the pharmacy advise?)  Is this the correct pharmacy for this prescription? No If no, delete pharmacy and type the correct one.   [facility] [address] ([address] [place] - [address] [place] ([place]) [place] [address] Phone: [phone] Fax: [phone]   Has the prescription been filled recently? No  Is the patient out of the medication? Yes  Has the patient been seen for an appointment in the last year OR does the patient have an upcoming appointment? Yes  Can we respond through MyChart? Yes  Agent: Please be advised that Rx refills may take up to 3 business days. We ask that you follow-up with your pharmacy.

## 2024-01-02 NOTE — Telephone Encounter (Signed)
 Requested medication (s) are due for refill today: yes  Requested medication (s) are on the active medication list: yes  Last refill:  10/04/23  Future visit scheduled: Yes  Notes to clinic:  Medication not assigned to a protocol, review manually.      Requested Prescriptions  Pending Prescriptions Disp Refills   MOUNJARO 5 MG/0.5ML Pen [Pharmacy Med Name: Mounjaro 5 MG/0.5ML Subcutaneous Solution Pen-injector] 12 mL 0    Sig: INJECT 5 MG  SUBCUTANEOUSLY ONCE A WEEK     Off-Protocol Failed - 01/02/2024  4:23 PM      Failed - Medication not assigned to a protocol, review manually.      Passed - Valid encounter within last 12 months    Recent Outpatient Visits           5 months ago Prediabetes   [place] [facility] [place], Salvadore [place], NP   10 months ago Encounter for general adult medical examination with abnormal findings   [place] [facility] [place], [place], NP   10 months ago Mild persistent chronic asthma without complication   [place] [facility] [place], Salvadore [place], NP   1 year ago Encounter for general adult medical examination with abnormal findings   [place] [facility] [place], Salvadore [place], NP   2 years ago Neck pain   [place] [facility] [place], Salvadore [place], [place]

## 2024-01-02 NOTE — Telephone Encounter (Signed)
 Requested medication (s) are due for refill today: Yes  Requested medication (s) are on the active medication list: Yes  Last refill:  10/04/23  Future visit scheduled: No  Notes to clinic:  Unable to refill per protocol, cannot delegate.      Requested Prescriptions  Pending Prescriptions Disp Refills   tirzepatide (MOUNJARO) 5 MG/0.5ML Pen 6 mL 0    Sig: Inject 5 mg into the skin once a week.     Off-Protocol Failed - 01/02/2024  4:29 PM      Failed - Medication not assigned to a protocol, review manually.      Passed - Valid encounter within last 12 months    Recent Outpatient Visits           5 months ago Prediabetes   [place] [facility] [place], Salvadore [place], NP   10 months ago Encounter for general adult medical examination with abnormal findings   [place] [facility] [place], [place], NP   10 months ago Mild persistent chronic asthma without complication   [place] [facility] [place], Salvadore [place], NP   1 year ago Encounter for general adult medical examination with abnormal findings   [place] [facility] [place], Salvadore [place], NP   2 years ago Neck pain    [place] [facility] [place], Salvad[place], [place]

## 2024-01-03 MED ORDER — TIRZEPATIDE 7.5 MG/0.5ML ~~LOC~~ SOAJ: 7.5000 mg | SUBCUTANEOUS | 0 refills | Status: DC

## 2024-01-23 DIAGNOSIS — M170 Bilateral primary osteoarthritis of knee: Secondary | ICD-10-CM | POA: Diagnosis not present

## 2024-01-23 DIAGNOSIS — M25561 Pain in right knee: Secondary | ICD-10-CM | POA: Diagnosis not present

## 2024-01-23 DIAGNOSIS — M25562 Pain in left knee: Secondary | ICD-10-CM | POA: Diagnosis not present

## 2024-01-30 ENCOUNTER — Other Ambulatory Visit: Payer: Self-pay | Admitting: Internal Medicine

## 2024-01-31 NOTE — Telephone Encounter (Signed)
 Requested Prescriptions  Refused Prescriptions Disp Refills   MOUNJARO 7.5 MG/0.5ML Pen [Pharmacy Med Name: Mounjaro 7.5 MG/0.5ML Subcutaneous Solution Pen-injector] 4 mL 0    Sig: INJECT 1/2 (ONE-HALF) ML SUBCUTANEOUSLY  ONCE A WEEK     Off-Protocol Failed - 01/31/2024 12:04 PM      Failed - Medication not assigned to a protocol, review manually.      Failed - Valid encounter within last 12 months    Recent Outpatient Visits   None

## 2024-01-31 NOTE — Telephone Encounter (Signed)
 Requested Prescriptions  Pending Prescriptions Disp Refills   metFORMIN (GLUCOPHAGE) 500 MG tablet [Pharmacy Med Name: METFORMIN HCL 500 MG TABS 500 Tablet] 90 tablet 2    Sig: TAKE 1 TABLET (500 MG TOTAL) BY MOUTH DAILY WITH BREAKFAST.     Endocrinology:  Diabetes - Biguanides Failed - 01/31/2024 12:28 PM      Failed - HBA1C is between 0 and 7.9 and within 180 days    Hgb A1c MFr Bld  Date Value Ref Range Status  07/26/2023 6.7 (H) <5.7 % of total Hgb Final    Comment:    For someone without known diabetes, a hemoglobin A1c value of 6.5% or greater indicates that they may have  diabetes and this should be confirmed with a follow-up  test. . For someone with known diabetes, a value <7% indicates  that their diabetes is well controlled and a value  greater than or equal to 7% indicates suboptimal  control. A1c targets should be individualized based on  duration of diabetes, age, comorbid conditions, and  other considerations. . Currently, no consensus exists regarding use of hemoglobin A1c for diagnosis of diabetes for children. .          Failed - B12 Level in normal range and within 720 days    No results found for: "VITAMINB12"       Failed - Valid encounter within last 6 months    Recent Outpatient Visits   None            Failed - CBC within normal limits and completed in the last 12 months    WBC  Date Value Ref Range Status  02/13/2023 10.1 3.8 - 10.8 Thousand/uL Final   RBC  Date Value Ref Range Status  02/13/2023 4.53 3.80 - 5.10 Million/uL Final   Hemoglobin  Date Value Ref Range Status  02/13/2023 14.0 11.7 - 15.5 g/dL Final   HCT  Date Value Ref Range Status  02/13/2023 42.3 35.0 - 45.0 % Final   MCHC  Date Value Ref Range Status  02/13/2023 33.1 32.0 - 36.0 g/dL Final   [facility]  Date Value Ref Range Status  02/13/2023 30.9 27.0 - 33.0 pg Final   MCV  Date Value Ref Range Status  02/13/2023 93.4 80.0 - 100.0 fL Final   No results found for:  "PLTCOUNTKUC", "LABPLAT", "POCPLA" RDW  Date Value Ref Range Status  02/13/2023 13.0 11.0 - 15.0 % Final         Passed - Cr in normal range and within 360 days    Creat  Date Value Ref Range Status  07/26/2023 0.82 0.50 - 1.05 mg/dL Final         Passed - eGFR in normal range and within 360 days    GFR calc Af Amer  Date Value Ref Range Status  05/16/2007 77 mL/min Final   GFR calc non Af Amer  Date Value Ref Range Status  04/08/2010 81.49 >60 mL/min Final   GFR  Date Value Ref Range Status  01/06/2021 67.81 >60.00 mL/min Final    Comment:    Calculated using the CKD-EPI Creatinine Equation (2021)   eGFR  Date Value Ref Range Status  07/26/2023 82 > OR = 60 mL/min/1.65m2 Final

## 2024-02-02 ENCOUNTER — Other Ambulatory Visit: Payer: Self-pay | Admitting: Internal Medicine

## 2024-02-04 ENCOUNTER — Other Ambulatory Visit: Payer: Self-pay | Admitting: Internal Medicine

## 2024-02-04 ENCOUNTER — Encounter: Payer: Self-pay | Admitting: Internal Medicine

## 2024-02-05 NOTE — Telephone Encounter (Signed)
 Requested medication (s) are due for refill today: No  Requested medication (s) are on the active medication list: Yes  Last refill:  01/03/24  Future visit scheduled: Yes  Notes to clinic:  Unable to refill per protocol, medication not assigned to the refill protocol.      Requested Prescriptions  Pending Prescriptions Disp Refills   MOUNJARO 7.5 MG/0.5ML Pen [Pharmacy Med Name: Mounjaro 7.5 MG/0.5ML Subcutaneous Solution Pen-injector] 4 mL 0    Sig: INJECT 1/2 (ONE-HALF) ML SUBCUTANEOUSLY  ONCE A WEEK     Off-Protocol Failed - 02/05/2024  8:53 AM      Failed - Medication not assigned to a protocol, review manually.      Failed - Valid encounter within last 12 months    Recent Outpatient Visits   None

## 2024-02-18 ENCOUNTER — Other Ambulatory Visit: Payer: Self-pay | Admitting: Internal Medicine

## 2024-02-20 NOTE — Telephone Encounter (Signed)
 Requested medication (s) are due for refill today: yes  Requested medication (s) are on the active medication list: yes  Last refill:  12/12/23 #180   Future visit scheduled: yes  Notes to clinic:  overdue lab work   Requested Prescriptions  Pending Prescriptions Disp Refills   diclofenac  (VOLTAREN ) 75 MG EC tablet [Pharmacy Med Name: DICLOFENAC  SODIUM 75 MG TAB 75 Tablet] 180 tablet 1    Sig: TAKE 1 TABLET BY MOUTH 2 TIMES DAILY.     Analgesics:  NSAIDS Failed - 02/20/2024  9:33 AM      Failed - Manual Review: Labs are only required if the patient has taken medication for more than 8 weeks.      Failed - HGB in normal range and within 360 days    Hemoglobin  Date Value Ref Range Status  02/13/2023 14.0 11.7 - 15.5 g/dL Final         Failed - PLT in normal range and within 360 days    Platelets  Date Value Ref Range Status  02/13/2023 291 140 - 400 Thousand/uL Final         Failed - HCT in normal range and within 360 days    HCT  Date Value Ref Range Status  02/13/2023 42.3 35.0 - 45.0 % Final         Failed - Valid encounter within last 12 months    Recent Outpatient Visits   None            Passed - Cr in normal range and within 360 days    Creat  Date Value Ref Range Status  07/26/2023 0.82 0.50 - 1.05 mg/dL Final         Passed - eGFR is 30 or above and within 360 days    GFR calc Af Amer  Date Value Ref Range Status  05/16/2007 77 mL/min Final   GFR calc non Af Amer  Date Value Ref Range Status  04/08/2010 81.49 >60 mL/min Final   GFR  Date Value Ref Range Status  01/06/2021 67.81 >60.00 mL/min Final    Comment:    Calculated using the CKD-EPI Creatinine Equation (2021)   eGFR  Date Value Ref Range Status  07/26/2023 82 > OR = 60 mL/min/1.58m2 Final         Passed - Patient is not pregnant

## 2024-02-21 ENCOUNTER — Encounter: Payer: Self-pay | Admitting: Internal Medicine

## 2024-03-07 ENCOUNTER — Encounter: Payer: Self-pay | Admitting: Internal Medicine

## 2024-03-07 ENCOUNTER — Ambulatory Visit (INDEPENDENT_AMBULATORY_CARE_PROVIDER_SITE_OTHER): Payer: BC Managed Care – PPO | Admitting: Internal Medicine

## 2024-03-07 VITALS — BP 122/78 | Ht 65.0 in | Wt 267.0 lb

## 2024-03-07 DIAGNOSIS — Z7984 Long term (current) use of oral hypoglycemic drugs: Secondary | ICD-10-CM

## 2024-03-07 DIAGNOSIS — Z1231 Encounter for screening mammogram for malignant neoplasm of breast: Secondary | ICD-10-CM

## 2024-03-07 DIAGNOSIS — Z1211 Encounter for screening for malignant neoplasm of colon: Secondary | ICD-10-CM | POA: Diagnosis not present

## 2024-03-07 DIAGNOSIS — E119 Type 2 diabetes mellitus without complications: Secondary | ICD-10-CM | POA: Diagnosis not present

## 2024-03-07 DIAGNOSIS — E66813 Obesity, class 3: Secondary | ICD-10-CM

## 2024-03-07 DIAGNOSIS — Z6841 Body Mass Index (BMI) 40.0 and over, adult: Secondary | ICD-10-CM

## 2024-03-07 DIAGNOSIS — Z23 Encounter for immunization: Secondary | ICD-10-CM

## 2024-03-07 DIAGNOSIS — Z0001 Encounter for general adult medical examination with abnormal findings: Secondary | ICD-10-CM

## 2024-03-07 DIAGNOSIS — Z7985 Long-term (current) use of injectable non-insulin antidiabetic drugs: Secondary | ICD-10-CM

## 2024-03-07 MED ORDER — TIRZEPATIDE 7.5 MG/0.5ML ~~LOC~~ SOAJ: 7.5000 mg | SUBCUTANEOUS | 1 refills | Status: DC

## 2024-03-07 NOTE — Progress Notes (Signed)
 Subjective:    Patient ID: Jamie Rowland, female    DOB: [birth year], 62 y.o.   MRN: [id number]  HPI  Patient presents to clinic today for her annual exam.  Flu: 07/2023 Tetanus: 07/2016 COVID: Pfizer x 2 Pneumovax: 02/2023 Shingrix: Never Pap smear: 02/2022 Mammogram: 08/2022 Bone density: 08/2022 Colon screening: 07/2014 Vision screening: annually Dentist: biannually  Diet: She does eat lean meat. She consumes fruits and veggies. She tries to avoid fried foods. She drinks mostly water. Exercise: None  Review of Systems     Past Medical History:  Diagnosis Date   Asthma    Bronchitis    Migraine headache    Seasonal allergies     Current Outpatient Medications  Medication Sig Dispense Refill   albuterol  (VENTOLIN  HFA) 108 (90 Base) MCG/ACT inhaler INHALE 2 PUFFS INTO THE LUNGS EVERY 6 HOURS AS NEEDED FOR WHEEZING OR SHORTNESS OF BREATH. 8.5 g 2   atorvastatin  (LIPITOR) 20 MG tablet TAKE 1 TABLET BY MOUTH 3 TIMES A WEEK. 45 tablet 1   diclofenac  (VOLTAREN ) 75 MG EC tablet TAKE 1 TABLET BY MOUTH 2 TIMES DAILY. 180 tablet 0   metFORMIN  (GLUCOPHAGE ) 500 MG tablet TAKE 1 TABLET (500 MG TOTAL) BY MOUTH DAILY WITH BREAKFAST. 90 tablet 0   montelukast  (SINGULAIR ) 10 MG tablet TAKE 1 TABLET (10 MG TOTAL) BY MOUTH AT BEDTIME. 90 tablet 1   sertraline  (ZOLOFT ) 25 MG tablet TAKE 1 TABLET (25 MG TOTAL) BY MOUTH DAILY. 90 tablet 1   SUMAtriptan  (IMITREX ) 25 MG tablet TAKE 1 TABLET BY MOUTH AT ONSET, MAY REPEAT IN 2 HOURS IF HEADACHE PERSISTS OR RECURS. 10 tablet 5   tirzepatide (MOUNJARO) 7.5 MG/0.5ML Pen Inject 7.5 mg into the skin once a week. 6 mL 0   No current facility-administered medications for this visit.    Allergies  Allergen Reactions   Codeine Sulfate     REACTION: Hives, can't breathe   Latex Hives    Family History  Problem Relation Age of Onset   Hypertension Mother    Diverticulitis Mother    Fibromyalgia Mother    Heart failure Mother    Uterine cancer  Sister    Asthma Maternal Grandmother    Heart disease Maternal Grandmother    Emphysema Maternal Grandmother        smoked   Diabetes Maternal Uncle    Heart disease Maternal Uncle    Colon cancer Neg Hx    Esophageal cancer Neg Hx    Rectal cancer Neg Hx    Stomach cancer Neg Hx    Breast cancer Neg Hx     Social History   Socioeconomic History   Marital status: Married    Spouse name: Not on file   Number of children: 3   Years of education: Not on file   Highest education level: Not on file  Occupational History   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  Tobacco Use   Smoking status: Never   Smokeless tobacco: Never  Vaping Use   Vaping status: Never Used  Substance and Sexual Activity   Alcohol use: No   Drug use: No   Sexual activity: Yes  Other Topics Concern   Not on file  Social History Narrative   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  Denies fever, malaise, fatigue, or abrupt weight changes.  HEENT: Denies eye pain, eye redness, ear pain, ringing in the ears, wax buildup, runny nose, nasal congestion, bloody nose, or sore throat. Respiratory: Denies difficulty breathing, shortness of breath, cough or sputum production.   Cardiovascular: Denies chest pain, chest tightness, palpitations or swelling in the hands or feet.  Gastrointestinal: Pt reports intermittent diarrhea. Denies abdominal pain, bloating, constipation, or blood in the stool.  GU:  Pt reports urinary urgency and frequency. Denies pain with  urination, burning sensation, blood in urine, odor or discharge. Musculoskeletal: Patient reports knee pain.  Denies decrease in range of motion, difficulty with gait, muscle pain or joint swelling.  Skin: Denies redness, rashes, lesions or ulcercations.  Neurological: Denies dizziness, difficulty with memory, difficulty with speech or problems with balance and coordination.  Psych: Patient has a history of anxiety.  Denies depression, SI/HI.  No other specific complaints in a complete review of systems (except as listed in HPI above).  Objective:   Physical Exam  BP 122/78 (BP Location: Left Arm, Patient Position: Sitting, Cuff Size: Large)   Ht 5\' 5"  (1.651 m)   Wt 267 lb (121.1 kg)   BMI 44.43 kg/m    Wt Readings from Last 3 Encounters:  07/26/23 (!) 303 lb (137.4 kg)  03/07/23 293 lb (132.9 kg)  02/13/23 295 lb (133.8 kg)    General: Appears her stated age, obese, in NAD. Skin: Warm, dry and intact. No ulcerations noted. HEENT: Head: normal shape and size; Eyes: sclera white, no icterus, conjunctiva pink, PERRLA and EOMs intact;  Neck:  Neck supple, trachea midline. No masses, lumps or thyromegaly present.  Cardiovascular: Normal rate and rhythm. S1,S2 noted.  No murmur, rubs or gallops noted. No JVD or BLE edema. No carotid bruits noted. Pulmonary/Chest: Normal effort and positive vesicular breath sounds. No respiratory distress. No wheezes, rales or ronchi noted.  Abdomen: Soft and nontender. Normal bowel sounds.  Musculoskeletal: Strength 5/5 BUE/BLE. Joint enlargement noted in knees. No difficulty with gait.  Neurological: Alert and oriented. Cranial nerves II-XII grossly intact. Coordination normal.  Psychiatric: Mood and affect normal. Behavior is normal. Judgment and thought content normal.    BMET    Component Value Date/Time   NA 140 07/26/2023 1010   K 4.1 07/26/2023 1010   CL 103 07/26/2023 1010   CO2 28 07/26/2023 1010   GLUCOSE 190 (H) 07/26/2023 1010    BUN 19 07/26/2023 1010   CREATININE 0.82 07/26/2023 1010   CALCIUM  9.9 07/26/2023 1010   GFRNONAA 81.49 04/08/2010 0831   GFRAA 77 05/16/2007 0947    Lipid Panel     Component Value Date/Time   CHOL 158 07/26/2023 1010   TRIG 181 (H) 07/26/2023 1010   HDL 43 (L) 07/26/2023 1010   CHOLHDL 3.7 07/26/2023 1010   VLDL 52.4 (H) 01/06/2021 1244   LDLCALC 87 07/26/2023 1010    CBC    Component Value Date/Time   WBC 10.1 02/13/2023 1332   RBC 4.53 02/13/2023 1332   HGB 14.0 02/13/2023 1332   HCT 42.3 02/13/2023 1332   PLT 291 02/13/2023 1332   MCV 93.4 02/13/2023 1332   MCH 30.9 02/13/2023 1332  MCHC 33.1 02/13/2023 1332   RDW 13.0 02/13/2023 1332   LYMPHSABS 3.3 07/04/2013 0827   MONOABS 1.0 07/04/2013 0827   EOSABS 0.5 07/04/2013 0827   BASOSABS 0.1 07/04/2013 0827    Hgb A1C Lab Results  Component Value Date   HGBA1C 6.7 (H) 07/26/2023           Assessment & Plan:   Preventative Health Maintenance:  Encouraged her to get a flu shot in the fall Tetanus UTD COVID-vaccine UTD Pneumovax UTD Shingrix today Pap smear UTD Mammogram ordered-she will call to schedule Bone density UTD Referral to GI for screening colonoscopy Encouraged her to consume a balanced diet and exercise regimen Advised her to see an eye doctor and dentist annually Will check CBC, c-Met, lipid, A1c and urine microalbumin today  RTC in 6 months, follow-up chronic conditions Helayne Lo, NP

## 2024-03-07 NOTE — Assessment & Plan Note (Signed)
 Encouraged diet and exercise for weight loss ?

## 2024-03-07 NOTE — Patient Instructions (Signed)
 Health Maintenance for Postmenopausal Women Menopause is a normal process in which your ability to get pregnant comes to an end. This process happens slowly over many months or years, usually between the ages of 24 and 62. Menopause is complete when you have missed your menstrual period for 12 months. It is important to talk with your health care provider about some of the most common conditions that affect women after menopause (postmenopausal women). These include heart disease, cancer, and bone loss (osteoporosis). Adopting a healthy lifestyle and getting preventive care can help to promote your health and wellness. The actions you take can also lower your chances of developing some of these common conditions. What are the signs and symptoms of menopause? During menopause, you may have the following symptoms: Hot flashes. These can be moderate or severe. Night sweats. Decrease in sex drive. Mood swings. Headaches. Tiredness (fatigue). Irritability. Memory problems. Problems falling asleep or staying asleep. Talk with your health care provider about treatment options for your symptoms. Do I need hormone replacement therapy? Hormone replacement therapy is effective in treating symptoms that are caused by menopause, such as hot flashes and night sweats. Hormone replacement carries certain risks, especially as you become older. If you are thinking about using estrogen or estrogen with progestin, discuss the benefits and risks with your health care provider. How can I reduce my risk for heart disease and stroke? The risk of heart disease, heart attack, and stroke increases as you age. One of the causes may be a change in the body's hormones during menopause. This can affect how your body uses dietary fats, triglycerides, and cholesterol. Heart attack and stroke are medical emergencies. There are many things that you can do to help prevent heart disease and stroke. Watch your blood pressure High  blood pressure causes heart disease and increases the risk of stroke. This is more likely to develop in people who have high blood pressure readings or are overweight. Have your blood pressure checked: Every 3-5 years if you are 50-75 years of age. Every year if you are 77 years old or older. Eat a healthy diet  Eat a diet that includes plenty of vegetables, fruits, low-fat dairy products, and lean protein. Do not eat a lot of foods that are high in solid fats, added sugars, or sodium. Get regular exercise Get regular exercise. This is one of the most important things you can do for your health. Most adults should: Try to exercise for at least 150 minutes each week. The exercise should increase your heart rate and make you sweat (moderate-intensity exercise). Try to do strengthening exercises at least twice each week. Do these in addition to the moderate-intensity exercise. Spend less time sitting. Even light physical activity can be beneficial. Other tips Work with your health care provider to achieve or maintain a healthy weight. Do not use any products that contain nicotine or tobacco. These products include cigarettes, chewing tobacco, and vaping devices, such as e-cigarettes. If you need help quitting, ask your health care provider. Know your numbers. Ask your health care provider to check your cholesterol and your blood sugar (glucose). Continue to have your blood tested as directed by your health care provider. Do I need screening for cancer? Depending on your health history and family history, you may need to have cancer screenings at different stages of your life. This may include screening for: Breast cancer. Cervical cancer. Lung cancer. Colorectal cancer. What is my risk for osteoporosis? After menopause, you may be  at increased risk for osteoporosis. Osteoporosis is a condition in which bone destruction happens more quickly than new bone creation. To help prevent osteoporosis or  the bone fractures that can happen because of osteoporosis, you may take the following actions: If you are 61-3 years old, get at least 1,000 mg of calcium and at least 600 international units (IU) of vitamin D per day. If you are older than age 61 but younger than age 75, get at least 1,200 mg of calcium and at least 600 international units (IU) of vitamin D per day. If you are older than age 62, get at least 1,200 mg of calcium and at least 800 international units (IU) of vitamin D per day. Smoking and drinking excessive alcohol increase the risk of osteoporosis. Eat foods that are rich in calcium and vitamin D, and do weight-bearing exercises several times each week as directed by your health care provider. How does menopause affect my mental health? Depression may occur at any age, but it is more common as you become older. Common symptoms of depression include: Feeling depressed. Changes in sleep patterns. Changes in appetite or eating patterns. Feeling an overall lack of motivation or enjoyment of activities that you previously enjoyed. Frequent crying spells. Talk with your health care provider if you think that you are experiencing any of these symptoms. General instructions See your health care provider for regular wellness exams and vaccines. This may include: Scheduling regular health, dental, and eye exams. Getting and maintaining your vaccines. These include: Influenza vaccine. Get this vaccine each year before the flu season begins. Pneumonia vaccine. Shingles vaccine. Tetanus, diphtheria, and pertussis (Tdap) booster vaccine. Your health care provider may also recommend other immunizations. Tell your health care provider if you have ever been abused or do not feel safe at home. Summary Menopause is a normal process in which your ability to get pregnant comes to an end. This condition causes hot flashes, night sweats, decreased interest in sex, mood swings, headaches, or lack  of sleep. Treatment for this condition may include hormone replacement therapy. Take actions to keep yourself healthy, including exercising regularly, eating a healthy diet, watching your weight, and checking your blood pressure and blood sugar levels. Get screened for cancer and depression. Make sure that you are up to date with all your vaccines. This information is not intended to replace advice given to you by your health care provider. Make sure you discuss any questions you have with your health care provider. Document Revised: 02/21/2021 Document Reviewed: 02/21/2021 Elsevier Patient Education  2024 [employer].

## 2024-03-11 ENCOUNTER — Ambulatory Visit: Payer: Self-pay | Admitting: Internal Medicine

## 2024-03-31 ENCOUNTER — Other Ambulatory Visit: Payer: Self-pay | Admitting: Internal Medicine

## 2024-03-31 DIAGNOSIS — Z0001 Encounter for general adult medical examination with abnormal findings: Secondary | ICD-10-CM

## 2024-04-01 ENCOUNTER — Encounter: Payer: Self-pay | Admitting: Pediatrics

## 2024-04-02 ENCOUNTER — Other Ambulatory Visit: Payer: Self-pay | Admitting: Internal Medicine

## 2024-04-02 NOTE — Telephone Encounter (Signed)
 Requested Prescriptions  Pending Prescriptions Disp Refills   sertraline  (ZOLOFT ) 25 MG tablet [Pharmacy Med Name: SERTRALINE  HCL 25 MG TABS 25 Tablet] 90 tablet 1    Sig: TAKE 1 TABLET (25 MG TOTAL) BY MOUTH DAILY.     Psychiatry:  Antidepressants - SSRI - sertraline  Failed - 04/02/2024 12:03 PM      Failed - AST in normal range and within 360 days    AST  Date Value Ref Range Status  03/07/2024 9 (L) 10 - 35 U/L Final         Failed - Valid encounter within last 6 months    Recent Outpatient Visits           3 weeks ago Encounter for general adult medical examination with abnormal findings   [place] [facility] [place], [place] W, NP              Passed - ALT in normal range and within 360 days    ALT  Date Value Ref Range Status  03/07/2024 15 6 - 29 U/L Final         Passed - Completed PHQ-2 or PHQ-9 in the last 360 days       atorvastatin  (LIPITOR) 20 MG tablet [Pharmacy Med Name: ATORVASTATIN  20 MG TABLET 20 Tablet] 45 tablet 1    Sig: TAKE 1 TABLET BY MOUTH 3 TIMES A WEEK.     Cardiovascular:  Antilipid - Statins Failed - 04/02/2024 12:03 PM      Failed - Valid encounter within last 12 months    Recent Outpatient Visits           3 weeks ago Encounter for general adult medical examination with abnormal findings   [place] [facility] [place], [place] W, NP              Failed - Lipid Panel in normal range within the last 12 months    Cholesterol  Date Value Ref Range Status  03/07/2024 127 <200 mg/dL Final   LDL Cholesterol (Calc)  Date Value Ref Range Status  03/07/2024 68 mg/dL (calc) Final    Comment:    Reference range: <100 . Desirable range <100 mg/dL for primary prevention;   <70 mg/dL for patients with CHD or diabetic patients  with > or = 2 CHD risk factors. Aaron Aas LDL-C is now calculated using the Martin-Hopkins  calculation, which is a validated novel method providing  better accuracy than the Friedewald  equation in the  estimation of LDL-C.  Melinda Sprawls et al. Erroll Heard. 4098;119(14): 2061-2068  (http://education.[employer].com/faq/FAQ164)    Direct LDL  Date Value Ref Range Status  01/06/2021 132.0 mg/dL Final    Comment:    Optimal:  <100 mg/dLNear or Above Optimal:  100-129 mg/dLBorderline High:  130-159 mg/dLHigh:  160-189 mg/dLVery High:  >190 mg/dL   HDL  Date Value Ref Range Status  03/07/2024 40 (L) > OR = 50 mg/dL Final   Triglycerides  Date Value Ref Range Status  03/07/2024 106 <150 mg/dL Final         Passed - Patient is not pregnant

## 2024-04-04 NOTE — Telephone Encounter (Signed)
 Requested medication (s) are due for refill today: yes   Requested medication (s) are on the active medication list:  yes   Last refill:  03/07/24 #6 ml 1 refills  Future visit scheduled: yes 09/16/24  Notes to clinic:  medication not assigned to a protocol. Do you want to refill Rx?     Requested Prescriptions  Pending Prescriptions Disp Refills   MOUNJARO  7.5 MG/0.5ML Pen [Pharmacy Med Name: Mounjaro  7.5 MG/0.5ML Subcutaneous Solution Pen-injector] 4 mL 0    Sig: INJECT 1/2 (ONE-HALF) ML SUBCUTANEOUSLY  ONCE A WEEK     Off-Protocol Failed - 04/04/2024 12:24 PM      Failed - Medication not assigned to a protocol, review manually.      Failed - Valid encounter within last 12 months    Recent Outpatient Visits           4 weeks ago Encounter for general adult medical examination with abnormal findings    [facility] [place], [place], [place]

## 2024-04-08 ENCOUNTER — Encounter

## 2024-04-11 DIAGNOSIS — M170 Bilateral primary osteoarthritis of knee: Secondary | ICD-10-CM | POA: Diagnosis not present

## 2024-04-23 ENCOUNTER — Encounter: Payer: Self-pay | Admitting: Pediatrics

## 2024-04-23 ENCOUNTER — Ambulatory Visit (AMBULATORY_SURGERY_CENTER)

## 2024-04-23 VITALS — Ht 63.0 in | Wt 262.0 lb

## 2024-04-23 DIAGNOSIS — Z1211 Encounter for screening for malignant neoplasm of colon: Secondary | ICD-10-CM

## 2024-04-23 MED ORDER — NA SULFATE-K SULFATE-MG SULF 17.5-3.13-1.6 GM/177ML PO SOLN: 1.0000 | Freq: Once | ORAL | 0 refills | Status: AC

## 2024-04-23 NOTE — Progress Notes (Signed)

## 2024-04-29 ENCOUNTER — Other Ambulatory Visit: Payer: Self-pay | Admitting: Internal Medicine

## 2024-04-30 NOTE — Telephone Encounter (Signed)
 Requested Prescriptions  Pending Prescriptions Disp Refills   metFORMIN  (GLUCOPHAGE ) 500 MG tablet [Pharmacy Med Name: METFORMIN  HCL 500 MG TABS 500 Tablet] 90 tablet 0    Sig: TAKE 1 TABLET (500 MG TOTAL) BY MOUTH DAILY WITH BREAKFAST.     Endocrinology:  Diabetes - Biguanides Failed - 04/30/2024  1:31 PM      Failed - B12 Level in normal range and within 720 days    No results found for: VITAMINB12       Failed - CBC within normal limits and completed in the last 12 months    WBC  Date Value Ref Range Status  03/07/2024 9.8 3.8 - 10.8 Thousand/uL Final   RBC  Date Value Ref Range Status  03/07/2024 4.69 3.80 - 5.10 Million/uL Final   Hemoglobin  Date Value Ref Range Status  03/07/2024 14.2 11.7 - 15.5 g/dL Final   HCT  Date Value Ref Range Status  03/07/2024 44.8 35.0 - 45.0 % Final   MCHC  Date Value Ref Range Status  03/07/2024 31.7 (L) 32.0 - 36.0 g/dL Final    Comment:    For adults, a slight decrease in the calculated MCHC value (in the range of 30 to 32 g/dL) is most likely not clinically significant; however, it should be interpreted with caution in correlation with other red cell parameters and the patient's clinical condition.    [facility]  Date Value Ref Range Status  03/07/2024 30.3 27.0 - 33.0 pg Final   MCV  Date Value Ref Range Status  03/07/2024 95.5 80.0 - 100.0 fL Final   No results found for: PLTCOUNTKUC, LABPLAT, POCPLA RDW  Date Value Ref Range Status  03/07/2024 13.3 11.0 - 15.0 % Final         Passed - Cr in normal range and within 360 days    Creat  Date Value Ref Range Status  03/07/2024 0.86 0.50 - 1.05 mg/dL Final   Creatinine, Urine  Date Value Ref Range Status  03/07/2024 107 20 - 275 mg/dL Final         Passed - HBA1C is between 0 and 7.9 and within 180 days    Hgb A1c MFr Bld  Date Value Ref Range Status  03/07/2024 5.9 (H) <5.7 % Final    Comment:    For someone without known diabetes, a hemoglobin  A1c value  between 5.7% and 6.4% is consistent with prediabetes and should be confirmed with a  follow-up test. . For someone with known diabetes, a value <7% indicates that their diabetes is well controlled. A1c targets should be individualized based on duration of diabetes, age, comorbid conditions, and other considerations. . This assay result is consistent with an increased risk of diabetes. . Currently, no consensus exists regarding use of hemoglobin A1c for diagnosis of diabetes for children. .          Passed - eGFR in normal range and within 360 days    GFR calc Af Amer  Date Value Ref Range Status  05/16/2007 77 mL/min Final   GFR calc non Af Amer  Date Value Ref Range Status  04/08/2010 81.49 >60 mL/min Final   GFR  Date Value Ref Range Status  01/06/2021 67.81 >60.00 mL/min Final    Comment:    Calculated using the CKD-EPI Creatinine Equation (2021)   eGFR  Date Value Ref Range Status  03/07/2024 77 > OR = 60 mL/min/1.32m2 Final         Passed - Valid  encounter within last 6 months    Recent Outpatient Visits           1 month ago Encounter for general adult medical examination with abnormal findings    [facility] [place], Angeline ORN, [place]

## 2024-05-13 NOTE — Progress Notes (Unsigned)
 [facility] History and Physical   Primary Care Physician:  Antonette Angeline ORN, NP   Reason for Procedure:  Colorectal cancer screening  Plan:    Screening colonoscopy     HPI: Jamie Rowland is a 63 y.o. female undergoing screening colonoscopy for colorectal cancer screening.  Patient's last colonoscopy was performed in 2015 and was overall normal-mild diverticulosis in the sigmoid colon and internal hemorrhoids.  No polyps.  No family history of colorectal cancer or polyps.  Patient denies current symptoms of change in bowel habits or rectal bleeding.   Past Medical History:  Diagnosis Date   Asthma    Bronchitis    Migraine headache    Seasonal allergies     Past Surgical History:  Procedure Laterality Date   CESAREAN SECTION  8010,8009, 2002   KNEE ARTHROSCOPY Right 2013   LAPAROSCOPIC CHOLECYSTECTOMY  1991   TUBAL LIGATION  2002    Prior to Admission medications   Medication Sig Start Date End Date Taking? Authorizing Provider  albuterol  (VENTOLIN  HFA) 108 (90 Base) MCG/ACT inhaler INHALE 2 PUFFS INTO THE LUNGS EVERY 6 HOURS AS NEEDED FOR WHEEZING OR SHORTNESS OF BREATH. 09/12/23   Antonette Angeline ORN, NP  atorvastatin  (LIPITOR) 20 MG tablet TAKE 1 TABLET BY MOUTH 3 TIMES A WEEK. Patient taking differently: Take 20 mg by mouth every other day. 04/02/24   Antonette Angeline ORN, NP  diclofenac  (VOLTAREN ) 75 MG EC tablet TAKE 1 TABLET BY MOUTH 2 TIMES DAILY. 02/20/24   Antonette Angeline ORN, NP  metFORMIN  (GLUCOPHAGE ) 500 MG tablet TAKE 1 TABLET (500 MG TOTAL) BY MOUTH DAILY WITH BREAKFAST. 04/30/24   Antonette Angeline ORN, NP  montelukast  (SINGULAIR ) 10 MG tablet TAKE 1 TABLET (10 MG TOTAL) BY MOUTH AT BEDTIME. 12/28/23   Baity, Angeline ORN, NP  sertraline  (ZOLOFT ) 25 MG tablet TAKE 1 TABLET (25 MG TOTAL) BY MOUTH DAILY. 04/02/24   Antonette Angeline ORN, NP  SUMAtriptan  (IMITREX ) 25 MG tablet TAKE 1 TABLET BY MOUTH AT ONSET, MAY REPEAT IN 2 HOURS IF HEADACHE PERSISTS OR RECURS. Patient taking  differently: Take 25 mg by mouth every 2 (two) hours as needed. TAKE 1 TABLET BY MOUTH AT ONSET, MAY REPEAT IN 2 HOURS IF HEADACHE PERSISTS OR RECURS. 10/02/23   Antonette Angeline ORN, NP  tirzepatide  (MOUNJARO ) 7.5 MG/0.5ML Pen Inject 7.5 mg into the skin once a week. 03/07/24   Antonette Angeline ORN, NP    Current Outpatient Medications  Medication Sig Dispense Refill   albuterol  (VENTOLIN  HFA) 108 (90 Base) MCG/ACT inhaler INHALE 2 PUFFS INTO THE LUNGS EVERY 6 HOURS AS NEEDED FOR WHEEZING OR SHORTNESS OF BREATH. 8.5 g 2   atorvastatin  (LIPITOR) 20 MG tablet TAKE 1 TABLET BY MOUTH 3 TIMES A WEEK. (Patient taking differently: Take 20 mg by mouth every other day.) 45 tablet 1   diclofenac  (VOLTAREN ) 75 MG EC tablet TAKE 1 TABLET BY MOUTH 2 TIMES DAILY. 180 tablet 0   metFORMIN  (GLUCOPHAGE ) 500 MG tablet TAKE 1 TABLET (500 MG TOTAL) BY MOUTH DAILY WITH BREAKFAST. 90 tablet 0   montelukast  (SINGULAIR ) 10 MG tablet TAKE 1 TABLET (10 MG TOTAL) BY MOUTH AT BEDTIME. 90 tablet 1   sertraline  (ZOLOFT ) 25 MG tablet TAKE 1 TABLET (25 MG TOTAL) BY MOUTH DAILY. 90 tablet 1   SUMAtriptan  (IMITREX ) 25 MG tablet TAKE 1 TABLET BY MOUTH AT ONSET, MAY REPEAT IN 2 HOURS IF HEADACHE PERSISTS OR RECURS. (Patient taking differently: Take 25 mg by mouth every 2 (  two) hours as needed. TAKE 1 TABLET BY MOUTH AT ONSET, MAY REPEAT IN 2 HOURS IF HEADACHE PERSISTS OR RECURS.) 10 tablet 5   tirzepatide  (MOUNJARO ) 7.5 MG/0.5ML Pen Inject 7.5 mg into the skin once a week. 6 mL 1   No current facility-administered medications for this visit.    Allergies as of 05/14/2024 - Review Complete 04/23/2024  Allergen Reaction Noted   Codeine sulfate     Latex Hives 08/29/2013    Family History  Problem Relation Age of Onset   Hypertension Mother    Diverticulitis Mother    Fibromyalgia Mother    Heart failure Mother    Uterine cancer Sister    Asthma Maternal Grandmother    Heart disease Maternal Grandmother    Emphysema Maternal  Grandmother        smoked   Diabetes Maternal Uncle    Heart disease Maternal Uncle    Colon cancer Neg Hx    Esophageal cancer Neg Hx    Rectal cancer Neg Hx    Stomach cancer Neg Hx    Breast cancer Neg Hx     Social History   Socioeconomic History   Marital status: Married    Spouse name: Not on file   Number of children: 3   Years of education: Not on file   Highest education level: Not on file  Occupational History   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  Tobacco Use   Smoking status: Never   Smokeless tobacco: Never  Vaping Use   Vaping status: Never Used  Substance and Sexual Activity   Alcohol use: No   Drug use: No   Sexual activity: Yes  Other Topics Concern   Not on file  Social History Narrative   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  Physical Exam: Vital signs There were no vitals taken for this visit.  General:   Alert,  Well-developed, well-nourished, pleasant and cooperative in NAD Airway:  Mallampati  Lungs:  Clear throughout to auscultation.   Heart:  Regular rate and rhythm; no murmurs, clicks, rubs,  or gallops. Abdomen:  Soft, nontender and nondistended. Normal bowel sounds.   Neuro/Psych:  Normal mood and affect. A and O x 3  Inocente Hausen, MD [facility]

## 2024-05-14 ENCOUNTER — Ambulatory Visit (AMBULATORY_SURGERY_CENTER): Admitting: Pediatrics

## 2024-05-14 ENCOUNTER — Encounter: Payer: Self-pay | Admitting: Pediatrics

## 2024-05-14 VITALS — BP 157/95 | HR 73 | Temp 97.2°F | Resp 23 | Ht 63.0 in | Wt 262.0 lb

## 2024-05-14 DIAGNOSIS — Z1211 Encounter for screening for malignant neoplasm of colon: Secondary | ICD-10-CM

## 2024-05-14 DIAGNOSIS — K648 Other hemorrhoids: Secondary | ICD-10-CM | POA: Diagnosis not present

## 2024-05-14 DIAGNOSIS — K5730 Diverticulosis of large intestine without perforation or abscess without bleeding: Secondary | ICD-10-CM

## 2024-05-14 HISTORY — DX: Morbid (severe) obesity due to excess calories: E66.01

## 2024-05-14 MED ORDER — SODIUM CHLORIDE 0.9 % IV SOLN: 500.0000 mL | Freq: Once | INTRAVENOUS | Status: DC

## 2024-05-14 NOTE — Patient Instructions (Signed)
 Handout on hemorrhoids and diverticulosis given to patient Resume previous diet and continue present medications Repeat colonoscopy for surveillance in 10 years    YOU HAD AN ENDOSCOPIC PROCEDURE TODAY AT THE [facility]:   Refer to the procedure report that was given to you for any specific questions about what was found during the examination.  If the procedure report does not answer your questions, please call your gastroenterologist to clarify.  If you requested that your care partner not be given the details of your procedure findings, then the procedure report has been included in a sealed envelope for you to review at your convenience later.  YOU SHOULD EXPECT: Some feelings of bloating in the abdomen. Passage of more gas than usual.  Walking can help get rid of the air that was put into your GI tract during the procedure and reduce the bloating. If you had a lower endoscopy (such as a colonoscopy or flexible sigmoidoscopy) you may notice spotting of blood in your stool or on the toilet paper. If you underwent a bowel prep for your procedure, you may not have a normal bowel movement for a few days.  Please Note:  You might notice some irritation and congestion in your nose or some drainage.  This is from the oxygen used during your procedure.  There is no need for concern and it should clear up in a day or so.  SYMPTOMS TO REPORT IMMEDIATELY:  Following lower endoscopy (colonoscopy or flexible sigmoidoscopy):  Excessive amounts of blood in the stool  Significant tenderness or worsening of abdominal pains  Swelling of the abdomen that is new, acute  Fever of 100F or higher  For urgent or emergent issues, a gastroenterologist can be reached at any hour by calling [phone]. Do not use MyChart messaging for urgent concerns.    DIET:  We do recommend a small meal at first, but then you may proceed to your regular diet.  Drink plenty of fluids but you should avoid  alcoholic beverages for 24 hours.  ACTIVITY:  You should plan to take it easy for the rest of today and you should NOT DRIVE or use heavy machinery until tomorrow (because of the sedation medicines used during the test).    FOLLOW UP: Our staff will call the number listed on your records the next business day following your procedure.  We will call around 7:15- 8:00 am to check on you and address any questions or concerns that you may have regarding the information given to you following your procedure. If we do not reach you, we will leave a message.     If any biopsies were taken you will be contacted by phone or by letter within the next 1-3 weeks.  Please call us  at [phone] if you have not heard about the biopsies in 3 weeks.    SIGNATURES/CONFIDENTIALITY: You and/or your care partner have signed paperwork which will be entered into your electronic medical record.  These signatures attest to the fact that that the information above on your After Visit Summary has been reviewed and is understood.  Full responsibility of the confidentiality of this discharge information lies with you and/or your care-partner.

## 2024-05-14 NOTE — Op Note (Signed)
 [facility] Patient Name: Jamie Rowland Procedure Date: 05/14/2024 11:36 AM MRN: [id number] Endoscopist: Inocente Hausen , MD, [id number] Age: 63 Referring MD:  Date of Birth: 05-01-1961 Gender: Female Account #: [id number] Procedure:                Colonoscopy Indications:              Screening for colorectal malignant neoplasm, Last                            colonoscopy: 2015 Medicines:                Monitored Anesthesia Care Procedure:                Pre-Anesthesia Assessment:                           - Prior to the procedure, a History and Physical                            was performed, and patient medications and                            allergies were reviewed. The patient's tolerance of                            previous anesthesia was also reviewed. The risks                            and benefits of the procedure and the sedation                            options and risks were discussed with the patient.                            All questions were answered, and informed consent                            was obtained. Prior Anticoagulants: The patient has                            taken no anticoagulant or antiplatelet agents. ASA                            Grade Assessment: III - A patient with severe                            systemic disease. After reviewing the risks and                            benefits, the patient was deemed in satisfactory                            condition to undergo the procedure.  After obtaining informed consent, the colonoscope                            was passed under direct vision. Throughout the                            procedure, the patient's blood pressure, pulse, and                            oxygen saturations were monitored continuously. The                            Olympus Scope SN [id number] was introduced through the                            anus and advanced to the cecum,  identified by                            appendiceal orifice and ileocecal valve. The                            colonoscopy was performed without difficulty. The                            patient tolerated the procedure well. The quality                            of the bowel preparation was good. The ileocecal                            valve, appendiceal orifice, and rectum were                            photographed. Scope In: 11:46:16 AM Scope Out: 11:58:50 AM Scope Withdrawal Time: 0 hours 8 minutes 34 seconds  Total Procedure Duration: 0 hours 12 minutes 34 seconds  Findings:                 The perianal and digital rectal examinations were                            normal. Pertinent negatives include normal                            sphincter tone and no palpable rectal lesions.                           Multiple small-mouthed diverticula were found in                            the sigmoid colon.                           The exam was otherwise normal throughout the  examined colon.                           Internal hemorrhoids were found during retroflexion. Complications:            No immediate complications. Estimated blood loss:                            None. Estimated Blood Loss:     Estimated blood loss: none. Impression:               - Diverticulosis in the sigmoid colon.                           - Internal hemorrhoids.                           - No specimens collected. Recommendation:           - Discharge patient to home (ambulatory).                           - Repeat colonoscopy in 10 years for screening                            purposes.                           - The findings and recommendations were discussed                            with the patient.                           - Return to referring physician.                           - Patient has a contact number available for                            emergencies.  The signs and symptoms of potential                            delayed complications were discussed with the                            patient. Return to normal activities tomorrow.                            Written discharge instructions were provided to the                            patient. Inocente Hausen, MD 05/14/2024 12:03:00 PM This report has been signed electronically.

## 2024-05-14 NOTE — Progress Notes (Signed)
 Report given to PACU, vss

## 2024-05-14 NOTE — Progress Notes (Signed)
 Pt's states no medical or surgical changes since previsit or office visit.

## 2024-05-15 ENCOUNTER — Telehealth: Payer: Self-pay | Admitting: *Deleted

## 2024-05-15 NOTE — Telephone Encounter (Signed)
  Follow up Call-     05/14/2024   10:51 AM  Call back number  Post procedure Call Back phone  # [phone]  Permission to leave phone message Yes     Patient questions:  Do you have a fever, pain , or abdominal swelling? No. Pain Score  0 *  Have you tolerated food without any problems? Yes.    Have you been able to return to your normal activities? Yes.    Do you have any questions about your discharge instructions: Diet   No. Medications  No. Follow up visit  No.  Do you have questions or concerns about your Care? No.  Actions: * If pain score is 4 or above: No action needed, pain <4.

## 2024-05-26 ENCOUNTER — Other Ambulatory Visit: Payer: Self-pay | Admitting: Internal Medicine

## 2024-05-29 NOTE — Telephone Encounter (Signed)
 Requested Prescriptions  Pending Prescriptions Disp Refills   diclofenac (VOLTAREN) 75 MG EC tablet [Pharmacy Med Name: DICLOFENAC SODIUM 75 MG TAB 75 Tablet] 180 tablet 1    Sig: TAKE 1 TABLET BY MOUTH 2 TIMES DAILY.     Analgesics:  NSAIDS Failed - 05/29/2024 11:40 AM      Failed - Manual Review: Labs are only required if the patient has taken medication for more than 8 weeks.      Passed - Cr in normal range and within 360 days    Creat  Date Value Ref Range Status  03/07/2024 0.86 0.50 - 1.05 mg/dL Final   Creatinine, Urine  Date Value Ref Range Status  03/07/2024 107 20 - 275 mg/dL Final         Passed - HGB in normal range and within 360 days    Hemoglobin  Date Value Ref Range Status  03/07/2024 14.2 11.7 - 15.5 g/dL Final         Passed - PLT in normal range and within 360 days    Platelets  Date Value Ref Range Status  03/07/2024 289 140 - 400 Thousand/uL Final         Passed - HCT in normal range and within 360 days    HCT  Date Value Ref Range Status  03/07/2024 44.8 35.0 - 45.0 % Final         Passed - eGFR is 30 or above and within 360 days    GFR calc Af Amer  Date Value Ref Range Status  05/16/2007 77 mL/min Final   GFR calc non Af Amer  Date Value Ref Range Status  04/08/2010 81.49 >60 mL/min Final   GFR  Date Value Ref Range Status  01/06/2021 67.81 >60.00 mL/min Final    Comment:    Calculated using the CKD-EPI Creatinine Equation (2021)   eGFR  Date Value Ref Range Status  03/07/2024 77 > OR = 60 mL/min/1.13m2 Final         Passed - Patient is not pregnant      Passed - Valid encounter within last 12 months    Recent Outpatient Visits           2 months ago Encounter for general adult medical examination with abnormal findings   [place] [facility] [place], Angeline ORN, NP

## 2024-07-09 DIAGNOSIS — M170 Bilateral primary osteoarthritis of knee: Secondary | ICD-10-CM | POA: Diagnosis not present

## 2024-07-10 ENCOUNTER — Other Ambulatory Visit: Payer: Self-pay | Admitting: Internal Medicine

## 2024-07-10 DIAGNOSIS — Z0001 Encounter for general adult medical examination with abnormal findings: Secondary | ICD-10-CM

## 2024-07-11 NOTE — Telephone Encounter (Signed)
 Requested Prescriptions  Pending Prescriptions Disp Refills   SUMAtriptan  (IMITREX ) 25 MG tablet [Pharmacy Med Name: SUMATRIPTAN  SUCCINATE 25 MG 25 Tablet] 10 tablet 5    Sig: TAKE 1 TABLET BY MOUTH AT ONSET, MAY REPEAT IN 2 HOURS IF HEADACHE PERSISTS OR RECURS.     Neurology:  Migraine Therapy - Triptan Failed - 07/11/2024  2:23 PM      Failed - Last BP in normal range    BP Readings from Last 1 Encounters:  05/14/24 (!) 157/95         Passed - Valid encounter within last 12 months    Recent Outpatient Visits           4 months ago Encounter for general adult medical examination with abnormal findings   [place] [facility] [place], Angeline ORN, NP

## 2024-07-17 ENCOUNTER — Encounter: Payer: Self-pay | Admitting: Internal Medicine

## 2024-08-04 ENCOUNTER — Other Ambulatory Visit: Payer: Self-pay | Admitting: Internal Medicine

## 2024-08-04 DIAGNOSIS — Z0001 Encounter for general adult medical examination with abnormal findings: Secondary | ICD-10-CM

## 2024-08-05 NOTE — Telephone Encounter (Signed)
 Requested Prescriptions  Pending Prescriptions Disp Refills   metFORMIN  (GLUCOPHAGE ) 500 MG tablet [Pharmacy Med Name: METFORMIN  HCL 500 MG TABS 500 Tablet] 90 tablet 0    Sig: TAKE 1 TABLET (500 MG TOTAL) BY MOUTH DAILY WITH BREAKFAST.     Endocrinology:  Diabetes - Biguanides Failed - 08/05/2024  4:36 PM      Failed - B12 Level in normal range and within 720 days    No results found for: VITAMINB12       Failed - CBC within normal limits and completed in the last 12 months    WBC  Date Value Ref Range Status  03/07/2024 9.8 3.8 - 10.8 Thousand/uL Final   RBC  Date Value Ref Range Status  03/07/2024 4.69 3.80 - 5.10 Million/uL Final   Hemoglobin  Date Value Ref Range Status  03/07/2024 14.2 11.7 - 15.5 g/dL Final   HCT  Date Value Ref Range Status  03/07/2024 44.8 35.0 - 45.0 % Final   MCHC  Date Value Ref Range Status  03/07/2024 31.7 (L) 32.0 - 36.0 g/dL Final    Comment:    For adults, a slight decrease in the calculated MCHC value (in the range of 30 to 32 g/dL) is most likely not clinically significant; however, it should be interpreted with caution in correlation with other red cell parameters and the patient's clinical condition.    [facility]  Date Value Ref Range Status  03/07/2024 30.3 27.0 - 33.0 pg Final   MCV  Date Value Ref Range Status  03/07/2024 95.5 80.0 - 100.0 fL Final   No results found for: PLTCOUNTKUC, LABPLAT, POCPLA RDW  Date Value Ref Range Status  03/07/2024 13.3 11.0 - 15.0 % Final         Passed - Cr in normal range and within 360 days    Creat  Date Value Ref Range Status  03/07/2024 0.86 0.50 - 1.05 mg/dL Final   Creatinine, Urine  Date Value Ref Range Status  03/07/2024 107 20 - 275 mg/dL Final         Passed - HBA1C is between 0 and 7.9 and within 180 days    Hgb A1c MFr Bld  Date Value Ref Range Status  03/07/2024 5.9 (H) <5.7 % Final    Comment:    For someone without known diabetes, a hemoglobin  A1c value  between 5.7% and 6.4% is consistent with prediabetes and should be confirmed with a  follow-up test. . For someone with known diabetes, a value <7% indicates that their diabetes is well controlled. A1c targets should be individualized based on duration of diabetes, age, comorbid conditions, and other considerations. . This assay result is consistent with an increased risk of diabetes. . Currently, no consensus exists regarding use of hemoglobin A1c for diagnosis of diabetes for children. .          Passed - eGFR in normal range and within 360 days    GFR calc Af Amer  Date Value Ref Range Status  05/16/2007 77 mL/min Final   GFR calc non Af Amer  Date Value Ref Range Status  04/08/2010 81.49 >60 mL/min Final   GFR  Date Value Ref Range Status  01/06/2021 67.81 >60.00 mL/min Final    Comment:    Calculated using the CKD-EPI Creatinine Equation (2021)   eGFR  Date Value Ref Range Status  03/07/2024 77 > OR = 60 mL/min/1.42m2 Final         Passed - Valid  encounter within last 6 months    Recent Outpatient Visits           5 months ago Encounter for general adult medical examination with abnormal findings   [place] [facility] [place], Angeline ORN, NP               montelukast  (SINGULAIR ) 10 MG tablet [Pharmacy Med Name: MONTELUKAST  SODIUM 10 MG TA 10 Tablet] 90 tablet 1    Sig: TAKE 1 TABLET (10 MG TOTAL) BY MOUTH AT BEDTIME.     Pulmonology:  Leukotriene Inhibitors Passed - 08/05/2024  4:36 PM      Passed - Valid encounter within last 12 months    Recent Outpatient Visits           5 months ago Encounter for general adult medical examination with abnormal findings   [place] [facility] [place], Angeline ORN, NP               albuterol  (VENTOLIN  HFA) 108 [phone] Base) MCG/ACT inhaler [Pharmacy Med Name: ALBUTEROL  SULF HFA 90 MCG 108 (90 BAS Aerosol] 8.5 g 2    Sig: INHALE 2 PUFFS INTO THE LUNGS EVERY 6 HOURS AS NEEDED FOR  WHEEZING OR SHORTNESS OF BREATH.     Pulmonology:  Beta Agonists 2 Failed - 08/05/2024  4:36 PM      Failed - Last BP in normal range    BP Readings from Last 1 Encounters:  05/14/24 (!) 157/95         Passed - Last Heart Rate in normal range    Pulse Readings from Last 1 Encounters:  05/14/24 73         Passed - Valid encounter within last 12 months    Recent Outpatient Visits           5 months ago Encounter for general adult medical examination with abnormal findings   [place] [facility] [place], Angeline ORN, NP

## 2024-08-15 LAB — LIPID PANEL
Cholesterol: 127 mg/dL (ref <200)
HDL: 40 mg/dL — ABNORMAL LOW (ref >=50)
LDL Cholesterol (Calc): 68 mg/dL
Non-HDL Cholesterol (Calc): 87 mg/dL (ref <130)
Total CHOL/HDL Ratio: 3.2 (calc) (ref <5.0)
Triglycerides: 106 mg/dL (ref <150)

## 2024-08-15 LAB — COMPREHENSIVE METABOLIC PANEL WITH GFR
AG Ratio: 1.3 (calc) (ref 1.0–2.5)
ALT: 15 U/L (ref 6–29)
AST: 9 U/L — ABNORMAL LOW (ref 10–35)
Albumin: 4 g/dL (ref 3.6–5.1)
Alkaline phosphatase (APISO): 68 U/L (ref 37–153)
BUN: 21 mg/dL (ref 7–25)
CO2: 30 mmol/L (ref 20–32)
Calcium: 9.6 mg/dL (ref 8.6–10.4)
Chloride: 105 mmol/L (ref 98–110)
Creat: 0.86 mg/dL (ref 0.50–1.05)
Globulin: 3 g/dL (ref 1.9–3.7)
Glucose, Bld: 104 mg/dL (ref 65–139)
Potassium: 4.6 mmol/L (ref 3.5–5.3)
Sodium: 143 mmol/L (ref 135–146)
Total Bilirubin: 0.5 mg/dL (ref 0.2–1.2)
Total Protein: 7 g/dL (ref 6.1–8.1)
eGFR: 77 mL/min/1.73m2 (ref >=60)

## 2024-08-15 LAB — MICROALBUMIN / CREATININE URINE RATIO
Creatinine, Urine: 107 mg/dL (ref 20–275)
Microalb, Ur: <0.2 mg/dL

## 2024-08-15 LAB — HEMOGLOBIN A1C
Hgb A1c MFr Bld: 5.9 % — ABNORMAL HIGH (ref <5.7)
Mean Plasma Glucose: 123 mg/dL
eAG (mmol/L): 6.8 mmol/L

## 2024-08-15 LAB — CBC
HCT: 44.8 % (ref 35.0–45.0)
Hemoglobin: 14.2 g/dL (ref 11.7–15.5)
MCH: 30.3 pg (ref 27.0–33.0)
MCHC: 31.7 g/dL — ABNORMAL LOW (ref 32.0–36.0)
MCV: 95.5 fL (ref 80.0–100.0)
MPV: 11.4 fL (ref 7.5–12.5)
Platelets: 289 Thousand/uL (ref 140–400)
RBC: 4.69 Million/uL (ref 3.80–5.10)
RDW: 13.3 % (ref 11.0–15.0)
WBC: 9.8 Thousand/uL (ref 3.8–10.8)

## 2024-09-16 ENCOUNTER — Ambulatory Visit: Admitting: Internal Medicine

## 2024-09-18 ENCOUNTER — Encounter: Payer: Self-pay | Admitting: Internal Medicine

## 2024-09-18 ENCOUNTER — Ambulatory Visit: Admitting: Internal Medicine

## 2024-09-18 VITALS — BP 118/82 | Ht 62.0 in | Wt 252.0 lb

## 2024-09-18 DIAGNOSIS — N3946 Mixed incontinence: Secondary | ICD-10-CM

## 2024-09-18 DIAGNOSIS — J4530 Mild persistent asthma, uncomplicated: Secondary | ICD-10-CM

## 2024-09-18 DIAGNOSIS — E782 Mixed hyperlipidemia: Secondary | ICD-10-CM

## 2024-09-18 DIAGNOSIS — Z23 Encounter for immunization: Secondary | ICD-10-CM

## 2024-09-18 DIAGNOSIS — Z6841 Body Mass Index (BMI) 40.0 and over, adult: Secondary | ICD-10-CM

## 2024-09-18 DIAGNOSIS — K219 Gastro-esophageal reflux disease without esophagitis: Secondary | ICD-10-CM

## 2024-09-18 DIAGNOSIS — Z7985 Long-term (current) use of injectable non-insulin antidiabetic drugs: Secondary | ICD-10-CM

## 2024-09-18 DIAGNOSIS — E119 Type 2 diabetes mellitus without complications: Secondary | ICD-10-CM | POA: Diagnosis not present

## 2024-09-18 DIAGNOSIS — E1169 Type 2 diabetes mellitus with other specified complication: Secondary | ICD-10-CM

## 2024-09-18 DIAGNOSIS — G43C1 Periodic headache syndromes in child or adult, intractable: Secondary | ICD-10-CM | POA: Diagnosis not present

## 2024-09-18 DIAGNOSIS — F411 Generalized anxiety disorder: Secondary | ICD-10-CM

## 2024-09-18 DIAGNOSIS — M170 Bilateral primary osteoarthritis of knee: Secondary | ICD-10-CM

## 2024-09-18 DIAGNOSIS — Z7984 Long term (current) use of oral hypoglycemic drugs: Secondary | ICD-10-CM

## 2024-09-18 NOTE — Assessment & Plan Note (Signed)
 Encouraged diet and exercise for weight loss ?

## 2024-09-18 NOTE — Assessment & Plan Note (Signed)
 Complicated by morbid obesity Actively working on weight loss so that she is able to get a knee replacement Continue diclofenac  75 mg twice daily she will continue to follow with orthopedics

## 2024-09-18 NOTE — Assessment & Plan Note (Signed)
 Continue montelukast  10 mg daily and albuterol  108 mcg per actuation every 4 hours as needed as prescribed

## 2024-09-18 NOTE — Assessment & Plan Note (Signed)
 Complicated by morbid obesity Avoid foods that trigger reflux Encourage weight loss as this can help reduce reflux symptoms Okay to take tums OTC as needed

## 2024-09-18 NOTE — Progress Notes (Signed)
 Subjective:    Patient ID: Jamie Rowland, female    DOB: 02-22-1961, 63 y.o.   MRN: [id number]  HPI  Patient presents to clinic today for follow-up of chronic conditions.  Asthma: Moderate, persistent, worse with the change in the weather. Managed on montelukast  and albuterol  as needed.  There are no PFTs on file.  She no longer follows with pulmonology.    Migraines: These occur 2 x month.  She is not sure what triggers them.  She takes sumatriptan  as needed with good relief of symptoms.  She does not follow with neurology.  GERD: Triggered by spicy and greasy foods.  She has not had an issue with this lately and is not currently taking any medications for this. There is no upper GI on file.  OA: Mainly in her knees.  She takes diclofenac  as needed with good relief of symptoms. She stopped getting steroid shots because they were ineffective. She plans to have a right knee replacement but she has to lose 50 lbs first. She follows with orthopedics.  OAB: She reports mainly urinary urgency.  She is not currently taking any medications for this. She does not wear incontinence pads. She does not follow with urology.  Anxiety: Chronic, managed on sertraline .  She is not currently seeing a therapist.  She denies depression, SI/HI.  HLD: Her last LDL was 68, triglycerides 893, 02/2024.  She denies myalgias on atorvastatin .  She tries to consume a low-fat diet.  DM 2: Her last A1c was 5.9 %, 02/2024.  She is taking metformin  and tirzepatide  as prescribed.  She does not check her sugars. Her last eye exam was 08/2024, [facility] [place]. Flu 07/2023. Pneumoivax 02/2023. Prevnar 20 never. Covid x2.    Review of Systems  Past Medical History:  Diagnosis Date   Asthma    Bronchitis    Migraine headache    Morbid (severe) obesity due to excess calories (HCC) 05/14/2024   bmi 46.41   Seasonal allergies     Current Outpatient Medications  Medication Sig Dispense Refill   albuterol  (VENTOLIN   HFA) 108 (90 Base) MCG/ACT inhaler INHALE 2 PUFFS INTO THE LUNGS EVERY 6 HOURS AS NEEDED FOR WHEEZING OR SHORTNESS OF BREATH. 8.5 g 2   atorvastatin  (LIPITOR) 20 MG tablet TAKE 1 TABLET BY MOUTH 3 TIMES A WEEK. (Patient taking differently: Take 20 mg by mouth every other day.) 45 tablet 1   diclofenac  (VOLTAREN ) 75 MG EC tablet TAKE 1 TABLET BY MOUTH 2 TIMES DAILY. 180 tablet 1   metFORMIN  (GLUCOPHAGE ) 500 MG tablet TAKE 1 TABLET (500 MG TOTAL) BY MOUTH DAILY WITH BREAKFAST. 90 tablet 0   montelukast  (SINGULAIR ) 10 MG tablet TAKE 1 TABLET (10 MG TOTAL) BY MOUTH AT BEDTIME. 90 tablet 1   sertraline  (ZOLOFT ) 25 MG tablet TAKE 1 TABLET (25 MG TOTAL) BY MOUTH DAILY. 90 tablet 1   SUMAtriptan  (IMITREX ) 25 MG tablet TAKE 1 TABLET BY MOUTH AT ONSET, MAY REPEAT IN 2 HOURS IF HEADACHE PERSISTS OR RECURS. 10 tablet 5   tirzepatide  (MOUNJARO ) 7.5 MG/0.5ML Pen Inject 7.5 mg into the skin once a week. 6 mL 1   No current facility-administered medications for this visit.    Allergies  Allergen Reactions   Codeine Sulfate Other (See Comments)    REACTION: Hives, can't breathe   Latex Hives    Family History  Problem Relation Age of Onset   Hypertension Mother    Diverticulitis Mother    Fibromyalgia Mother  Heart failure Mother    Uterine cancer Sister    Asthma Maternal Grandmother    Heart disease Maternal Grandmother    Emphysema Maternal Grandmother        smoked   Diabetes Maternal Uncle    Heart disease Maternal Uncle    Colon cancer Neg Hx    Esophageal cancer Neg Hx    Rectal cancer Neg Hx    Stomach cancer Neg Hx    Breast cancer Neg Hx     Social History   Socioeconomic History   Marital status: Married    Spouse name: Not on file   Number of children: 3   Years of education: Not on file   Highest education level: Not on file  Occupational History   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  Tobacco Use   Smoking status: Never   Smokeless tobacco: Never  Vaping  Use   Vaping status: Never Used  Substance and Sexual Activity   Alcohol use: No   Drug use: No   Sexual activity: Yes  Other Topics Concern   Not on file  Social History Narrative   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  Denies fever, malaise, fatigue.  HEENT: Denies eye pain, eye redness, ear pain, ringing in the ears, wax buildup, runny nose, nasal congestion, bloody nose, or sore throat. Respiratory: Denies difficulty breathing, shortness of breath, cough or sputum production.   Cardiovascular: Denies chest pain, chest tightness, palpitations or swelling in the hands or feet.  Gastrointestinal: Patient reports intermittent reflux.  Denies abdominal pain, bloating, constipation, diarrhea or blood in the stool.  GU: Patient reports urinary urgency.  Denies frequency, pain with urination, burning sensation, blood in urine, odor or discharge. Musculoskeletal: Patient reports knee pain.  Denies decrease in range of motion, difficulty with gait, muscle pain or joint swelling.  Skin: Denies redness, rashes, lesions or ulcercations.  Neurological: Denies dizziness, difficulty with memory, difficulty with speech or problems with balance and coordination.  Psych: Patient has a history of anxiety.  Denies depression, SI/HI.  No other specific complaints in a complete review of systems (except as listed in HPI above).     Objective:   Physical Exam  BP 118/82 (BP Location: Right Arm, Patient Position: Sitting, Cuff Size:  Large)   Ht 5' 2 (1.575 m)   Wt 252 lb (114.3 kg)   BMI 46.09 kg/m    Wt Readings from Last 3 Encounters:  05/14/24 262 lb (118.8 kg)  04/23/24 262 lb (118.8 kg)  03/07/24 267 lb (121.1 kg)    General: Appears her stated age, obese, in NAD. Skin: Warm, dry and intact.  No ulcerations noted. HEENT: Head: normal shape and size; Eyes: sclera white, no icterus, conjunctiva pink, PERRLA and EOMs intact;  Cardiovascular: Normal rate and rhythm. S1,S2 noted.  No murmur, rubs or gallops noted. No JVD or BLE edema. No carotid bruits noted. Pulmonary/Chest: Normal effort and positive vesicular breath sounds. No respiratory distress. No wheezes, rales  or ronchi noted.  Abdomen: Soft and nontender. Normal bowel sounds.  Musculoskeletal: Joint enlargement noted of bilateral knees.  Gait slow and steady without device. Neurological: Alert and oriented. Coordination normal.  Psychiatric: Mood and affect normal. Behavior is normal. Judgment and thought content normal.    BMET    Component Value Date/Time   NA 143 03/07/2024 1045   K 4.6 03/07/2024 1045   CL 105 03/07/2024 1045   CO2 30 03/07/2024 1045   GLUCOSE 104 03/07/2024 1045   BUN 21 03/07/2024 1045   CREATININE 0.86 03/07/2024 1045   CALCIUM  9.6 03/07/2024 1045   GFRNONAA 81.49 04/08/2010 0831   GFRAA 77 05/16/2007 0947    Lipid Panel     Component Value Date/Time   CHOL 127 03/07/2024 1045   TRIG 106 03/07/2024 1045   HDL 40 (L) 03/07/2024 1045   CHOLHDL 3.2 03/07/2024 1045   VLDL 52.4 (H) 01/06/2021 1244   LDLCALC 68 03/07/2024 1045    CBC    Component Value Date/Time   WBC 9.8 03/07/2024 1045   RBC 4.69 03/07/2024 1045   HGB 14.2 03/07/2024 1045   HCT 44.8 03/07/2024 1045   PLT 289 03/07/2024 1045   MCV 95.5 03/07/2024 1045   MCH 30.3 03/07/2024 1045   MCHC 31.7 (L) 03/07/2024 1045   RDW 13.3 03/07/2024 1045   LYMPHSABS 3.3 07/04/2013 0827   MONOABS 1.0 07/04/2013 0827   EOSABS 0.5 07/04/2013 0827    BASOSABS 0.1 07/04/2013 0827    Hgb A1C Lab Results  Component Value Date   HGBA1C 5.9 (H) 03/07/2024           Assessment & Plan:       RTC in 6 months for your annual exam Angeline Laura, NP

## 2024-09-18 NOTE — Assessment & Plan Note (Signed)
 Complicated by morbid obesity C-Met and lipid profile today Encouraged her to consume a low-fat diet Continue atorvastatin  20 mg daily, will adjust if needed based on labs to achieve LDL goal <70

## 2024-09-18 NOTE — Assessment & Plan Note (Signed)
 Try to identify and avoid triggers  Continue sumatriptan  25 mg daily as as needed

## 2024-09-18 NOTE — Assessment & Plan Note (Signed)
 Stable on her current dose of sertraline  25 mg daily Support offered

## 2024-09-18 NOTE — Patient Instructions (Signed)

## 2024-09-18 NOTE — Assessment & Plan Note (Signed)
 Complicated by morbid obesity Encourage kegel exercises and timed voiding Not medicated

## 2024-09-18 NOTE — Assessment & Plan Note (Signed)
 Complicated by morbid obesity A1c and urine microalbumin today Encouraged her to consume a low-carb diet and exercise for weight loss Consider stopping metformin  500 mg daily in a.m. and increasing Atrosept diet to 10 mg weekly pending labs Encouraged routine eye exam Encouraged routine foot exam Flu shot today

## 2024-09-19 ENCOUNTER — Ambulatory Visit: Payer: Self-pay | Admitting: Internal Medicine

## 2024-09-19 LAB — COMPREHENSIVE METABOLIC PANEL WITH GFR
AG Ratio: 1.4 (calc) (ref 1.0–2.5)
ALT: 18 U/L (ref 6–29)
AST: 12 U/L (ref 10–35)
Albumin: 4.1 g/dL (ref 3.6–5.1)
Alkaline phosphatase (APISO): 48 U/L (ref 37–153)
BUN/Creatinine Ratio: 19 (calc) (ref 6–22)
BUN: 23 mg/dL (ref 7–25)
CO2: 27 mmol/L (ref 20–32)
Calcium: 9.3 mg/dL (ref 8.6–10.4)
Chloride: 104 mmol/L (ref 98–110)
Creat: 1.2 mg/dL — ABNORMAL HIGH (ref 0.50–1.05)
Globulin: 2.9 g/dL (ref 1.9–3.7)
Glucose, Bld: 94 mg/dL (ref 65–99)
Potassium: 4.4 mmol/L (ref 3.5–5.3)
Sodium: 140 mmol/L (ref 135–146)
Total Bilirubin: 0.4 mg/dL (ref 0.2–1.2)
Total Protein: 7 g/dL (ref 6.1–8.1)
eGFR: 51 mL/min/1.73m2 — ABNORMAL LOW (ref >=60)

## 2024-09-19 LAB — HEMOGLOBIN A1C
Hgb A1c MFr Bld: 5.5 % (ref <5.7)
Mean Plasma Glucose: 111 mg/dL
eAG (mmol/L): 6.2 mmol/L

## 2024-09-19 LAB — LIPID PANEL
Cholesterol: 184 mg/dL (ref <200)
HDL: 37 mg/dL — ABNORMAL LOW (ref >=50)
LDL Cholesterol (Calc): 117 mg/dL — ABNORMAL HIGH
Non-HDL Cholesterol (Calc): 147 mg/dL — ABNORMAL HIGH (ref <130)
Total CHOL/HDL Ratio: 5 (calc) — ABNORMAL HIGH (ref <5.0)
Triglycerides: 187 mg/dL — ABNORMAL HIGH (ref <150)

## 2024-09-19 LAB — CBC
HCT: 45 % (ref 35.9–46.0)
Hemoglobin: 14.8 g/dL (ref 11.7–15.5)
MCH: 30.7 pg (ref 27.0–33.0)
MCHC: 32.9 g/dL (ref 31.6–35.4)
MCV: 93.4 fL (ref 81.4–101.7)
MPV: 12 fL (ref 7.5–12.5)
Platelets: 307 Thousand/uL (ref 140–400)
RBC: 4.82 Million/uL (ref 3.80–5.10)
RDW: 12.8 % (ref 11.0–15.0)
WBC: 11.7 Thousand/uL — ABNORMAL HIGH (ref 3.8–10.8)

## 2024-11-07 ENCOUNTER — Encounter: Payer: Self-pay | Admitting: Internal Medicine

## 2024-11-07 MED ORDER — TIRZEPATIDE 10 MG/0.5ML ~~LOC~~ SOAJ: 10.0000 mg | SUBCUTANEOUS | 0 refills | Status: AC

## 2024-11-07 MED ORDER — TIRZEPATIDE 10 MG/0.5ML ~~LOC~~ SOAJ: 10.0000 mg | SUBCUTANEOUS | 0 refills | Status: DC

## 2024-11-07 NOTE — Addendum Note (Signed)
 Addended by: ZELIA GAUZE D on: 11/07/2024 10:21 AM   Modules accepted: Orders

## 2024-11-21 NOTE — Progress Notes (Signed)
 Jamie Rowland                                          MRN: [id number]   11/21/2024   The [facility] Quality Team Specialist reviewed this patient medical record for the purposes of chart review for care gap closure. The following were reviewed: abstraction for care gap closure-glycemic status assessment.    [facility] Quality Team

## 2025-03-10 ENCOUNTER — Encounter: Admitting: Internal Medicine
